# Patient Record
Sex: Male | Born: 1965 | Race: Black or African American | Hispanic: No | Marital: Married | Smoking: Former smoker
Health system: Southern US, Community
[De-identification: ages and names within clinical notes are randomized; demographics above are authoritative.]

## PROBLEM LIST (undated history)

## (undated) DIAGNOSIS — C801 Malignant (primary) neoplasm, unspecified: Secondary | ICD-10-CM

## (undated) DIAGNOSIS — K0889 Other specified disorders of teeth and supporting structures: Secondary | ICD-10-CM

## (undated) DIAGNOSIS — G8929 Other chronic pain: Secondary | ICD-10-CM

## (undated) DIAGNOSIS — M545 Low back pain, unspecified: Secondary | ICD-10-CM

## (undated) DIAGNOSIS — Z789 Other specified health status: Secondary | ICD-10-CM

## (undated) HISTORY — PX: INGUINAL HERNIA REPAIR: SUR1180

---

## 1998-07-14 ENCOUNTER — Emergency Department (HOSPITAL_COMMUNITY): Admission: EM | Admit: 1998-07-14 | Discharge: 1998-07-14 | Payer: Self-pay | Admitting: Emergency Medicine

## 1999-05-09 ENCOUNTER — Emergency Department (HOSPITAL_COMMUNITY): Admission: EM | Admit: 1999-05-09 | Discharge: 1999-05-09 | Payer: Self-pay | Admitting: Emergency Medicine

## 1999-05-09 ENCOUNTER — Encounter: Payer: Self-pay | Admitting: Emergency Medicine

## 2001-06-03 ENCOUNTER — Emergency Department (HOSPITAL_COMMUNITY): Admission: EM | Admit: 2001-06-03 | Discharge: 2001-06-03 | Payer: Self-pay

## 2002-05-02 ENCOUNTER — Emergency Department (HOSPITAL_COMMUNITY): Admission: EM | Admit: 2002-05-02 | Discharge: 2002-05-02 | Payer: Self-pay | Admitting: Emergency Medicine

## 2004-01-08 ENCOUNTER — Ambulatory Visit (HOSPITAL_COMMUNITY): Admission: RE | Admit: 2004-01-08 | Discharge: 2004-01-08 | Payer: Self-pay

## 2004-08-22 ENCOUNTER — Emergency Department (HOSPITAL_COMMUNITY): Admission: EM | Admit: 2004-08-22 | Discharge: 2004-08-22 | Payer: Self-pay | Admitting: Emergency Medicine

## 2005-11-11 ENCOUNTER — Emergency Department (HOSPITAL_COMMUNITY): Admission: EM | Admit: 2005-11-11 | Discharge: 2005-11-11 | Payer: Self-pay | Admitting: Emergency Medicine

## 2006-03-20 ENCOUNTER — Emergency Department (HOSPITAL_COMMUNITY): Admission: EM | Admit: 2006-03-20 | Discharge: 2006-03-20 | Payer: Self-pay | Admitting: Emergency Medicine

## 2012-02-11 ENCOUNTER — Encounter (HOSPITAL_COMMUNITY): Payer: Self-pay | Admitting: *Deleted

## 2012-02-11 ENCOUNTER — Emergency Department (HOSPITAL_COMMUNITY): Payer: Self-pay

## 2012-02-11 ENCOUNTER — Emergency Department (HOSPITAL_COMMUNITY)
Admission: EM | Admit: 2012-02-11 | Discharge: 2012-02-11 | Disposition: A | Discharge status: Home / Self Care | Payer: Self-pay | Attending: Emergency Medicine | Admitting: Emergency Medicine

## 2012-02-11 DIAGNOSIS — F17200 Nicotine dependence, unspecified, uncomplicated: Secondary | ICD-10-CM | POA: Insufficient documentation

## 2012-02-11 DIAGNOSIS — Y9389 Activity, other specified: Secondary | ICD-10-CM | POA: Insufficient documentation

## 2012-02-11 DIAGNOSIS — S91309A Unspecified open wound, unspecified foot, initial encounter: Secondary | ICD-10-CM | POA: Insufficient documentation

## 2012-02-11 DIAGNOSIS — Y929 Unspecified place or not applicable: Secondary | ICD-10-CM | POA: Insufficient documentation

## 2012-02-11 DIAGNOSIS — W230XXA Caught, crushed, jammed, or pinched between moving objects, initial encounter: Secondary | ICD-10-CM | POA: Insufficient documentation

## 2012-02-11 DIAGNOSIS — Z23 Encounter for immunization: Secondary | ICD-10-CM | POA: Insufficient documentation

## 2012-02-11 DIAGNOSIS — S91319A Laceration without foreign body, unspecified foot, initial encounter: Secondary | ICD-10-CM

## 2012-02-11 MED ORDER — TETANUS-DIPHTH-ACELL PERTUSSIS 5-2.5-18.5 LF-MCG/0.5 IM SUSP
0.5000 mL | Freq: Once | INTRAMUSCULAR | Status: AC
  Administered 2012-02-11: 0.5 mL via INTRAMUSCULAR
  Filled 2012-02-11: qty 0.5

## 2012-02-11 MED ORDER — HYDROCODONE-ACETAMINOPHEN 5-325 MG PO TABS: 1.0000 | ORAL_TABLET | Freq: Four times a day (QID) | ORAL | Status: DC | PRN

## 2012-02-11 MED ORDER — BACITRACIN ZINC 500 UNIT/GM EX OINT
TOPICAL_OINTMENT | CUTANEOUS | Status: AC
  Filled 2012-02-11: qty 0.9

## 2012-02-11 MED ORDER — IBUPROFEN 800 MG PO TABS: 800.0000 mg | ORAL_TABLET | Freq: Three times a day (TID) | ORAL | Status: DC | PRN

## 2012-02-11 NOTE — ED Notes (Signed)
ZOX:[id number]<AV> Expected date:<BR> Expected time:<BR> Means of arrival:Ambulance<BR> Comments:<BR> Foot lac

## 2012-02-11 NOTE — ED Provider Notes (Signed)
Medical screening examination/treatment/procedure(s) were performed by non-physician practitioner and as supervising physician I was immediately available for consultation/collaboration.   Celene Kras, MD 02/11/12 [phone]

## 2012-02-11 NOTE — ED Notes (Signed)
PA at bedside.

## 2012-02-11 NOTE — ED Provider Notes (Signed)
History     CSN: [id number]  Arrival date & time 02/11/12  1931   First MD Initiated Contact with Patient 02/11/12 2012      Chief Complaint  Patient presents with  . Extremity Laceration   HPI  History provided by the patient and significant other. Patient is a 47 year old male with no significant PMH who presents with right foot injury and laceration. Injury occurred prior to arrival. Patient was working on a car and he thinks the car jack malfunctioned causing the car to fall. Patient states there was a heavy part of the axilla over his foot which required other people to help lift and removed. Following injury patient had a small circular puncture wound to the top of the foot with small amounts of bleeding. He denied having any weakness or numbness in the foot. He was ambulatory after the injury. Denies any other injury or complaint. Patient did not use any treatment for symptoms and came straight to the ER. Patient is unsure of his last tetanus shot.    History reviewed. No pertinent past medical history.  History reviewed. No pertinent past surgical history.  History reviewed. No pertinent family history.  History  Substance Use Topics  . Smoking status: Light Tobacco Smoker    Types: Cigarettes  . Smokeless tobacco: Never Used  . Alcohol Use: 8.4 oz/week    14 Cans of beer per week      Review of Systems  Neurological: Negative for weakness and numbness.  All other systems reviewed and are negative.    Allergies  Review of patient's allergies indicates no known allergies.  Home Medications  No current outpatient prescriptions on file.  BP 144/99  Pulse 69  Temp 98.6 F (37 C) (Oral)  Resp 20  SpO2 98%  Physical Exam  Nursing note and vitals reviewed. Constitutional: He is oriented to person, place, and time. He appears well-developed and well-nourished. No distress.  HENT:  Head: Normocephalic.  Cardiovascular: Normal rate and regular rhythm.     Pulmonary/Chest: Effort normal and breath sounds normal.  Musculoskeletal:       There is a 1 cm circular puncture type laceration to the proximal medial right dorsal foot. No active bleeding. There is some generalized swelling of the dorsal foot. No circumferential swelling. Normal movement in toes with normal sensation and cap refill. Normal dorsal pedal pulse. No gross deformities.  Neurological: He is alert and oriented to person, place, and time.  Skin: Skin is warm.  Psychiatric: He has a normal mood and affect. His behavior is normal.    ED Course  Procedures   LACERATION REPAIR Performed by: Angus Seller Authorized by: Angus Seller Consent: Verbal consent obtained. Risks and benefits: risks, benefits and alternatives were discussed Consent given by: patient Patient identity confirmed: provided demographic data Prepped and Draped in normal sterile fashion Wound explored  Laceration Location: Dorsal right foot  Laceration Length: 1 cm  No Foreign Bodies seen or palpated  Anesthesia: local infiltration  Local anesthetic: lidocaine 2% with epinephrine  Anesthetic total: 8 ml  Irrigation method: syringe Amount of cleaning: standard  Skin closure: Skin with 4-0 Prolene   Number of sutures: 5  Technique: Simple ruptured   Patient tolerance: Patient tolerated the procedure well with no immediate complications.     Dg Foot Complete Right  02/11/2012  *RADIOLOGY REPORT*  Clinical Data: Injury to the right foot. History of laceration.  RIGHT FOOT COMPLETE - 3+ VIEW  Comparison: None  Findings:  Three views of the right foot were obtained.  There is soft tissue edema along the medial aspect of the foot and ankle. Findings are compatible with history of laceration.  Degenerative changes along the dorsal aspect of the talonavicular joint.  There is no evidence for acute fracture or dislocation.  Mild degenerative changes involving the first MTP joint.  IMPRESSION: No  acute bony abnormality in the right foot.  Soft tissue swelling consistent with history of laceration.   Original Report Authenticated By: Richarda Overlie, M.D.      1. Laceration of foot       MDM  8:30 PM patient seen and evaluated. Patient currently resting comfortably denies any significant pains and uses pain medications at this time. Patient unsure of last tetanus and we will update today.        Angus Seller, [place] 02/11/12 2211

## 2012-02-11 NOTE — ED Notes (Signed)
Writer cleaned wound with normal saline, and gauze.
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
## 2012-02-23 ENCOUNTER — Encounter (HOSPITAL_COMMUNITY): Payer: Self-pay

## 2012-02-23 ENCOUNTER — Emergency Department (INDEPENDENT_AMBULATORY_CARE_PROVIDER_SITE_OTHER)
Admission: EM | Admit: 2012-02-23 | Discharge: 2012-02-23 | Disposition: A | Discharge status: Home / Self Care | Payer: Self-pay | Source: Home / Self Care | Attending: Family Medicine | Admitting: Family Medicine

## 2012-02-23 DIAGNOSIS — Z4802 Encounter for removal of sutures: Secondary | ICD-10-CM

## 2012-02-23 NOTE — ED Notes (Signed)
Removed four (4) additional sutures that pt found in/under scabbing.

## 2012-02-23 NOTE — ED Provider Notes (Signed)
History     CSN: [id number]  Arrival date & time 02/23/12  1733   First MD Initiated Contact with Patient 02/23/12 1745      Chief Complaint  Patient presents with  . Suture / Staple Removal    (Consider location/radiation/quality/duration/timing/severity/associated sxs/prior treatment) Patient is a 47 y.o. male presenting with suture removal. The history is provided by the patient.  Suture / Staple Removal  The sutures were placed 11 to 14 days ago. There has been no treatment since the wound repair. His temperature was unmeasured prior to arrival. There has been no drainage from the wound. There is no redness present. The pain has not changed. There is difficulty moving the extremity or digit due to pain.    History reviewed. No pertinent past medical history.  History reviewed. No pertinent past surgical history.  No family history on file.  History  Substance Use Topics  . Smoking status: Light Tobacco Smoker    Types: Cigarettes  . Smokeless tobacco: Never Used  . Alcohol Use: 8.4 oz/week    14 Cans of beer per week      Review of Systems  Constitutional: Negative.   Skin: Positive for wound.    Allergies  Review of patient's allergies indicates no known allergies.  Home Medications   Current Outpatient Rx  Name  Route  Sig  Dispense  Refill  . HYDROCODONE-ACETAMINOPHEN 5-325 MG PO TABS   Oral   Take 1 tablet by mouth every 6 (six) hours as needed for pain.   10 tablet   0   . IBUPROFEN 800 MG PO TABS   Oral   Take 1 tablet (800 mg total) by mouth every 8 (eight) hours as needed for pain.   30 tablet   0     There were no vitals taken for this visit.  Physical Exam  Nursing note and vitals reviewed. Constitutional: He appears well-developed and well-nourished.  Musculoskeletal: He exhibits tenderness.  Skin: Skin is warm and dry.       3 stitches removed, dsd    ED Course  Procedures (including critical care time)  Labs Reviewed - No  data to display No results found.   1. Visit for suture removal       MDM          Linna Hoff, MD 02/24/12 [phone]

## 2012-02-23 NOTE — ED Notes (Signed)
Suture removal right foot

## 2012-02-25 ENCOUNTER — Telehealth (HOSPITAL_COMMUNITY): Payer: Self-pay | Admitting: *Deleted
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
## 2012-07-01 ENCOUNTER — Emergency Department (HOSPITAL_BASED_OUTPATIENT_CLINIC_OR_DEPARTMENT_OTHER): Payer: No Typology Code available for payment source

## 2012-07-01 ENCOUNTER — Emergency Department (HOSPITAL_BASED_OUTPATIENT_CLINIC_OR_DEPARTMENT_OTHER)
Admission: EM | Admit: 2012-07-01 | Discharge: 2012-07-01 | Disposition: A | Discharge status: Home / Self Care | Payer: No Typology Code available for payment source | Attending: Emergency Medicine | Admitting: Emergency Medicine

## 2012-07-01 ENCOUNTER — Encounter (HOSPITAL_BASED_OUTPATIENT_CLINIC_OR_DEPARTMENT_OTHER): Payer: Self-pay | Admitting: *Deleted

## 2012-07-01 DIAGNOSIS — Y92410 Unspecified street and highway as the place of occurrence of the external cause: Secondary | ICD-10-CM | POA: Insufficient documentation

## 2012-07-01 DIAGNOSIS — M25561 Pain in right knee: Secondary | ICD-10-CM

## 2012-07-01 DIAGNOSIS — IMO0002 Reserved for concepts with insufficient information to code with codable children: Secondary | ICD-10-CM | POA: Insufficient documentation

## 2012-07-01 DIAGNOSIS — S8990XA Unspecified injury of unspecified lower leg, initial encounter: Secondary | ICD-10-CM | POA: Insufficient documentation

## 2012-07-01 DIAGNOSIS — Y9389 Activity, other specified: Secondary | ICD-10-CM | POA: Insufficient documentation

## 2012-07-01 DIAGNOSIS — F17200 Nicotine dependence, unspecified, uncomplicated: Secondary | ICD-10-CM | POA: Insufficient documentation

## 2012-07-01 DIAGNOSIS — M549 Dorsalgia, unspecified: Secondary | ICD-10-CM

## 2012-07-01 MED ORDER — CYCLOBENZAPRINE HCL 10 MG PO TABS: 10.0000 mg | ORAL_TABLET | Freq: Two times a day (BID) | ORAL | Status: DC | PRN

## 2012-07-01 NOTE — ED Notes (Signed)
Patient transported to X-ray 
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
## 2012-07-02 NOTE — ED Provider Notes (Signed)
History/physical exam/procedure(s) were performed by non-physician practitioner and as supervising physician I was immediately available for consultation/collaboration. I have reviewed all notes and am in agreement with care and plan.   Hilario Quarry, MD 07/02/12 [phone]

## 2015-09-30 ENCOUNTER — Emergency Department (HOSPITAL_COMMUNITY)
Admission: EM | Admit: 2015-09-30 | Discharge: 2015-09-30 | Disposition: A | Discharge status: Home / Self Care | Payer: Self-pay | Attending: Emergency Medicine | Admitting: Emergency Medicine

## 2015-09-30 ENCOUNTER — Encounter (HOSPITAL_COMMUNITY): Payer: Self-pay | Admitting: Emergency Medicine

## 2015-09-30 DIAGNOSIS — I10 Essential (primary) hypertension: Secondary | ICD-10-CM

## 2015-09-30 DIAGNOSIS — K047 Periapical abscess without sinus: Secondary | ICD-10-CM

## 2015-09-30 DIAGNOSIS — F17210 Nicotine dependence, cigarettes, uncomplicated: Secondary | ICD-10-CM | POA: Insufficient documentation

## 2015-09-30 MED ORDER — HYDROCODONE-ACETAMINOPHEN 5-325 MG PO TABS: 1.0000 | ORAL_TABLET | ORAL | 0 refills | Status: DC | PRN

## 2015-09-30 MED ORDER — HYDROCODONE-ACETAMINOPHEN 5-325 MG PO TABS
2.0000 | ORAL_TABLET | Freq: Once | ORAL | Status: AC
Start: 2015-09-30 — End: 2015-09-30
  Administered 2015-09-30: 2 via ORAL
  Filled 2015-09-30: qty 2

## 2015-09-30 MED ORDER — AMOXICILLIN 500 MG PO CAPS: 500.0000 mg | ORAL_CAPSULE | Freq: Three times a day (TID) | ORAL | 0 refills | Status: DC

## 2015-09-30 NOTE — ED Triage Notes (Signed)
Patient c/o right lower posterior tooth cracked and c/o pain since yesterday that isnt relieved by ibuprofen.

## 2015-09-30 NOTE — ED Provider Notes (Signed)
[facility] DEPT Provider Note   CSN: [id number] Arrival date & time: 09/30/15  1016     History   Chief Complaint Chief Complaint  Patient presents with  . Dental Pain    HPI Tanner Gallagher is a 50 y.o. male 2 days dental pain. Dental decay from fractured tooth  (lower R 1st premolar).  He has had intermittent pain in her tooth over the past 2 days has become severe. He states that he has radiating pain from the tooth into his jaw and face. He denies any difficulty swallowing. He has been taking Motrin without relief. He denies fevers or chills.   HPI  History reviewed. No pertinent past medical history.  There are no active problems to display for this patient.   Past Surgical History:  Procedure Laterality Date  . HERNIA REPAIR         Home Medications    Prior to Admission medications   Medication Sig Start Date End Date Taking? Authorizing Provider  amoxicillin (AMOXIL) 500 MG capsule Take 1 capsule (500 mg total) by mouth 3 (three) times daily. 09/30/15   Margarita Mail, PA-C  cyclobenzaprine (FLEXERIL) 10 MG tablet Take 1 tablet (10 mg total) by mouth 2 (two) times daily as needed for muscle spasms. 07/01/12   Jennifer Piepenbrink, PA-C  HYDROcodone-acetaminophen (NORCO) 5-325 MG tablet Take 1-2 tablets by mouth every 4 (four) hours as needed. 09/30/15   Margarita Mail, PA-C  ibuprofen (ADVIL,MOTRIN) 800 MG tablet Take 1 tablet (800 mg total) by mouth every 8 (eight) hours as needed for pain. 02/11/12   Hazel Sams, PA-C    Family History No family history on file.  Social History Social History  Substance Use Topics  . Smoking status: Light Tobacco Smoker    Types: Cigarettes  . Smokeless tobacco: Never Used  . Alcohol use 8.4 oz/week    14 Cans of beer per week     Allergies   Review of patient's allergies indicates no known allergies.   Review of Systems Review of Systems  Ten systems reviewed and are negative for acute change, except as noted  in the HPI.   Physical Exam Updated Vital Signs BP 153/100 (BP Location: Right Arm)   Pulse 63   Temp 98 F (36.7 C) (Oral)   Resp 17   SpO2 99%   Physical Exam  Constitutional: He appears well-developed and well-nourished. No distress.  HENT:  Head: Normocephalic and atraumatic.  Mouth/Throat: Oropharynx is clear and moist.  First premolar is decayed below the, lying, there is gingival erythema and swelling, exquisitely tender to palpation without fluctuance. Oropharynx is clear and moist  Eyes: Conjunctivae and EOM are normal. Pupils are equal, round, and reactive to light. No scleral icterus.  Neck: Normal range of motion. Neck supple.  Cardiovascular: Normal rate, regular rhythm and normal heart sounds.   Pulmonary/Chest: Effort normal and breath sounds normal. No respiratory distress.  Abdominal: Soft. There is no tenderness.  Musculoskeletal: He exhibits no edema.  Neurological: He is alert.  Skin: Skin is warm and dry. He is not diaphoretic.  Psychiatric: His behavior is normal.  Nursing note and vitals reviewed.    ED Treatments / Results  Labs (all labs ordered are listed, but only abnormal results are displayed) Labs Reviewed - No data to display  EKG  EKG Interpretation None       Radiology No results found.  Procedures Procedures (including critical care time)  Medications Ordered in ED Medications  HYDROcodone-acetaminophen (NORCO/VICODIN)  5-325 MG per tablet 2 tablet (not administered)     Initial Impression / Assessment and Plan / ED Course  I have reviewed the triage vital signs and the nursing notes.  Pertinent labs & imaging results that were available during my care of the patient were reviewed by me and considered in my medical decision making (see chart for details).  Clinical Course    Patient with toothache.  No gross abscess.  Exam unconcerning for Ludwig's angina or spread of infection.  Will treat with penicillin and pain  medicine.  Urged patient to follow-up with dentist.     Final Clinical Impressions(s) / ED Diagnoses   Final diagnoses:  Dental infection    New Prescriptions New Prescriptions   AMOXICILLIN (AMOXIL) 500 MG CAPSULE    Take 1 capsule (500 mg total) by mouth 3 (three) times daily.   HYDROCODONE-ACETAMINOPHEN (NORCO) 5-325 MG TABLET    Take 1-2 tablets by mouth every 4 (four) hours as needed.     Margarita Mail, PA-C 09/30/15 1554    Isla Pence, MD 10/03/15 [phone]

## 2015-09-30 NOTE — Discharge Instructions (Signed)
You have been seen by your caregiver because of dental pain.  °SEEK MEDICAL ATTENTION IF: °The exam and treatment you received today has been provided on an emergency basis only. This is not a substitute for complete medical or dental care. If your problem worsens or new symptoms (problems) appear, and you are unable to arrange prompt follow-up care with your dentist, call or return to this location. °CALL YOUR DENTIST OR RETURN IMMEDIATELY IF you develop a fever, rash, difficulty breathing or swallowing, neck or facial swelling, or other potentially serious concerns. ° ° °[employer]  °[employer]  °[facility]  °[address]  °[place], [place] [address]  °Phone [phone]  °[facility] [employer] [facility] in [place], [place], exemplifies the Dental School?s vision to improve the health and quality of life of all [place]ians by creating leaders with a passion to care for the underserved and by leading the nation in community-based, service learning oral health education. °We are committed to offering comprehensive general dental services for adults, children and special needs patients in a safe, caring and professional setting. ° °Appointments: Our clinic is open Monday through Friday 8:00 a.m. until 5:00 p.m. The amount of time scheduled for an appointment depends on the patient?s specific needs. We ask that you keep your appointed time for care or provide 24-hour notice of all appointment changes. Parents or legal guardians must accompany minor children. ° °Payment for Services: Medicaid and other insurance plans are welcome. Payment for services is due when services are rendered and may be made by cash or credit card. If you have dental insurance, we will assist you with your claim submission.  ° °Emergencies: Emergency services will be provided Monday through Friday on a  walk-in basis. Please arrive early for emergency services. After hours emergency services will be provided for patients of record as required. ° °Services:  °Comprehensive General Dentistry  °Children?s Dentistry  °Oral Surgery - Extractions  °Root Canals  °Sealants and Tooth Colored Fillings  °Crowns and Bridges  °Dentures and Partial Dentures  °Implant Services  °Periodontal Services and Cleanings  °Cosmetic Tooth Whitening  °Digital Radiography  °3-D/[facility] Beam Imaging ° ° °

## 2015-10-02 ENCOUNTER — Telehealth (HOSPITAL_COMMUNITY): Payer: Self-pay

## 2015-10-02 NOTE — Telephone Encounter (Signed)
Pt calling lost bottle of Amoxicillin for tooth infection has taken 6 doses (2 days of 10 days).  Rx for 8 days of Amoxicillin called to [facility] [phone] and given to [facility].

## 2016-06-23 ENCOUNTER — Emergency Department (HOSPITAL_COMMUNITY)
Admission: EM | Admit: 2016-06-23 | Discharge: 2016-06-23 | Disposition: A | Discharge status: Home / Self Care | Payer: Self-pay | Attending: Emergency Medicine | Admitting: Emergency Medicine

## 2016-06-23 ENCOUNTER — Encounter (HOSPITAL_COMMUNITY): Payer: Self-pay | Admitting: Emergency Medicine

## 2016-06-23 DIAGNOSIS — K0889 Other specified disorders of teeth and supporting structures: Secondary | ICD-10-CM | POA: Insufficient documentation

## 2016-06-23 DIAGNOSIS — F17210 Nicotine dependence, cigarettes, uncomplicated: Secondary | ICD-10-CM | POA: Insufficient documentation

## 2016-06-23 MED ORDER — PENICILLIN V POTASSIUM 500 MG PO TABS: 500.0000 mg | ORAL_TABLET | Freq: Four times a day (QID) | ORAL | 0 refills | Status: DC
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
## 2016-06-23 NOTE — ED Provider Notes (Signed)
[facility] DEPT Provider Note   CSN: [id number] Arrival date & time: 06/23/16  0145     History   Chief Complaint Chief Complaint  Patient presents with  . Dental Pain    HPI Tanner Gallagher is a 51 y.o. male.  Patient presents to the emergency department with a dental complaint. Symptoms began worsening 2-3 days ago, but he has had the broken teeth and pain for "a while." The patient has tried to alleviate pain with OTC meds.  Pain rated at a 10/10, characterized as throbbing in nature and located right upper rear molar, which is cracked and broken. Patient denies fever, night sweats, chills, difficulty swallowing or opening mouth, SOB, nuchal rigidity or decreased ROM of neck.  Patient does not have a dentist and requests a resource guide at discharge.    The history is provided by the patient. No language interpreter was used.    History reviewed. No pertinent past medical history.  There are no active problems to display for this patient.   Past Surgical History:  Procedure Laterality Date  . HERNIA REPAIR         Home Medications    Prior to Admission medications   Medication Sig Start Date End Date Taking? Authorizing Provider  amoxicillin (AMOXIL) 500 MG capsule Take 1 capsule (500 mg total) by mouth 3 (three) times daily. 09/30/15   Margarita Mail, PA-C  cyclobenzaprine (FLEXERIL) 10 MG tablet Take 1 tablet (10 mg total) by mouth 2 (two) times daily as needed for muscle spasms. 07/01/12   Piepenbrink, Anderson Malta, PA-C  HYDROcodone-acetaminophen (NORCO) 5-325 MG tablet Take 1-2 tablets by mouth every 4 (four) hours as needed. 09/30/15   Harris, Vernie Shanks, PA-C  ibuprofen (ADVIL,MOTRIN) 800 MG tablet Take 1 tablet (800 mg total) by mouth every 8 (eight) hours as needed for pain. 02/11/12   Hazel Sams, PA-C    Family History History reviewed. No pertinent family history.  Social History Social History  Substance Use Topics  . Smoking status: Light Tobacco Smoker      Types: Cigarettes  . Smokeless tobacco: Never Used  . Alcohol use 8.4 oz/week    14 Cans of beer per week     Allergies   Patient has no known allergies.   Review of Systems Review of Systems  Constitutional: Negative for chills and fever.  HENT: Positive for dental problem. Negative for drooling.   Neurological: Negative for speech difficulty.  Psychiatric/Behavioral: Positive for sleep disturbance.     Physical Exam Updated Vital Signs BP (!) 138/91 (BP Location: Left Arm)   Pulse 84   Temp 98 F (36.7 C) (Oral)   Resp 17   Ht 5\' 9"  (1.753 m)   Wt 70.3 kg (155 lb)   SpO2 100%   BMI 22.89 kg/m   Physical Exam Physical Exam  Constitutional: Pt appears well-developed and well-nourished.  HENT:  Head: Normocephalic.  Right Ear: Tympanic membrane, external ear and ear canal normal.  Left Ear: Tympanic membrane, external ear and ear canal normal.  Nose: Nose normal. Right sinus exhibits no maxillary sinus tenderness and no frontal sinus tenderness. Left sinus exhibits no maxillary sinus tenderness and no frontal sinus tenderness.  Mouth/Throat: Uvula is midline, oropharynx is clear and moist and mucous membranes are normal. No oral lesions. No uvula swelling or lacerations. No oropharyngeal exudate, posterior oropharyngeal edema, posterior oropharyngeal erythema or tonsillar abscesses.  Poor dentition No gingival swelling, fluctuance or induration No gross abscess  No sublingual edema, tenderness to  palpation, or sign of Ludwig's angina, or deep space infection Pain at right upper rear molars Eyes: Conjunctivae are normal. Pupils are equal, round, and reactive to light. Right eye exhibits no discharge. Left eye exhibits no discharge.  Neck: Normal range of motion. Neck supple.  No stridor Handling secretions without difficulty No nuchal rigidity No cervical lymphadenopathy Cardiovascular: Normal rate, regular rhythm and normal heart sounds.   Pulmonary/Chest:  Effort normal. No respiratory distress.  Equal chest rise  Abdominal: Soft. Bowel sounds are normal. Pt exhibits no distension. There is no tenderness.  Lymphadenopathy: Pt has no cervical adenopathy.  Neurological: Pt is alert and oriented x 4  Skin: Skin is warm and dry.  Psychiatric: Pt has a normal mood and affect.  Nursing note and vitals reviewed.    ED Treatments / Results  Labs (all labs ordered are listed, but only abnormal results are displayed) Labs Reviewed - No data to display  EKG  EKG Interpretation None       Radiology No results found.  Procedures Procedures (including critical care time)  Medications Ordered in ED Medications - No data to display   Initial Impression / Assessment and Plan / ED Course  I have reviewed the triage vital signs and the nursing notes.  Pertinent labs & imaging results that were available during my care of the patient were reviewed by me and considered in my medical decision making (see chart for details).     Patient with dentalgia.  No abscess requiring immediate incision and drainage.  Exam not concerning for Ludwig's angina or pharyngeal abscess.  Will treat with penicillin. Pt instructed to follow-up with dentist.  Discussed return precautions. Pt safe for discharge.   Final Clinical Impressions(s) / ED Diagnoses   Final diagnoses:  Pain, dental    New Prescriptions New Prescriptions   No medications on file     Montine Circle, Hershal Coria 06/23/16 Elisha Headland, MD 06/23/16 [phone]

## 2017-05-24 ENCOUNTER — Other Ambulatory Visit: Payer: Self-pay

## 2017-05-24 ENCOUNTER — Encounter (HOSPITAL_BASED_OUTPATIENT_CLINIC_OR_DEPARTMENT_OTHER): Payer: Self-pay | Admitting: Emergency Medicine

## 2017-05-24 ENCOUNTER — Emergency Department (HOSPITAL_BASED_OUTPATIENT_CLINIC_OR_DEPARTMENT_OTHER)
Admission: EM | Admit: 2017-05-24 | Discharge: 2017-05-24 | Disposition: A | Discharge status: Home / Self Care | Payer: Self-pay | Attending: Emergency Medicine | Admitting: Emergency Medicine

## 2017-05-24 DIAGNOSIS — K047 Periapical abscess without sinus: Secondary | ICD-10-CM | POA: Insufficient documentation

## 2017-05-24 DIAGNOSIS — F17210 Nicotine dependence, cigarettes, uncomplicated: Secondary | ICD-10-CM | POA: Insufficient documentation

## 2017-05-24 DIAGNOSIS — Z79899 Other long term (current) drug therapy: Secondary | ICD-10-CM | POA: Insufficient documentation

## 2017-05-24 MED ORDER — NAPROXEN 500 MG PO TABS: 500.0000 mg | ORAL_TABLET | Freq: Two times a day (BID) | ORAL | 0 refills | Status: DC

## 2017-05-24 MED ORDER — CLINDAMYCIN HCL 300 MG PO CAPS: 300.0000 mg | ORAL_CAPSULE | Freq: Four times a day (QID) | ORAL | 0 refills | Status: DC

## 2017-05-24 NOTE — ED Provider Notes (Signed)
[facility] EMERGENCY DEPARTMENT Provider Note   CSN: [id number] Arrival date & time: 05/24/17  1837     History   Chief Complaint Chief Complaint  Patient presents with  . Dental Pain    HPI Tanner Gallagher is a 52 y.o. male.   Dental Pain   This is a recurrent problem. The current episode started 2 days ago. The problem occurs constantly. The problem has been gradually worsening. The pain is moderate. He has tried acetaminophen for the symptoms. The treatment provided no relief.    History reviewed. No pertinent past medical history.  There are no active problems to display for this patient.   Past Surgical History:  Procedure Laterality Date  . HERNIA REPAIR          Home Medications    Prior to Admission medications   Medication Sig Start Date End Date Taking? Authorizing Provider  amoxicillin (AMOXIL) 500 MG capsule Take 1 capsule (500 mg total) by mouth 3 (three) times daily. 09/30/15   Harris, Vernie Shanks, PA-C  clindamycin (CLEOCIN) 300 MG capsule Take 1 capsule (300 mg total) by mouth 4 (four) times daily. X 7 days 05/24/17   Filip Luten, Corene Cornea, MD  cyclobenzaprine (FLEXERIL) 10 MG tablet Take 1 tablet (10 mg total) by mouth 2 (two) times daily as needed for muscle spasms. 07/01/12   Piepenbrink, Anderson Malta, PA-C  HYDROcodone-acetaminophen (NORCO) 5-325 MG tablet Take 1-2 tablets by mouth every 4 (four) hours as needed. 09/30/15   Harris, Vernie Shanks, PA-C  ibuprofen (ADVIL,MOTRIN) 800 MG tablet Take 1 tablet (800 mg total) by mouth every 8 (eight) hours as needed for pain. 02/11/12   Hazel Sams, PA-C  naproxen (NAPROSYN) 500 MG tablet Take 1 tablet (500 mg total) by mouth 2 (two) times daily. 05/24/17   Jakell Trusty, Corene Cornea, MD  penicillin v potassium (VEETID) 500 MG tablet Take 1 tablet (500 mg total) by mouth 4 (four) times daily. 06/23/16   Montine Circle, PA-C    Family History History reviewed. No pertinent family history.  Social History Social History   Tobacco Use    . Smoking status: Light Tobacco Smoker    Types: Cigarettes  . Smokeless tobacco: Never Used  Substance Use Topics  . Alcohol use: Yes    Alcohol/week: 8.4 oz    Types: 14 Cans of beer per week  . Drug use: Yes    Types: Marijuana     Allergies   Patient has no known allergies.   Review of Systems Review of Systems  All other systems reviewed and are negative.    Physical Exam Updated Vital Signs BP (!) 132/95 (BP Location: Left Arm)   Pulse 60   Temp 98.2 F (36.8 C) (Oral)   Resp 20   Ht 5\' 9"  (1.753 m)   Wt 70.3 kg (155 lb)   SpO2 100%   BMI 22.89 kg/m   Physical Exam  Constitutional: He is oriented to person, place, and time. He appears well-developed and well-nourished.  HENT:  Head: Normocephalic and atraumatic.  Left lower jaw swelling.  Normal salivation bilaterally No obvious airway involvement.  No sublingual firmness.  Eyes: Conjunctivae and EOM are normal.  Neck: Normal range of motion.  Cardiovascular: Normal rate.  Pulmonary/Chest: Effort normal. No respiratory distress.  Abdominal: Soft. Bowel sounds are normal. He exhibits no distension.  Musculoskeletal: Normal range of motion.  Neurological: He is alert and oriented to person, place, and time. No cranial nerve deficit. Coordination normal.  Skin: Skin is warm  and dry.  Nursing note and vitals reviewed.    ED Treatments / Results  Labs (all labs ordered are listed, but only abnormal results are displayed) Labs Reviewed - No data to display  EKG None  Radiology No results found.  Procedures Procedures (including critical care time)  Medications Ordered in ED Medications - No data to display   Initial Impression / Assessment and Plan / ED Course  I have reviewed the triage vital signs and the nursing notes.  Pertinent labs & imaging results that were available during my care of the patient were reviewed by me and considered in my medical decision making (see chart for  details).     Likely dental infection without e/o complication. Airway intact. Doubt ludwigs. No dysphagia. Will treat with abx/dental follow up.  Final Clinical Impressions(s) / ED Diagnoses   Final diagnoses:  Dental infection    ED Discharge Orders        Ordered    clindamycin (CLEOCIN) 300 MG capsule  4 times daily     05/24/17 1931    naproxen (NAPROSYN) 500 MG tablet  2 times daily     05/24/17 1931       Lanie Schelling, Corene Cornea, MD 05/24/17 2003

## 2017-05-24 NOTE — ED Triage Notes (Signed)
Patient states that he has had pain to his left lower jaw and states the he noted the swelling today

## 2017-06-12 ENCOUNTER — Other Ambulatory Visit: Payer: Self-pay

## 2017-06-12 ENCOUNTER — Emergency Department (HOSPITAL_BASED_OUTPATIENT_CLINIC_OR_DEPARTMENT_OTHER)
Admission: EM | Admit: 2017-06-12 | Discharge: 2017-06-12 | Disposition: A | Discharge status: Home / Self Care | Payer: Self-pay | Attending: Emergency Medicine | Admitting: Emergency Medicine

## 2017-06-12 ENCOUNTER — Encounter (HOSPITAL_BASED_OUTPATIENT_CLINIC_OR_DEPARTMENT_OTHER): Payer: Self-pay

## 2017-06-12 DIAGNOSIS — F17210 Nicotine dependence, cigarettes, uncomplicated: Secondary | ICD-10-CM | POA: Insufficient documentation

## 2017-06-12 DIAGNOSIS — Z79899 Other long term (current) drug therapy: Secondary | ICD-10-CM | POA: Insufficient documentation

## 2017-06-12 DIAGNOSIS — K6289 Other specified diseases of anus and rectum: Secondary | ICD-10-CM | POA: Insufficient documentation

## 2017-06-12 MED ORDER — BELLADONNA ALKALOIDS-OPIUM 16.2-60 MG RE SUPP: 1.0000 | Freq: Three times a day (TID) | RECTAL | 0 refills | Status: DC | PRN

## 2017-06-12 NOTE — ED Triage Notes (Signed)
Pt c/o rectal pain and increased pain with bowel movement, denies hemorrhoids, denies bleeding, denies abscess, states he thinks he pulled something

## 2017-06-12 NOTE — ED Provider Notes (Signed)
[facility] EMERGENCY DEPARTMENT Provider Note   CSN: [id number] Arrival date & time: 06/12/17  1748     History   Chief Complaint Chief Complaint  Patient presents with  . Rectal Pain    HPI Tanner Gallagher is a 52 y.o. male.  HPI Patient presents with rectal pain. Onset was a few days ago, and since onset the pain has been increasing, is severe, sharp, burning, worse with defecation, the present at all times. No abdominal pain, no nausea, vomiting, diarrhea, urinary changes, scrotal swelling. Patient had one prior episode in the distant past, which was noted to be a fissure, according to him. During this illness he has recently obtained topical lidocaine and has been using Epson salt baths since yesterday, without appreciable change in his condition. Patient denies rectal penetration with anything. History reviewed. No pertinent past medical history.  There are no active problems to display for this patient.   Past Surgical History:  Procedure Laterality Date  . HERNIA REPAIR          Home Medications    Prior to Admission medications   Medication Sig Start Date End Date Taking? Authorizing Provider  amoxicillin (AMOXIL) 500 MG capsule Take 1 capsule (500 mg total) by mouth 3 (three) times daily. 09/30/15   Harris, Vernie Shanks, PA-C  clindamycin (CLEOCIN) 300 MG capsule Take 1 capsule (300 mg total) by mouth 4 (four) times daily. X 7 days 05/24/17   Mesner, Corene Cornea, MD  cyclobenzaprine (FLEXERIL) 10 MG tablet Take 1 tablet (10 mg total) by mouth 2 (two) times daily as needed for muscle spasms. 07/01/12   Piepenbrink, Anderson Malta, PA-C  HYDROcodone-acetaminophen (NORCO) 5-325 MG tablet Take 1-2 tablets by mouth every 4 (four) hours as needed. 09/30/15   Harris, Vernie Shanks, PA-C  ibuprofen (ADVIL,MOTRIN) 800 MG tablet Take 1 tablet (800 mg total) by mouth every 8 (eight) hours as needed for pain. 02/11/12   Hazel Sams, PA-C  naproxen (NAPROSYN) 500 MG tablet Take 1 tablet (500  mg total) by mouth 2 (two) times daily. 05/24/17   Mesner, Corene Cornea, MD  opium-belladonna (B&O SUPPRETTES) 16.2-60 MG suppository Place 1 suppository rectally every 8 (eight) hours as needed (rectal pain). 06/12/17   Carmin Muskrat, MD  penicillin v potassium (VEETID) 500 MG tablet Take 1 tablet (500 mg total) by mouth 4 (four) times daily. 06/23/16   Montine Circle, PA-C    Family History No family history on file.  Social History Social History   Tobacco Use  . Smoking status: Light Tobacco Smoker    Types: Cigarettes  . Smokeless tobacco: Never Used  Substance Use Topics  . Alcohol use: Yes    Alcohol/week: 8.4 oz    Types: 14 Cans of beer per week  . Drug use: Yes    Types: Marijuana     Allergies   Patient has no known allergies.   Review of Systems Review of Systems  Constitutional:       Per HPI, otherwise negative  HENT:       Per HPI, otherwise negative  Respiratory:       Per HPI, otherwise negative  Cardiovascular:       Per HPI, otherwise negative  Gastrointestinal: Negative for vomiting.  Endocrine:       Negative aside from HPI  Genitourinary:       Neg aside from HPI   Musculoskeletal:       Per HPI, otherwise negative  Skin: Negative.   Neurological: Negative for syncope.  Physical Exam Updated Vital Signs BP 125/76 (BP Location: Left Arm)   Pulse 92   Temp 99.6 F (37.6 C) (Oral)   Resp 18   Ht 5\' 9"  (1.753 m)   Wt 70.3 kg (155 lb)   SpO2 100%   BMI 22.89 kg/m   Physical Exam  Constitutional: He is oriented to person, place, and time. He appears well-developed. No distress.  HENT:  Head: Normocephalic and atraumatic.  Eyes: Conjunctivae and EOM are normal.  Cardiovascular: Normal rate and regular rhythm.  Pulmonary/Chest: Effort normal. No stridor. No respiratory distress.  Abdominal: He exhibits no distension.  Genitourinary:     Musculoskeletal: He exhibits no edema.  Neurological: He is alert and oriented to person, place,  and time.  Skin: Skin is warm and dry.  Psychiatric: He has a normal mood and affect.  Nursing note and vitals reviewed.    ED Treatments / Results   Procedures Procedures (including critical care time)  Medications Ordered in ED Medications - No data to display   Initial Impression / Assessment and Plan / ED Course  I have reviewed the triage vital signs and the nursing notes.  Pertinent labs & imaging results that were available during my care of the patient were reviewed by me and considered in my medical decision making (see chart for details).    This generally well-appearing male with no history of rectal penetration presents with rectal pain for several days per Patient acknowledges recent heavy exertion, lifting objects, but has no obvious hemorrhoid. Patient has no obvious abscess, no obvious bleeding, discharge, drainage from his rectum. There is some suspicion for seizure, versus early infection, but no drainable lesions. Patient was encouraged to continue using Epson salt baths, lidocaine topical medication and BO suppository and follow-up with gastroenterology as needed.  Final Clinical Impressions(s) / ED Diagnoses   Final diagnoses:  Rectal pain    ED Discharge Orders        Ordered    opium-belladonna (B&O SUPPRETTES) 16.2-60 MG suppository  Every 8 hours PRN     06/12/17 1949       Carmin Muskrat, MD 06/12/17 1953

## 2017-06-12 NOTE — Discharge Instructions (Addendum)
As discussed, please continue using the lidocaine, as well as Epson salt baths for relief. In addition, please obtain the prescription for suppositories.  If your pain does not improve, please be sure to follow-up with our colleagues. Otherwise return here for concerning changes in your condition.

## 2017-06-13 ENCOUNTER — Telehealth (HOSPITAL_BASED_OUTPATIENT_CLINIC_OR_DEPARTMENT_OTHER): Payer: Self-pay | Admitting: *Deleted

## 2017-06-13 NOTE — Telephone Encounter (Signed)
Call received from [facility] out-patient pharmacy requesting notation be made on pt's chart. He received Rx for B&O suppositories #12 from Dr Vanita Panda. However he was only able to afford #6 and he forfeited the remaining number prescribed.

## 2017-06-14 ENCOUNTER — Telehealth (HOSPITAL_BASED_OUTPATIENT_CLINIC_OR_DEPARTMENT_OTHER): Payer: Self-pay | Admitting: Emergency Medicine

## 2017-06-16 ENCOUNTER — Observation Stay (HOSPITAL_COMMUNITY): Payer: Self-pay | Admitting: Certified Registered"

## 2017-06-16 ENCOUNTER — Emergency Department (HOSPITAL_COMMUNITY): Payer: Self-pay

## 2017-06-16 ENCOUNTER — Other Ambulatory Visit: Payer: Self-pay

## 2017-06-16 ENCOUNTER — Inpatient Hospital Stay (HOSPITAL_COMMUNITY)
Admission: EM | Admit: 2017-06-16 | Discharge: 2017-06-19 | DRG: 395 | Disposition: A | Discharge status: Home / Self Care | Payer: Self-pay | Attending: Surgery | Admitting: Surgery

## 2017-06-16 ENCOUNTER — Encounter (HOSPITAL_COMMUNITY): Payer: Self-pay | Admitting: Emergency Medicine

## 2017-06-16 ENCOUNTER — Encounter (HOSPITAL_COMMUNITY): Admission: EM | Disposition: A | Discharge status: Home / Self Care | Payer: Self-pay | Source: Home / Self Care

## 2017-06-16 DIAGNOSIS — F17210 Nicotine dependence, cigarettes, uncomplicated: Secondary | ICD-10-CM | POA: Diagnosis present

## 2017-06-16 DIAGNOSIS — Z791 Long term (current) use of non-steroidal anti-inflammatories (NSAID): Secondary | ICD-10-CM

## 2017-06-16 DIAGNOSIS — K6289 Other specified diseases of anus and rectum: Secondary | ICD-10-CM | POA: Diagnosis present

## 2017-06-16 DIAGNOSIS — K611 Rectal abscess: Secondary | ICD-10-CM | POA: Diagnosis present

## 2017-06-16 DIAGNOSIS — K612 Anorectal abscess: Principal | ICD-10-CM | POA: Diagnosis present

## 2017-06-16 DIAGNOSIS — K769 Liver disease, unspecified: Secondary | ICD-10-CM | POA: Diagnosis present

## 2017-06-16 HISTORY — DX: Other specified health status: Z78.9

## 2017-06-16 HISTORY — PX: INCISION AND DRAINAGE PERIRECTAL ABSCESS: SHX1804

## 2017-06-16 HISTORY — PX: PROCTOSCOPY: SHX2266

## 2017-06-16 LAB — URINALYSIS, ROUTINE W REFLEX MICROSCOPIC
Bilirubin Urine: NEGATIVE
Glucose, UA: NEGATIVE mg/dL
Ketones, ur: 20 mg/dL — AB
NITRITE: POSITIVE — AB
PROTEIN: NEGATIVE mg/dL
Specific Gravity, Urine: 1.025 (ref 1.005–1.030)
WBC, UA: >50 WBC/hpf — ABNORMAL HIGH (ref 0–5)
pH: 6 (ref 5.0–8.0)

## 2017-06-16 LAB — CBC WITH DIFFERENTIAL/PLATELET
Abs Immature Granulocytes: 0.1 10*3/uL (ref 0.0–0.1)
Basophils Absolute: 0 10*3/uL (ref 0.0–0.1)
Basophils Relative: 0 %
EOS ABS: 0 10*3/uL (ref 0.0–0.7)
EOS PCT: 0 %
HEMATOCRIT: 38.2 % — AB (ref 39.0–52.0)
Hemoglobin: 13.2 g/dL (ref 13.0–17.0)
Immature Granulocytes: 1 %
LYMPHS ABS: 1.8 10*3/uL (ref 0.7–4.0)
LYMPHS PCT: 9 %
MCH: 31.1 pg (ref 26.0–34.0)
MCHC: 34.6 g/dL (ref 30.0–36.0)
MCV: 90.1 fL (ref 78.0–100.0)
MONO ABS: 2.2 10*3/uL — AB (ref 0.1–1.0)
MONOS PCT: 11 %
Neutro Abs: 16.8 10*3/uL — ABNORMAL HIGH (ref 1.7–7.7)
Neutrophils Relative %: 79 %
Platelets: 360 10*3/uL (ref 150–400)
RBC: 4.24 MIL/uL (ref 4.22–5.81)
RDW: 13.4 % (ref 11.5–15.5)
WBC: 21 10*3/uL — ABNORMAL HIGH (ref 4.0–10.5)

## 2017-06-16 LAB — I-STAT CG4 LACTIC ACID, ED: LACTIC ACID, VENOUS: 1.53 mmol/L (ref 0.5–1.9)

## 2017-06-16 LAB — COMPREHENSIVE METABOLIC PANEL
ALBUMIN: 2.6 g/dL — AB (ref 3.5–5.0)
ALT: 14 U/L — AB (ref 17–63)
AST: 23 U/L (ref 15–41)
Alkaline Phosphatase: 78 U/L (ref 38–126)
Anion gap: 10 (ref 5–15)
BUN: 10 mg/dL (ref 6–20)
CHLORIDE: 96 mmol/L — AB (ref 101–111)
CO2: 26 mmol/L (ref 22–32)
CREATININE: 0.97 mg/dL (ref 0.61–1.24)
Calcium: 8.7 mg/dL — ABNORMAL LOW (ref 8.9–10.3)
GFR calc Af Amer: >60 mL/min (ref >60)
GLUCOSE: 102 mg/dL — AB (ref 65–99)
POTASSIUM: 4.2 mmol/L (ref 3.5–5.1)
Sodium: 132 mmol/L — ABNORMAL LOW (ref 135–145)
Total Bilirubin: 0.7 mg/dL (ref 0.3–1.2)
Total Protein: 7.2 g/dL (ref 6.5–8.1)

## 2017-06-16 SURGERY — INCISION AND DRAINAGE, ABSCESS, PERIRECTAL
Anesthesia: General

## 2017-06-16 MED ORDER — DIPHENHYDRAMINE HCL 50 MG/ML IJ SOLN: 25.0000 mg | Freq: Four times a day (QID) | INTRAMUSCULAR | Status: DC | PRN

## 2017-06-16 MED ORDER — PIPERACILLIN-TAZOBACTAM 3.375 G IVPB
3.3750 g | Freq: Three times a day (TID) | INTRAVENOUS | Status: DC
  Administered 2017-06-16 – 2017-06-18 (×5): 3.375 g via INTRAVENOUS
  Filled 2017-06-16 (×7): qty 50

## 2017-06-16 MED ORDER — ONDANSETRON 4 MG PO TBDP: 4.0000 mg | ORAL_TABLET | Freq: Four times a day (QID) | ORAL | Status: DC | PRN

## 2017-06-16 MED ORDER — POTASSIUM CHLORIDE IN NACL 20-0.9 MEQ/L-% IV SOLN
INTRAVENOUS | Status: DC
  Administered 2017-06-16 – 2017-06-17 (×2): via INTRAVENOUS
  Filled 2017-06-16 (×3): qty 1000

## 2017-06-16 MED ORDER — DIPHENHYDRAMINE HCL 25 MG PO CAPS: 25.0000 mg | ORAL_CAPSULE | Freq: Four times a day (QID) | ORAL | Status: DC | PRN

## 2017-06-16 MED ORDER — MIDAZOLAM HCL 2 MG/2ML IJ SOLN
INTRAMUSCULAR | Status: DC | PRN
  Administered 2017-06-16: 2 mg via INTRAVENOUS

## 2017-06-16 MED ORDER — ONDANSETRON HCL 4 MG/2ML IJ SOLN
INTRAMUSCULAR | Status: AC
  Filled 2017-06-16: qty 2

## 2017-06-16 MED ORDER — KETOROLAC TROMETHAMINE 30 MG/ML IJ SOLN
INTRAMUSCULAR | Status: AC
  Filled 2017-06-16: qty 1

## 2017-06-16 MED ORDER — VANCOMYCIN HCL IN DEXTROSE 1-5 GM/200ML-% IV SOLN: 1000.0000 mg | Freq: Once | INTRAVENOUS | Status: DC

## 2017-06-16 MED ORDER — BUPIVACAINE-EPINEPHRINE (PF) 0.5% -1:200000 IJ SOLN
INTRAMUSCULAR | Status: AC
  Filled 2017-06-16: qty 30

## 2017-06-16 MED ORDER — VANCOMYCIN HCL 10 G IV SOLR
1500.0000 mg | Freq: Once | INTRAVENOUS | Status: AC
  Administered 2017-06-16: 1500 mg via INTRAVENOUS
  Filled 2017-06-16: qty 1500

## 2017-06-16 MED ORDER — OXYCODONE HCL 5 MG PO TABS
5.0000 mg | ORAL_TABLET | ORAL | Status: DC | PRN
  Administered 2017-06-17: 10 mg via ORAL
  Administered 2017-06-17: 5 mg via ORAL
  Administered 2017-06-17: 10 mg via ORAL
  Administered 2017-06-17 (×2): 5 mg via ORAL
  Administered 2017-06-18 – 2017-06-19 (×5): 10 mg via ORAL
  Filled 2017-06-16 (×2): qty 2
  Filled 2017-06-16 (×4): qty 1
  Filled 2017-06-16 (×5): qty 2
  Filled 2017-06-16: qty 1

## 2017-06-16 MED ORDER — IOHEXOL 300 MG/ML  SOLN
100.0000 mL | Freq: Once | INTRAMUSCULAR | Status: AC | PRN
  Administered 2017-06-16: 100 mL via INTRAVENOUS

## 2017-06-16 MED ORDER — PIPERACILLIN-TAZOBACTAM 3.375 G IVPB 30 MIN
3.3750 g | Freq: Once | INTRAVENOUS | Status: AC
  Administered 2017-06-16: 3.375 g via INTRAVENOUS
  Filled 2017-06-16: qty 50

## 2017-06-16 MED ORDER — ACETAMINOPHEN 325 MG PO TABS: 650.0000 mg | ORAL_TABLET | Freq: Four times a day (QID) | ORAL | Status: DC | PRN

## 2017-06-16 MED ORDER — ONDANSETRON HCL 4 MG/2ML IJ SOLN
4.0000 mg | INTRAMUSCULAR | Status: AC
  Administered 2017-06-16: 4 mg via INTRAVENOUS
  Filled 2017-06-16: qty 2

## 2017-06-16 MED ORDER — ONDANSETRON HCL 4 MG/2ML IJ SOLN: 4.0000 mg | Freq: Four times a day (QID) | INTRAMUSCULAR | Status: DC | PRN

## 2017-06-16 MED ORDER — ACETAMINOPHEN 650 MG RE SUPP: 650.0000 mg | Freq: Four times a day (QID) | RECTAL | Status: DC | PRN

## 2017-06-16 MED ORDER — ONDANSETRON HCL 4 MG/2ML IJ SOLN
INTRAMUSCULAR | Status: DC | PRN
  Administered 2017-06-16: 4 mg via INTRAVENOUS

## 2017-06-16 MED ORDER — 0.9 % SODIUM CHLORIDE (POUR BTL) OPTIME
TOPICAL | Status: DC | PRN
  Administered 2017-06-16: 1000 mL

## 2017-06-16 MED ORDER — FENTANYL CITRATE (PF) 100 MCG/2ML IJ SOLN
100.0000 ug | Freq: Once | INTRAMUSCULAR | Status: AC
  Administered 2017-06-16: 100 ug via INTRAVENOUS
  Filled 2017-06-16: qty 2

## 2017-06-16 MED ORDER — VANCOMYCIN HCL IN DEXTROSE 1-5 GM/200ML-% IV SOLN
1000.0000 mg | Freq: Two times a day (BID) | INTRAVENOUS | Status: DC
  Administered 2017-06-17 – 2017-06-18 (×3): 1000 mg via INTRAVENOUS
  Filled 2017-06-16 (×3): qty 200

## 2017-06-16 MED ORDER — FENTANYL CITRATE (PF) 250 MCG/5ML IJ SOLN
INTRAMUSCULAR | Status: AC
  Filled 2017-06-16: qty 5

## 2017-06-16 MED ORDER — DEXAMETHASONE SODIUM PHOSPHATE 10 MG/ML IJ SOLN
INTRAMUSCULAR | Status: DC | PRN
  Administered 2017-06-16: 10 mg via INTRAVENOUS

## 2017-06-16 MED ORDER — PROPOFOL 10 MG/ML IV BOLUS
INTRAVENOUS | Status: DC | PRN
  Administered 2017-06-16: 150 mg via INTRAVENOUS
  Administered 2017-06-16: 50 mg via INTRAVENOUS
  Administered 2017-06-16: 100 mg via INTRAVENOUS

## 2017-06-16 MED ORDER — BUPIVACAINE-EPINEPHRINE 0.5% -1:200000 IJ SOLN
INTRAMUSCULAR | Status: DC | PRN
  Administered 2017-06-16: 30 mL

## 2017-06-16 MED ORDER — PROPOFOL 10 MG/ML IV BOLUS
INTRAVENOUS | Status: AC
  Filled 2017-06-16: qty 20

## 2017-06-16 MED ORDER — MIDAZOLAM HCL 2 MG/2ML IJ SOLN
INTRAMUSCULAR | Status: AC
  Filled 2017-06-16: qty 2

## 2017-06-16 MED ORDER — DEXAMETHASONE SODIUM PHOSPHATE 10 MG/ML IJ SOLN
INTRAMUSCULAR | Status: AC
  Filled 2017-06-16: qty 1

## 2017-06-16 MED ORDER — LACTATED RINGERS IV SOLN
INTRAVENOUS | Status: DC
Start: 2017-06-16 — End: 2017-06-19
  Administered 2017-06-16: 17:00:00 via INTRAVENOUS

## 2017-06-16 MED ORDER — FENTANYL CITRATE (PF) 250 MCG/5ML IJ SOLN
INTRAMUSCULAR | Status: DC | PRN
  Administered 2017-06-16: 100 ug via INTRAVENOUS
  Administered 2017-06-16: 150 ug via INTRAVENOUS

## 2017-06-16 MED ORDER — MORPHINE SULFATE (PF) 4 MG/ML IV SOLN
2.0000 mg | INTRAVENOUS | Status: DC | PRN
  Administered 2017-06-17: 4 mg via INTRAVENOUS
  Filled 2017-06-16 (×2): qty 1

## 2017-06-16 MED ORDER — KETOROLAC TROMETHAMINE 30 MG/ML IJ SOLN
INTRAMUSCULAR | Status: DC | PRN
  Administered 2017-06-16: 30 mg via INTRAVENOUS

## 2017-06-16 MED ORDER — SODIUM CHLORIDE 0.9 % IV BOLUS
2000.0000 mL | Freq: Once | INTRAVENOUS | Status: AC
  Administered 2017-06-16: 2000 mL via INTRAVENOUS

## 2017-06-16 MED ORDER — LIDOCAINE 2% (20 MG/ML) 5 ML SYRINGE
INTRAMUSCULAR | Status: DC | PRN
  Administered 2017-06-16: 80 mg via INTRAVENOUS

## 2017-06-16 SURGICAL SUPPLY — 37 items
BLADE SURG 15 STRL LF DISP TIS (BLADE) IMPLANT
BLADE SURG 15 STRL SS (BLADE) ×4
CANISTER SUCT 3000ML PPV (MISCELLANEOUS) ×4 IMPLANT
COVER SURGICAL LIGHT HANDLE (MISCELLANEOUS) ×4 IMPLANT
DRAIN PENROSE 1/2X12 LTX STRL (WOUND CARE) ×2 IMPLANT
DRAPE UTILITY XL STRL (DRAPES) ×6 IMPLANT
DRSG PAD ABDOMINAL 8X10 ST (GAUZE/BANDAGES/DRESSINGS) ×4 IMPLANT
ELECT CAUTERY BLADE 6.4 (BLADE) ×4 IMPLANT
ELECT REM PT RETURN 9FT ADLT (ELECTROSURGICAL) ×4
ELECTRODE REM PT RTRN 9FT ADLT (ELECTROSURGICAL) ×2 IMPLANT
GAUZE PACKING IODOFORM 1 (PACKING) IMPLANT
GAUZE SPONGE 4X4 12PLY STRL (GAUZE/BANDAGES/DRESSINGS) ×4 IMPLANT
GEL ULTRASOUND 20GR AQUASONIC (MISCELLANEOUS) ×2 IMPLANT
GLOVE SURG SIGNA 7.5 PF LTX (GLOVE) ×4 IMPLANT
GOWN STRL REUS W/ TWL LRG LVL3 (GOWN DISPOSABLE) ×2 IMPLANT
GOWN STRL REUS W/ TWL XL LVL3 (GOWN DISPOSABLE) ×2 IMPLANT
GOWN STRL REUS W/TWL LRG LVL3 (GOWN DISPOSABLE) ×4
GOWN STRL REUS W/TWL XL LVL3 (GOWN DISPOSABLE) ×4
KIT BASIN OR (CUSTOM PROCEDURE TRAY) ×4 IMPLANT
KIT TURNOVER KIT B (KITS) ×4 IMPLANT
NS IRRIG 1000ML POUR BTL (IV SOLUTION) ×4 IMPLANT
PACK LITHOTOMY IV (CUSTOM PROCEDURE TRAY) ×4 IMPLANT
PAD ARMBOARD 7.5X6 YLW CONV (MISCELLANEOUS) ×4 IMPLANT
PENCIL BUTTON HOLSTER BLD 10FT (ELECTRODE) ×4 IMPLANT
SPONGE LAP 18X18 X RAY DECT (DISPOSABLE) ×4 IMPLANT
SUT SILK 2 0 SH (SUTURE) ×2 IMPLANT
SWAB COLLECTION DEVICE MRSA (MISCELLANEOUS) ×2 IMPLANT
SWAB CULTURE ESWAB REG 1ML (MISCELLANEOUS) ×2 IMPLANT
SYR BULB 3OZ (MISCELLANEOUS) ×4 IMPLANT
TAPE CLOTH SURG 4X10 WHT LF (GAUZE/BANDAGES/DRESSINGS) ×2 IMPLANT
TOWEL OR 17X24 6PK STRL BLUE (TOWEL DISPOSABLE) ×2 IMPLANT
TOWEL OR 17X26 10 PK STRL BLUE (TOWEL DISPOSABLE) ×4 IMPLANT
TRAY PROCTOSCOPIC FIBER OPTIC (SET/KITS/TRAYS/PACK) ×2 IMPLANT
TUBE CONNECTING 12'X1/4 (SUCTIONS) ×1
TUBE CONNECTING 12X1/4 (SUCTIONS) ×3 IMPLANT
UNDERPAD 30X30 (UNDERPADS AND DIAPERS) ×4 IMPLANT
YANKAUER SUCT BULB TIP NO VENT (SUCTIONS) ×4 IMPLANT

## 2017-06-16 NOTE — Anesthesia Preprocedure Evaluation (Addendum)
Anesthesia Evaluation  Patient identified by MRN, date of birth, ID band Patient awake    Reviewed: Allergy & Precautions, H&P , NPO status , Patient's Chart, lab work & pertinent test results  Airway Mallampati: II  TM Distance: >3 FB Neck ROM: Full    Dental no notable dental hx. (+) Teeth Intact, Dental Advisory Given, Chipped, Missing,    Pulmonary Current Smoker,    Pulmonary exam normal breath sounds clear to auscultation       Cardiovascular negative cardio ROS   Rhythm:Regular Rate:Normal     Neuro/Psych negative neurological ROS  negative psych ROS   GI/Hepatic negative GI ROS, Neg liver ROS,   Endo/Other  negative endocrine ROS  Renal/GU negative Renal ROS  negative genitourinary   Musculoskeletal   Abdominal   Peds  Hematology negative hematology ROS (+)   Anesthesia Other Findings   Reproductive/Obstetrics negative OB ROS                           Anesthesia Physical Anesthesia Plan  ASA: II  Anesthesia Plan: General   Post-op Pain Management:    Induction: Intravenous  PONV Risk Score and Plan: 2 and Ondansetron, Dexamethasone and Midazolam  Airway Management Planned: LMA  Additional Equipment:   Intra-op Plan:   Post-operative Plan: Extubation in OR  Informed Consent: I have reviewed the patients History and Physical, chart, labs and discussed the procedure including the risks, benefits and alternatives for the proposed anesthesia with the patient or authorized representative who has indicated his/her understanding and acceptance.   Dental advisory given  Plan Discussed with: CRNA  Anesthesia Plan Comments:        Anesthesia Quick Evaluation

## 2017-06-16 NOTE — Consult Note (Deleted)
Reason for Consult:Rectal pain CC:  Severe sharpe burning pain with defication Referring Physician:   [name] is an 52 y.o. male.  HPI: Pt with a history of rectal fissure. He started having pain about 5 days ago. He was seen in the ED at [facility] 06/13/17.  "No gross hemorrhoid, tender to palpation posterior anus, without appreciable mass, lesion, abscess.   There is a small tear in the tissue, less than 1 cm."  He was treated with B&O suppositories and discharged.  He could not get the suppositories till the next day, and he stayed in bed after starting them.  By Sunday 5/27 he had more swelling.  Pt returns to ED here at [facility] with rectal pain and swelling.  He notes suppositories made it worse, he has used 3 of them.  He is unable to sit down, no bleeding. He cannot sit down now and there is now drainage from his rectum that he describes as purulent.  He cannot eat secondary to the pain, urine output is reported to be down and he has never had anything like this before.  Last meal was yesterday.  He had a rectal fissure diagnosis by someone here at [facility] 10-12 years ago.   Work up in the ED shows he a afebrile, BP is in the low normal range.  WBC is 21K, H/H OK.  Na 132, creatinine is normal. Lactate is normal.  CT scan shows what looks like a 6 x 2 cm abscess on the right with air in the fluid collection.  We are ask to see.  CT results:  Large circumferential perianal abscess with gas extending superiorly on the right more than left to the level of the pelvic floor. The abscess measures up to 8 cm. The underlying anus is distorted with mass not excluded. 2. 13 mm nonspecific lesion in the right liver. Abscess is a consideration given #1. If a mass is discovered at exam metastasis would be the primary concern.   History reviewed. No pertinent past medical history.  Past Surgical History:  Procedure Laterality Date  . HERNIA REPAIR      No family history on file.  Social History:  reports  that he has been smoking cigarettes.  He has never used smokeless tobacco. He reports that he drinks about 8.4 oz of alcohol per week. He reports that he has current or past drug history.  Drugs: Marijuana almost daily ETOH:Social Cigarettes/Tobaccco:  <1PPD since age 33 Married, wife is with him, He is a Curator.    Allergies: No Known Allergies  Medications:  Prior to Admission:  Prior to Admission medications   Medication Sig Start Date End Date Taking? Authorizing Provider  acetaminophen (TYLENOL) 500 MG tablet Take 1,000 mg by mouth every 6 (six) hours as needed.   Yes [provider]  naproxen (NAPROSYN) 500 MG tablet Take 1 tablet (500 mg total) by mouth 2 (two) times daily. 05/24/17  Yes Mesner, Corene Cornea, MD  opium-belladonna (B&O SUPPRETTES) 16.2-60 MG suppository Place 1 suppository rectally every 8 (eight) hours as needed (rectal pain). 06/12/17  Yes Carmin Muskrat, MD  amoxicillin (AMOXIL) 500 MG capsule Take 1 capsule (500 mg total) by mouth 3 (three) times daily. Patient not taking: Reported on 06/16/2017 09/30/15   Margarita Mail, PA-C  clindamycin (CLEOCIN) 300 MG capsule Take 1 capsule (300 mg total) by mouth 4 (four) times daily. X 7 days Patient not taking: Reported on 06/16/2017 05/24/17   Mesner, Corene Cornea, MD  cyclobenzaprine [facility])  10 MG tablet Take 1 tablet (10 mg total) by mouth 2 (two) times daily as needed for muscle spasms. Patient not taking: Reported on 06/16/2017 07/01/12   Piepenbrink, Anderson Malta, PA-C  HYDROcodone-acetaminophen (NORCO) 5-325 MG tablet Take 1-2 tablets by mouth every 4 (four) hours as needed. Patient not taking: Reported on 06/16/2017 09/30/15   Margarita Mail, PA-C  ibuprofen (ADVIL,MOTRIN) 800 MG tablet Take 1 tablet (800 mg total) by mouth every 8 (eight) hours as needed for pain. Patient not taking: Reported on 06/16/2017 02/11/12   Hazel Sams, PA-C  penicillin v potassium (VEETID) 500 MG tablet Take 1 tablet (500 mg total) by mouth 4 (four)  times daily. Patient not taking: Reported on 06/16/2017 06/23/16   Montine Circle, PA-C    Scheduled: . fentaNYL (SUBLIMAZE) injection  100 mcg Intravenous Once   Continuous: . piperacillin-tazobactam (ZOSYN)  IV    . vancomycin 1,500 mg (06/16/17 1345)  . [START ON 06/17/2017] vancomycin     Anti-infectives (From admission, onward)   Start     Dose/Rate Route Frequency Ordered Stop   06/17/17 0300  vancomycin (VANCOCIN) IVPB 1000 mg/200 mL premix     1,000 mg 200 mL/hr over 60 Minutes Intravenous Every 12 hours 06/16/17 1504     06/16/17 2000  piperacillin-tazobactam (ZOSYN) IVPB 3.375 g     3.375 g 12.5 mL/hr over 240 Minutes Intravenous Every 8 hours 06/16/17 1504     06/16/17 1530  vancomycin (VANCOCIN) 1,500 mg in sodium chloride 0.9 % 500 mL IVPB     1,500 mg 250 mL/hr over 120 Minutes Intravenous  Once 06/16/17 1300     06/16/17 1300  piperacillin-tazobactam (ZOSYN) IVPB 3.375 g     3.375 g 100 mL/hr over 30 Minutes Intravenous  Once 06/16/17 1255 06/16/17 1416   06/16/17 1300  vancomycin (VANCOCIN) IVPB 1000 mg/200 mL premix  Status:  Discontinued     1,000 mg 200 mL/hr over 60 Minutes Intravenous  Once 06/16/17 1255 06/16/17 1300      Results for orders placed or performed during the hospital encounter of 06/16/17 (from the past 48 hour(s))  Comprehensive metabolic panel     Status: Abnormal   Collection Time: 06/16/17 12:55 PM  Result Value Ref Range   Sodium 132 (L) 135 - 145 mmol/L   Potassium 4.2 3.5 - 5.1 mmol/L   Chloride 96 (L) 101 - 111 mmol/L   CO2 26 22 - 32 mmol/L   Glucose, Bld 102 (H) 65 - 99 mg/dL   BUN 10 6 - 20 mg/dL   Creatinine, Ser 0.97 0.61 - 1.24 mg/dL   Calcium 8.7 (L) 8.9 - 10.3 mg/dL   Total Protein 7.2 6.5 - 8.1 g/dL   Albumin 2.6 (L) 3.5 - 5.0 g/dL   AST 23 15 - 41 U/L   ALT 14 (L) 17 - 63 U/L   Alkaline Phosphatase 78 38 - 126 U/L   Total Bilirubin 0.7 0.3 - 1.2 mg/dL   GFR calc non Af Amer >60 >60 mL/min   GFR calc Af Amer >60 >60  mL/min    Comment: (NOTE) The eGFR has been calculated using the CKD EPI equation. This calculation has not been validated in all clinical situations. eGFR's persistently <60 mL/min signify possible Chronic Kidney Disease.    Anion gap 10 5 - 15    Comment: Performed at [facility] [address] [place], [place] [address]  CBC WITH DIFFERENTIAL     Status: Abnormal  Collection Time: 06/16/17 12:55 PM  Result Value Ref Range   WBC 21.0 (H) 4.0 - 10.5 K/uL   RBC 4.24 4.22 - 5.81 MIL/uL   Hemoglobin 13.2 13.0 - 17.0 g/dL   HCT 38.2 (L) 39.0 - 52.0 %   MCV 90.1 78.0 - 100.0 fL   MCH 31.1 26.0 - 34.0 pg   MCHC 34.6 30.0 - 36.0 g/dL   RDW 13.4 11.5 - 15.5 %   Platelets 360 150 - 400 K/uL   Neutrophils Relative % 79 %   Neutro Abs 16.8 (H) 1.7 - 7.7 K/uL   Lymphocytes Relative 9 %   Lymphs Abs 1.8 0.7 - 4.0 K/uL   Monocytes Relative 11 %   Monocytes Absolute 2.2 (H) 0.1 - 1.0 K/uL   Eosinophils Relative 0 %   Eosinophils Absolute 0.0 0.0 - 0.7 K/uL   Basophils Relative 0 %   Basophils Absolute 0.0 0.0 - 0.1 K/uL   Immature Granulocytes 1 %   Abs Immature Granulocytes 0.1 0.0 - 0.1 K/uL    Comment: Performed at [facility], [address] [place], [place] [address]  I-Stat CG4 Lactic Acid, ED  (not at  [facility])     Status: None   Collection Time: 06/16/17  1:44 PM  Result Value Ref Range   Lactic Acid, Venous 1.53 0.5 - 1.9 mmol/L    No results found.  Review of Systems  Constitutional: Positive for fever and weight loss (not really sure). Negative for chills.  HENT: Negative.   Eyes: Negative.   Respiratory: Negative.   Cardiovascular: Negative.   Gastrointestinal: Negative for abdominal pain, blood in stool, constipation, diarrhea, heartburn, melena, nausea and vomiting.  Genitourinary: Negative.   Musculoskeletal: Negative.   Skin: Negative.   Neurological: Negative.   Endo/Heme/Allergies: Negative.   Psychiatric/Behavioral: Negative.    Blood  pressure 100/66, pulse 92, temperature 98.5 F (36.9 C), temperature source Oral, resp. rate 16, SpO2 100 %. Physical Exam  Constitutional: He is oriented to person, place, and time. He appears well-developed and well-nourished. No distress.  HENT:  Head: Normocephalic and atraumatic.  Mouth/Throat: Oropharynx is clear and moist. No oropharyngeal exudate.  Eyes: Right eye exhibits no discharge. Left eye exhibits no discharge. No scleral icterus.  Pupils are equal  Neck: Normal range of motion. Neck supple. No JVD present. No tracheal deviation present. No thyromegaly present.  Cardiovascular: Normal rate, regular rhythm, normal heart sounds and intact distal pulses.  No murmur heard. Slightly tachycardic  Respiratory: Effort normal and breath sounds normal. No respiratory distress. He has no wheezes. He has no rales. He exhibits no tenderness.  GI: Soft. Bowel sounds are normal. He exhibits no distension and no mass. There is no tenderness. There is no rebound and no guarding.  Genitourinary:  Genitourinary Comments: He has a visible perirectal swelling on the right, with purulent drainage.  He has so much pain he cannot lie flat.  Musculoskeletal: Normal range of motion. He exhibits no edema or tenderness.  Lymphadenopathy:    He has no cervical adenopathy.  Neurological: He is alert and oriented to person, place, and time. No cranial nerve deficit.  Skin: Skin is warm and dry. No rash noted. He is not diaphoretic. No erythema. No pallor.  Psychiatric: His behavior is normal. Judgment and thought content normal.    Assessment/Plan: 8 cm perirectal abscess with gas  13 mm nonspecific lesion right liver Tobacco/marijuana use   Plan:  He has been seen by Dr.  Ninfa Linden and he will take him to the OR ASAP for I&D of perirectal exam and exam under anesthesia.  Admit to observation, IV fluids and antibiotics.      Akeria Hedstrom 06/16/2017, 3:20 PM

## 2017-06-16 NOTE — ED Provider Notes (Signed)
[facility] EMERGENCY DEPARTMENT Provider Note   CSN: [id number] Arrival date & time: 06/16/17  0720     History   Chief Complaint Chief Complaint  Patient presents with  . Rectal Pain    HPI Tanner Gallagher is a 52 y.o. male who presents the emergency department with chief complaint of rectal pain.  Patient was seen on Friday, 06/12/2017.  Patient states that he was diagnosed with an anal fissure and discharged with belladonna and opium suppositories.  Patient states that they were extremely expensive and he was only able to afford half the prescription and that alone cost $200.  He states that he has used 3 of them and has 3 left.  He has also been supplementing with Tylenol and Motrin.  Sunday, 3 days ago the patient began having swelling in his rectum and anus with worsening severe pain, anorexia, chills.  He noticed drainage from his rectum which was purulent.  He has had decreased urinary output but states that he has been into much pain to move or feed himself much.  He has never had anything like this before.  HPI  History reviewed. No pertinent past medical history.  There are no active problems to display for this patient.   Past Surgical History:  Procedure Laterality Date  . HERNIA REPAIR          Home Medications    Prior to Admission medications   Medication Sig Start Date End Date Taking? Authorizing Provider  acetaminophen (TYLENOL) 500 MG tablet Take 1,000 mg by mouth every 6 (six) hours as needed.   Yes [provider]  naproxen (NAPROSYN) 500 MG tablet Take 1 tablet (500 mg total) by mouth 2 (two) times daily. 05/24/17  Yes Mesner, Corene Cornea, MD  opium-belladonna (B&O SUPPRETTES) 16.2-60 MG suppository Place 1 suppository rectally every 8 (eight) hours as needed (rectal pain). 06/12/17  Yes Carmin Muskrat, MD  amoxicillin (AMOXIL) 500 MG capsule Take 1 capsule (500 mg total) by mouth 3 (three) times daily. Patient not taking: Reported on  06/16/2017 09/30/15   Margarita Mail, PA-C  clindamycin (CLEOCIN) 300 MG capsule Take 1 capsule (300 mg total) by mouth 4 (four) times daily. X 7 days Patient not taking: Reported on 06/16/2017 05/24/17   Mesner, Corene Cornea, MD  cyclobenzaprine (FLEXERIL) 10 MG tablet Take 1 tablet (10 mg total) by mouth 2 (two) times daily as needed for muscle spasms. Patient not taking: Reported on 06/16/2017 07/01/12   Piepenbrink, Anderson Malta, PA-C  HYDROcodone-acetaminophen (NORCO) 5-325 MG tablet Take 1-2 tablets by mouth every 4 (four) hours as needed. Patient not taking: Reported on 06/16/2017 09/30/15   Margarita Mail, PA-C  ibuprofen (ADVIL,MOTRIN) 800 MG tablet Take 1 tablet (800 mg total) by mouth every 8 (eight) hours as needed for pain. Patient not taking: Reported on 06/16/2017 02/11/12   Hazel Sams, PA-C  penicillin v potassium (VEETID) 500 MG tablet Take 1 tablet (500 mg total) by mouth 4 (four) times daily. Patient not taking: Reported on 06/16/2017 06/23/16   Montine Circle, PA-C    Family History No family history on file.  Social History Social History   Tobacco Use  . Smoking status: Light Tobacco Smoker    Types: Cigarettes  . Smokeless tobacco: Never Used  Substance Use Topics  . Alcohol use: Yes    Alcohol/week: 8.4 oz    Types: 14 Cans of beer per week  . Drug use: Yes    Types: Marijuana     Allergies  Patient has no known allergies.   Review of Systems Review of Systems Ten systems reviewed and are negative for acute change, except as noted in the HPI.    Physical Exam Updated Vital Signs BP 100/63   Pulse 95   Temp 98.5 F (36.9 C) (Oral)   Resp 16   SpO2 97%   Physical Exam  Constitutional: He appears well-developed and well-nourished. No distress.  HENT:  Head: Normocephalic and atraumatic.  Eyes: Pupils are equal, round, and reactive to light. Conjunctivae and EOM are normal. No scleral icterus.  Neck: Normal range of motion. Neck supple.  Cardiovascular:  Normal rate, regular rhythm and normal heart sounds.  Pulmonary/Chest: Effort normal and breath sounds normal. No respiratory distress.  Abdominal: Soft. There is no tenderness.  Genitourinary:  Genitourinary Comments: Exam is significantly limited due to severe pain.  I am unable to do an internal examination.  There is obvious purulent discharge from the rectum.  I am unable to locate the source as to see whether it is external abscess or from the anus.  The tissue surrounding the perineum, rectum and bilateral inferior gluteal region is exquisitely tender and indurated.  Musculoskeletal: He exhibits no edema.  Neurological: He is alert.  Skin: Skin is warm and dry. He is not diaphoretic.  Psychiatric: His behavior is normal.  Nursing note and vitals reviewed.    ED Treatments / Results  Labs (all labs ordered are listed, but only abnormal results are displayed) Labs Reviewed  CULTURE, BLOOD (ROUTINE X 2)  CULTURE, BLOOD (ROUTINE X 2)  COMPREHENSIVE METABOLIC PANEL  CBC WITH DIFFERENTIAL/PLATELET  URINALYSIS, ROUTINE W REFLEX MICROSCOPIC  I-STAT CG4 LACTIC ACID, ED    EKG None  Radiology No results found.  Procedures Procedures (including critical care time)  Medications Ordered in ED Medications  piperacillin-tazobactam (ZOSYN) IVPB 3.375 g (has no administration in time range)  sodium chloride 0.9 % bolus 2,000 mL (has no administration in time range)  ondansetron (ZOFRAN) injection 4 mg (has no administration in time range)  vancomycin (VANCOCIN) 1,500 mg in sodium chloride 0.9 % 500 mL IVPB (has no administration in time range)     Initial Impression / Assessment and Plan / ED Course  I have reviewed the triage vital signs and the nursing notes.  Pertinent labs & imaging results that were available during my care of the patient were reviewed by me and considered in my medical decision making (see chart for details).  Clinical Course as of Jun 17 2122  Tue Jun 16, 2017  1358 WBC(!): 21.0 [AH]  1547 Patient with    [AH]  2122 Nitrite(!): POSITIVE [AH]  2122 Leukocytes, UA(!): MODERATE [AH]  2122 WBC, UA(!): >50 [AH]  2122 Patient with perirectal abscess.  He appears to have a significant urinary tract infection.  He was started on broad-spectrum antibiotics both gram-positive and enteric coverage.  Patient with a white blood cell count of 21,000 and a left shift, hypotension and tachycardia.  Afebrile here in the emergency department.  He has been seen by our hospitalist service and admitted.  I have ordered urine culture.  Bacteria, UA(!): MANY [AH]    Clinical Course User Index [AH] Margarita Mail, PA-C      Final Clinical Impressions(s) / ED Diagnoses   Final diagnoses:  Perirectal abscess    ED Discharge Orders    None       Margarita Mail, PA-C 06/16/17 2226    Valarie Merino, MD 06/16/17  2252  

## 2017-06-16 NOTE — Op Note (Signed)
IRRIGATION AND DEBRIDEMENT PERIRECTAL ABSCESS, RIGID PROCTOSCOPY  Procedure Note  Tanner Gallagher 06/16/2017   Pre-op Diagnosis: Peri-rectal abscess     Post-op Diagnosis: same  Procedure(s): INCISION AND DRAINAGE PERIRECTAL ABSCESS RIGID PROCTOSCOPY  Surgeon(s): Coralie Keens, MD  Anesthesia: General  Staff:  Circulator: Shawnie Dapper, RN Scrub Person: Teschner, Mindy K, CST  Estimated Blood Loss: Minimal               Findings: Large horseshoe perirectal abscess.  Cultures were obtained.  There was no palpable anal or rectal mass and nothing visible on rigid proctoscopy suggesting a mass or malignancy  Procedure: The patient was brought to the operating room and identified as correct patient.  He was placed supine on the operating table and general anesthesia was induced.  His perianal area was then prepped and draped in the usual sterile fashion.  He already had an open wound on the right lateral perianal area draining purulence.  Open this wound further and obtain cultures.  This tracked anteriorly.  I then made another incision on the right perianal area and entered the abscess cavity as well and demonstrated communicating with the right side anteriorly.  I drained all the purulence.  I did digital examination of the anus and rectum and could not palpate no masses.  I then did a rigid proctoscopy and could not identify any masses in the unprepped rectum.  I then placed a half inch Penrose drain in each wound and sutured them in place with 2-0 silk sutures.  I then packed dry gauze into the wound as well.  I anesthetized the area circumferentially with Marcaine with epinephrine.  The patient tolerated procedure well.  All the counts were correct at the end of procedure.  The patient was then extubated in the operating room and taken in a stable condition to the recovery room.          [name]   Date: 06/16/2017  Time: 6:02 PM

## 2017-06-16 NOTE — Progress Notes (Signed)
Pharmacy Antibiotic Note  Tanner Gallagher is a 52 y.o. male admitted on 06/16/2017 with sepsis.  Pharmacy has been consulted for vancomycin and zosyn dosing. WBC 21. LA 1.53. SCr wnl. CrCl ~ 70-75 mL/min  Plan: -Vancomycin 1500 mg IV once, then vancomycin 1 gm IV Q 12 hours -Zosyn 3.375 gm IV once, then Zosyn 3.375 gm IV Q 8 hours  -Monitor cultures, clinical progress and VT at steady state      Temp (24hrs), Avg:98.5 F (36.9 C), Min:98.4 F (36.9 C), Max:98.5 F (36.9 C)  No results for input(s): WBC, CREATININE, LATICACIDVEN, VANCOTROUGH, VANCOPEAK, VANCORANDOM, GENTTROUGH, GENTPEAK, GENTRANDOM, TOBRATROUGH, TOBRAPEAK, TOBRARND, AMIKACINPEAK, AMIKACINTROU, AMIKACIN in the last 168 hours.  CrCl cannot be calculated (No order found.).    No Known Allergies  Antimicrobials this admission: Vanc 5/28 >>  Zosyn 5/28 >>   Dose adjustments this admission: None   Microbiology results: 5/28 BCx:    Thank you for allowing pharmacy to be a part of this patient's care.  Albertina Parr, PharmD., BCPS Clinical Pharmacist Clinical phone for 06/16/17 until 3:30pm: [phone] If after 3:30pm, please call main pharmacy at: [phone]

## 2017-06-16 NOTE — Anesthesia Procedure Notes (Signed)
Procedure Name: LMA Insertion Date/Time: 06/16/2017 5:35 PM Performed by: Barrington Ellison, CRNA Pre-anesthesia Checklist: Patient identified, Emergency Drugs available, Suction available and Patient being monitored Patient Re-evaluated:Patient Re-evaluated prior to induction Oxygen Delivery Method: Circle System Utilized Preoxygenation: Pre-oxygenation with 100% oxygen Induction Type: IV induction Ventilation: Mask ventilation without difficulty LMA: LMA inserted LMA Size: 4.0 Number of attempts: 1 Placement Confirmation: positive ETCO2 Tube secured with: Tape Dental Injury: Teeth and Oropharynx as per pre-operative assessment

## 2017-06-16 NOTE — ED Notes (Signed)
Report called to Short stay OR RN.  Will transport patient to short stay OR 37.

## 2017-06-16 NOTE — Transfer of Care (Signed)
Immediate Anesthesia Transfer of Care Note  Patient: Tanner Gallagher  Procedure(s) Performed: IRRIGATION AND DEBRIDEMENT PERIRECTAL ABSCESS (N/A ) RIGID PROCTOSCOPY  Patient Location: PACU  Anesthesia Type:General  Level of Consciousness: awake, alert  and oriented  Airway & Oxygen Therapy: Patient Spontanous Breathing  Post-op Assessment: Report given to RN  Post vital signs: Reviewed and stable  Last Vitals:  Vitals Value Taken Time  BP    Temp    Pulse    Resp    SpO2      Last Pain:  Vitals:   06/16/17 1533  TempSrc:   PainSc: 6          Complications: No apparent anesthesia complications

## 2017-06-16 NOTE — ED Triage Notes (Signed)
Pt arrives with rectal pain and swelling since 5/24 , [facility] and given suppositories for pain that pain states has made it worse and feels like his rectum is swelling. Pt unable to sit down. Denies any bleeding.

## 2017-06-16 NOTE — H&P (Signed)
Reason for Consult:Rectal pain, drainage from the rectum CC:  Severe sharpe burning pain with defecation Referring Physician:   [name] is an 52 y.o. male.  HPI: Pt with a history of rectal fissure. He started having pain about 5 days ago. He was seen in the ED at [facility] 06/13/17.  "No gross hemorrhoid, tender to palpation posterior anus, without appreciable mass, lesion, abscess.   There is a small tear in the tissue, less than 1 cm."  He was treated with B&O suppositories and discharged.  He could not get the suppositories till the next day, and he stayed in bed after starting them.  By Sunday 5/27 he had more swelling.  Pt returns to ED here at [facility] with rectal pain and swelling.  He notes suppositories made it worse, he has used 3 of them.  He is unable to sit down, no bleeding. He cannot sit down now and there is now drainage from his rectum that he describes as purulent.  He cannot eat secondary to the pain, urine output is reported to be down and he has never had anything like this before.  Last meal was yesterday.  He had a rectal fissure diagnosis by someone here at [facility] 10-12 years ago.   Work up in the ED shows he a afebrile, BP is in the low normal range.  WBC is 21K, H/H OK.  Na 132, creatinine is normal. Lactate is normal.  CT scan shows what looks like a 8 cm abscess on the right, more than the left with air in the fluid collection.  We are ask to see.  CT results:  Large circumferential perianal abscess with gas extending superiorly on the right more than left to the level of the pelvic floor. The abscess measures up to 8 cm. The underlying anus is distorted with mass not excluded. 13 mm nonspecific lesion in the right liver. Abscess is a consideration given #1. If a mass is discovered at exam metastasis would be the primary concern.   History reviewed. No pertinent past medical history.  Past Surgical History:  Procedure Laterality Date  . HERNIA REPAIR      No  family history on file.  Social History:  reports that he has been smoking cigarettes.  He has never used smokeless tobacco. He reports that he drinks about 8.4 oz of alcohol per week. He reports that he has current or past drug history.  Drugs: Marijuana almost daily ETOH:Social Cigarettes/Tobaccco:  <1PPD since age 24 Married, wife is with him, He is a Curator.    Allergies: No Known Allergies  Medications:  Prior to Admission:         Prior to Admission medications   Medication Sig Start Date End Date Taking? Authorizing Provider  acetaminophen (TYLENOL) 500 MG tablet Take 1,000 mg by mouth every 6 (six) hours as needed.   Yes [provider]  naproxen (NAPROSYN) 500 MG tablet Take 1 tablet (500 mg total) by mouth 2 (two) times daily. 05/24/17  Yes Mesner, Corene Cornea, MD  opium-belladonna (B&O SUPPRETTES) 16.2-60 MG suppository Place 1 suppository rectally every 8 (eight) hours as needed (rectal pain). 06/12/17  Yes Carmin Muskrat, MD  amoxicillin (AMOXIL) 500 MG capsule Take 1 capsule (500 mg total) by mouth 3 (three) times daily. Patient not taking: Reported on 06/16/2017 09/30/15   Margarita Mail, PA-C  clindamycin (CLEOCIN) 300 MG capsule Take 1 capsule (300 mg total) by mouth 4 (four) times daily. X 7 days Patient not taking:  Reported on 06/16/2017 05/24/17   Mesner, Corene Cornea, MD  cyclobenzaprine (FLEXERIL) 10 MG tablet Take 1 tablet (10 mg total) by mouth 2 (two) times daily as needed for muscle spasms. Patient not taking: Reported on 06/16/2017 07/01/12   Piepenbrink, Anderson Malta, PA-C  HYDROcodone-acetaminophen (NORCO) 5-325 MG tablet Take 1-2 tablets by mouth every 4 (four) hours as needed. Patient not taking: Reported on 06/16/2017 09/30/15   Margarita Mail, PA-C  ibuprofen (ADVIL,MOTRIN) 800 MG tablet Take 1 tablet (800 mg total) by mouth every 8 (eight) hours as needed for pain. Patient not taking: Reported on 06/16/2017 02/11/12   Hazel Sams, PA-C  penicillin v  potassium (VEETID) 500 MG tablet Take 1 tablet (500 mg total) by mouth 4 (four) times daily. Patient not taking: Reported on 06/16/2017 06/23/16   Montine Circle, PA-C    Scheduled: . fentaNYL (SUBLIMAZE) injection  100 mcg Intravenous Once   Continuous: . piperacillin-tazobactam (ZOSYN)  IV    . vancomycin 1,500 mg (06/16/17 1345)  . [START ON 06/17/2017] vancomycin                Anti-infectives (From admission, onward)   Start     Dose/Rate Route Frequency Ordered Stop   06/17/17 0300  vancomycin (VANCOCIN) IVPB 1000 mg/200 mL premix     1,000 mg 200 mL/hr over 60 Minutes Intravenous Every 12 hours 06/16/17 1504     06/16/17 2000  piperacillin-tazobactam (ZOSYN) IVPB 3.375 g     3.375 g 12.5 mL/hr over 240 Minutes Intravenous Every 8 hours 06/16/17 1504     06/16/17 1530  vancomycin (VANCOCIN) 1,500 mg in sodium chloride 0.9 % 500 mL IVPB     1,500 mg 250 mL/hr over 120 Minutes Intravenous  Once 06/16/17 1300     06/16/17 1300  piperacillin-tazobactam (ZOSYN) IVPB 3.375 g     3.375 g 100 mL/hr over 30 Minutes Intravenous  Once 06/16/17 1255 06/16/17 1416   06/16/17 1300  vancomycin (VANCOCIN) IVPB 1000 mg/200 mL premix  Status:  Discontinued     1,000 mg 200 mL/hr over 60 Minutes Intravenous  Once 06/16/17 1255 06/16/17 1300      LabResultsLast48Hours        Results for orders placed or performed during the hospital encounter of 06/16/17 (from the past 48 hour(s))  Comprehensive metabolic panel     Status: Abnormal   Collection Time: 06/16/17 12:55 PM  Result Value Ref Range   Sodium 132 (L) 135 - 145 mmol/L   Potassium 4.2 3.5 - 5.1 mmol/L   Chloride 96 (L) 101 - 111 mmol/L   CO2 26 22 - 32 mmol/L   Glucose, Bld 102 (H) 65 - 99 mg/dL   BUN 10 6 - 20 mg/dL   Creatinine, Ser 0.97 0.61 - 1.24 mg/dL   Calcium 8.7 (L) 8.9 - 10.3 mg/dL   Total Protein 7.2 6.5 - 8.1 g/dL   Albumin 2.6 (L) 3.5 - 5.0 g/dL   AST 23 15 - 41 U/L   ALT 14  (L) 17 - 63 U/L   Alkaline Phosphatase 78 38 - 126 U/L   Total Bilirubin 0.7 0.3 - 1.2 mg/dL   GFR calc non Af Amer >60 >60 mL/min   GFR calc Af Amer >60 >60 mL/min    Comment: (NOTE) The eGFR has been calculated using the CKD EPI equation. This calculation has not been validated in all clinical situations. eGFR's persistently <60 mL/min signify possible Chronic Kidney Disease.    Anion gap 10 5 -  15    Comment: Performed at [facility], [place] [address], [place], [place] [address]  CBC WITH DIFFERENTIAL     Status: Abnormal   Collection Time: 06/16/17 12:55 PM  Result Value Ref Range   WBC 21.0 (H) 4.0 - 10.5 K/uL   RBC 4.24 4.22 - 5.81 MIL/uL   Hemoglobin 13.2 13.0 - 17.0 g/dL   HCT 38.2 (L) 39.0 - 52.0 %   MCV 90.1 78.0 - 100.0 fL   MCH 31.1 26.0 - 34.0 pg   MCHC 34.6 30.0 - 36.0 g/dL   RDW 13.4 11.5 - 15.5 %   Platelets 360 150 - 400 K/uL   Neutrophils Relative % 79 %   Neutro Abs 16.8 (H) 1.7 - 7.7 K/uL   Lymphocytes Relative 9 %   Lymphs Abs 1.8 0.7 - 4.0 K/uL   Monocytes Relative 11 %   Monocytes Absolute 2.2 (H) 0.1 - 1.0 K/uL   Eosinophils Relative 0 %   Eosinophils Absolute 0.0 0.0 - 0.7 K/uL   Basophils Relative 0 %   Basophils Absolute 0.0 0.0 - 0.1 K/uL   Immature Granulocytes 1 %   Abs Immature Granulocytes 0.1 0.0 - 0.1 K/uL    Comment: Performed at [facility], [address], [place], [place] [address]  I-Stat CG4 Lactic Acid, ED  (not at  [facility])     Status: None   Collection Time: 06/16/17  1:44 PM  Result Value Ref Range   Lactic Acid, Venous 1.53 0.5 - 1.9 mmol/L      ImagingResults(Last48hours)  No results found.    Review of Systems  Constitutional: Positive for fever and weight loss (not really sure). Negative for chills.  HENT: Negative.   Eyes: Negative.   Respiratory: Negative.   Cardiovascular: Negative.   Gastrointestinal: Negative for abdominal pain, blood in stool, constipation,  diarrhea, heartburn, melena, nausea and vomiting.  Genitourinary: Negative.   Musculoskeletal: Negative.   Skin: Negative.   Neurological: Negative.   Endo/Heme/Allergies: Negative.   Psychiatric/Behavioral: Negative.    Blood pressure 100/66, pulse 92, temperature 98.5 F (36.9 C), temperature source Oral, resp. rate 16, SpO2 100 %.  He feels febrile, but oral temp is 98.7. Physical Exam  Constitutional: He is oriented to person, place, and time. He appears well-developed and well-nourished. No distress.  HENT:  Head: Normocephalic and atraumatic.  Mouth/Throat: Oropharynx is clear and moist. No oropharyngeal exudate.  Eyes: Right eye exhibits no discharge. Left eye exhibits no discharge. No scleral icterus.  Pupils are equal  Neck: Normal range of motion. Neck supple. No JVD present. No tracheal deviation present. No thyromegaly present.  Cardiovascular: Normal rate, regular rhythm, normal heart sounds and intact distal pulses.  No murmur heard. Slightly tachycardic  Respiratory: Effort normal and breath sounds normal. No respiratory distress. He has no wheezes. He has no rales. He exhibits no tenderness.  GI: Soft. Bowel sounds are normal. He exhibits no distension and no mass. There is no tenderness. There is no rebound and no guarding.  Genitourinary:  Genitourinary Comments: He has a visible perirectal swelling on the right, with purulent drainage.  He has so much pain he cannot lie flat.  Musculoskeletal: Normal range of motion. He exhibits no edema or tenderness.  Lymphadenopathy:    He has no cervical adenopathy.  Neurological: He is alert and oriented to person, place, and time. No cranial nerve deficit.  Skin: Skin is warm and dry. No rash noted. He is not  diaphoretic. No erythema. No pallor.  Psychiatric: His behavior is normal. Judgment and thought content normal.    Assessment/Plan: 8 cm perirectal abscess with gas  13 mm nonspecific lesion right  liver Tobacco/marijuana use   Plan:  He has been seen by Dr. Ninfa Linden and he will take him to the OR ASAP for I&D of perirectal exam and exam under anesthesia.  Admit to observation, IV fluids and antibiotics.      Tanner Gallagher 06/16/2017, 3:20 PM

## 2017-06-16 NOTE — Progress Notes (Signed)
Patient ID: Tanner Gallagher, male   DOB: [birth year], 52 y.o.   MRN: [id number]   I have seen and examined the patient and agree with the assessment and plans. This is a deep significant perirectal abscess which appears horseshoe.  This needs urgent incision and drainage in the operating room.  I would also do a proctoscopy to rule out a rectal mass given the findings on CT scan.  I discussed this with the patient and his family.  I discussed the risk which include but is not limited to bleeding, infection, having multiple drains, incontinence, the need for further procedures, postoperative recovery, etc.  They understand and agree to proceed with surgery which has been scheduled  Arsenio Schnorr A. Ninfa Linden  MD, FACS

## 2017-06-16 NOTE — ED Notes (Signed)
Patient transported to Short Stay OR room 37.  All belongings were placed with security, yellow papers given to [place]. Security valuables number: [id number]   Patient signed for valuables and aware of staff having paperwork.   Bed Control made aware of patient being transferred to OR with bed at this time.

## 2017-06-17 ENCOUNTER — Encounter (HOSPITAL_COMMUNITY): Payer: Self-pay | Admitting: Surgery

## 2017-06-17 LAB — CBC
HCT: 34.5 % — ABNORMAL LOW (ref 39.0–52.0)
HEMATOCRIT: 34.3 % — AB (ref 39.0–52.0)
HEMOGLOBIN: 11.7 g/dL — AB (ref 13.0–17.0)
Hemoglobin: 11.8 g/dL — ABNORMAL LOW (ref 13.0–17.0)
MCH: 31 pg (ref 26.0–34.0)
MCH: 31.3 pg (ref 26.0–34.0)
MCHC: 33.9 g/dL (ref 30.0–36.0)
MCHC: 34.4 g/dL (ref 30.0–36.0)
MCV: 91.3 fL (ref 78.0–100.0)
MCV: 91 fL (ref 78.0–100.0)
PLATELETS: 365 10*3/uL (ref 150–400)
Platelets: 381 10*3/uL (ref 150–400)
RBC: 3.78 MIL/uL — ABNORMAL LOW (ref 4.22–5.81)
RBC: 3.77 MIL/uL — ABNORMAL LOW (ref 4.22–5.81)
RDW: 13.6 % (ref 11.5–15.5)
RDW: 13.6 % (ref 11.5–15.5)
WBC: 25.4 10*3/uL — ABNORMAL HIGH (ref 4.0–10.5)
WBC: 22.7 10*3/uL — ABNORMAL HIGH (ref 4.0–10.5)

## 2017-06-17 LAB — BASIC METABOLIC PANEL
ANION GAP: 10 (ref 5–15)
BUN: 10 mg/dL (ref 6–20)
CALCIUM: 7.9 mg/dL — AB (ref 8.9–10.3)
CO2: 26 mmol/L (ref 22–32)
Chloride: 99 mmol/L — ABNORMAL LOW (ref 101–111)
Creatinine, Ser: 0.97 mg/dL (ref 0.61–1.24)
GFR calc Af Amer: >60 mL/min (ref >60)
GLUCOSE: 160 mg/dL — AB (ref 65–99)
Potassium: 4.3 mmol/L (ref 3.5–5.1)
Sodium: 135 mmol/L (ref 135–145)

## 2017-06-17 LAB — HIV ANTIBODY (ROUTINE TESTING W REFLEX): HIV SCREEN 4TH GENERATION: NONREACTIVE

## 2017-06-17 MED ORDER — DOCUSATE SODIUM 100 MG PO CAPS: 200.0000 mg | ORAL_CAPSULE | Freq: Every day | ORAL | Status: DC | PRN

## 2017-06-17 MED ORDER — DOCUSATE SODIUM 100 MG PO CAPS
200.0000 mg | ORAL_CAPSULE | Freq: Every day | ORAL | Status: DC
  Administered 2017-06-17 – 2017-06-19 (×3): 200 mg via ORAL
  Filled 2017-06-17 (×3): qty 2

## 2017-06-17 NOTE — Progress Notes (Signed)
[facility] Progress Note  1 Day Post-Op  Subjective: CC: pain in perirectal area Pain mostly with movement. No BM yet. Discussed sitz baths. Packing removed.   Objective: Vital signs in last 24 hours: Temp:  [98.5 F (36.9 C)-99.5 F (37.5 C)] 98.7 F (37.1 C) (05/29 0553) Pulse Rate:  [88-102] 88 (05/29 0553) Resp:  [11-23] 16 (05/29 0553) BP: (95-131)/(55-88) 101/70 (05/29 0553) SpO2:  [90 %-100 %] 100 % (05/29 0553)    Intake/Output from previous day: 05/28 0701 - 05/29 0700 In: 5759.6 [P.O.:920; I.V.:1989.6; IV Piggyback:2850] Out: 670 [Urine:650; Blood:20] Intake/Output this shift: No intake/output data recorded.  PE: Gen:  Alert, NAD, pleasant Card:  Regular rate and rhythm Pulm:  Normal effort, clear to auscultation bilaterally Abd: Soft, non-tender, non-distended GU: packing removed from incisions, no surrounding erythema, minimal induration, copious drainage (bloody-purulent), penrose drains present Skin: warm and dry, no rashes  Psych: A&Ox3   Lab Results:  Recent Labs    06/16/17 1255 06/17/17 0537  WBC 21.0* 25.4*  HGB 13.2 11.7*  HCT 38.2* 34.5*  PLT 360 365   BMET Recent Labs    06/16/17 1255 06/17/17 0537  NA 132* 135  K 4.2 4.3  CL 96* 99*  CO2 26 26  GLUCOSE 102* 160*  BUN 10 10  CREATININE 0.97 0.97  CALCIUM 8.7* 7.9*   PT/INR No results for input(s): LABPROT, INR in the last 72 hours. CMP     Component Value Date/Time   NA 135 06/17/2017 0537   K 4.3 06/17/2017 0537   CL 99 (L) 06/17/2017 0537   CO2 26 06/17/2017 0537   GLUCOSE 160 (H) 06/17/2017 0537   BUN 10 06/17/2017 0537   CREATININE 0.97 06/17/2017 0537   CALCIUM 7.9 (L) 06/17/2017 0537   PROT 7.2 06/16/2017 1255   ALBUMIN 2.6 (L) 06/16/2017 1255   AST 23 06/16/2017 1255   ALT 14 (L) 06/16/2017 1255   ALKPHOS 78 06/16/2017 1255   BILITOT 0.7 06/16/2017 1255   GFRNONAA >60 06/17/2017 0537   GFRAA >60 06/17/2017 0537   Lipase  No results found for:  LIPASE     Studies/Results: Ct Abdomen Pelvis W Contrast  Result Date: 06/16/2017 CLINICAL DATA:  Rectal pain for several days. Negative trauma history, evaluate for abscess. EXAM: CT ABDOMEN AND PELVIS WITH CONTRAST TECHNIQUE: Multidetector CT imaging of the abdomen and pelvis was performed using the standard protocol following bolus administration of intravenous contrast. CONTRAST:  141mL OMNIPAQUE IOHEXOL 300 MG/ML  SOLN COMPARISON:  None. FINDINGS: Lower chest:  No contributory findings. Hepatobiliary: 13 mm indistinct low-density in the segment 7. No definite nodular enhancement or regional altered perfusion. This area is not covered on the delayed phase. Tiny low-density in segment 2, too small for densitometry.No evidence of biliary obstruction or stone. Pancreas: Unremarkable. Spleen: Unremarkable. Adrenals/Urinary Tract: Negative adrenals. No hydronephrosis or stone. Bladder is distended without superimposed acute inflammation. Stomach/Bowel: Perirectal fluid collection with rim enhancement and scattered gas bubbles that wraps around the anus. Gas bubbles appear to be within the collection rather than from the largest component, as seen on sagittal images measures 7 x 8 cm. No collection or inflammation seen above the levator. There is finger-like extension anteriorly towards the right base of penis without penile involvement. The anus is distorted, with underlying mass not excluded. Prominent gastric antral wall thickness, but presumably accentuated by collapse. No surrounding inflammatory changes. Vascular/Lymphatic: No acute vascular abnormality. Mild atherosclerotic calcification. Bilateral inguinal lymphadenopathy which may be reactive.  No retroperitoneal adenopathy. Reproductive:Dystrophic prostate calcifications. No acute inflammation noted. Other: Generalized intra-abdominal fat haziness without discrete edema. Mild fatty enlargement of the inguinal canals, greater on the left.  Musculoskeletal: No acute abnormalities. Prominent bilateral hip osteoarthritis. Spondylosis. IMPRESSION: 1. Large circumferential perianal abscess with gas extending superiorly on the right more than left to the level of the pelvic floor. The abscess measures up to 8 cm. The underlying anus is distorted with mass not excluded. 2. 13 mm nonspecific lesion in the right liver. Abscess is a consideration given #1. If a mass is discovered at exam metastasis would be the primary concern. After convalescence, liver MRI is recommended for definitive characterization. Distended urinary bladder, question retention in this setting. Electronically Signed   By: Monte Fantasia M.D.   On: 06/16/2017 15:36    Anti-infectives: Anti-infectives (From admission, onward)   Start     Dose/Rate Route Frequency Ordered Stop   06/17/17 0300  vancomycin (VANCOCIN) IVPB 1000 mg/200 mL premix     1,000 mg 200 mL/hr over 60 Minutes Intravenous Every 12 hours 06/16/17 1504     06/16/17 2000  piperacillin-tazobactam (ZOSYN) IVPB 3.375 g     3.375 g 12.5 mL/hr over 240 Minutes Intravenous Every 8 hours 06/16/17 1504     06/16/17 1530  vancomycin (VANCOCIN) 1,500 mg in sodium chloride 0.9 % 500 mL IVPB     1,500 mg 250 mL/hr over 120 Minutes Intravenous  Once 06/16/17 1300 06/16/17 1631   06/16/17 1300  piperacillin-tazobactam (ZOSYN) IVPB 3.375 g     3.375 g 100 mL/hr over 30 Minutes Intravenous  Once 06/16/17 1255 06/16/17 1545   06/16/17 1300  vancomycin (VANCOCIN) IVPB 1000 mg/200 mL premix  Status:  Discontinued     1,000 mg 200 mL/hr over 60 Minutes Intravenous  Once 06/16/17 1255 06/16/17 1300       Assessment/Plan 13 mm nonspecific lesion R liver Tobacco/marijuana use  Perirectal abscess - s/p I&D 06/16/17 Dr. Ninfa Linden - POD#1 - sitz baths 4x daily or more prn - WBC 25 today, afebrile - repeat cbc this afternoon - continue IV abx - culture pending  - could potentially discharge home this afternoon, will  recheck  FEN: regular diet VTE: SCDs ID: [email or web address] 5/28>>  LOS: 0 days    Brigid Re , [facility] 06/17/2017, 7:57 AM Pager: [phone] Consults: [phone] Mon-Fri 7:00 am-4:30 pm Sat-Sun 7:00 am-11:30 am

## 2017-06-17 NOTE — Anesthesia Postprocedure Evaluation (Signed)
Anesthesia Post Note  Patient: Tanner Gallagher  Procedure(s) Performed: IRRIGATION AND DEBRIDEMENT PERIRECTAL ABSCESS (N/A ) RIGID PROCTOSCOPY     Patient location during evaluation: PACU Anesthesia Type: General Level of consciousness: awake and alert Pain management: pain level controlled Vital Signs Assessment: post-procedure vital signs reviewed and stable Respiratory status: spontaneous breathing, nonlabored ventilation and respiratory function stable Cardiovascular status: blood pressure returned to baseline and stable Postop Assessment: no apparent nausea or vomiting Anesthetic complications: no    Last Vitals:  Vitals:   06/17/17 0237 06/17/17 0553  BP: 102/64 101/70  Pulse: 89 88  Resp: 18 16  Temp: 37.5 C 37.1 C  SpO2: 100% 100%    Last Pain:  Vitals:   06/17/17 0553  TempSrc: Oral  PainSc:                  Wilfred Siverson,W. EDMOND

## 2017-06-18 LAB — CBC
HEMATOCRIT: 35 % — AB (ref 39.0–52.0)
HEMOGLOBIN: 11.7 g/dL — AB (ref 13.0–17.0)
MCH: 31.1 pg (ref 26.0–34.0)
MCHC: 33.4 g/dL (ref 30.0–36.0)
MCV: 93.1 fL (ref 78.0–100.0)
Platelets: 397 10*3/uL (ref 150–400)
RBC: 3.76 MIL/uL — AB (ref 4.22–5.81)
RDW: 14.1 % (ref 11.5–15.5)
WBC: 12.2 10*3/uL — ABNORMAL HIGH (ref 4.0–10.5)

## 2017-06-18 MED ORDER — ACETAMINOPHEN 500 MG PO TABS
1000.0000 mg | ORAL_TABLET | Freq: Three times a day (TID) | ORAL | Status: DC
  Administered 2017-06-18 – 2017-06-19 (×4): 1000 mg via ORAL
  Filled 2017-06-18 (×4): qty 2

## 2017-06-18 MED ORDER — WHITE PETROLATUM EX OINT
TOPICAL_OINTMENT | CUTANEOUS | Status: AC
  Administered 2017-06-18: 08:00:00
  Filled 2017-06-18: qty 28.35

## 2017-06-18 MED ORDER — POLYETHYLENE GLYCOL 3350 17 G PO PACK: 17.0000 g | PACK | Freq: Every day | ORAL | Status: DC | PRN

## 2017-06-18 MED ORDER — DOXYCYCLINE HYCLATE 100 MG PO TABS
100.0000 mg | ORAL_TABLET | Freq: Two times a day (BID) | ORAL | Status: DC
  Administered 2017-06-18 (×2): 100 mg via ORAL
  Filled 2017-06-18 (×2): qty 1

## 2017-06-18 MED ORDER — POLYETHYLENE GLYCOL 3350 17 G PO PACK: 17.0000 g | PACK | Freq: Every day | ORAL | Status: DC

## 2017-06-18 NOTE — Progress Notes (Signed)
[facility] Progress Note  2 Days Post-Op  Subjective: CC: pain Patient with continued pain, only took one dose IV pain medication overnight but is taking PO pain meds q4h. Tolerating diet, passing flatus but no BM. Has been doing sitz baths q4h but feel like he has some increased pain afterward.   Objective: Vital signs in last 24 hours: Temp:  [98.1 F (36.7 C)-98.6 F (37 C)] 98.3 F (36.8 C) (05/30 0527) Pulse Rate:  [80-94] 83 (05/30 0527) Resp:  [15-18] 18 (05/30 0527) BP: (90-110)/(62-71) 110/71 (05/30 0527) SpO2:  [98 %-100 %] 98 % (05/30 0527) Weight:  [70.3 kg (155 lb)] 70.3 kg (155 lb) (05/29 0845) Last BM Date: 06/14/17  Intake/Output from previous day: 05/29 0701 - 05/30 0700 In: 1752 [P.O.:702; I.V.:750; IV Piggyback:300] Out: 300 [Urine:300] Intake/Output this shift: No intake/output data recorded.  PE: Gen:  Alert, NAD, pleasant Card:  Regular rate and rhythm Pulm:  Normal effort, clear to auscultation bilaterally Abd: Soft, non-tender, non-distended GU:  no surrounding erythema, induration present, moderate purulent drainage , penrose drains present Skin: warm and dry, no rashes  Psych: A&Ox3     Lab Results:  Recent Labs    06/17/17 1338 06/18/17 0437  WBC 22.7* 12.2*  HGB 11.8* 11.7*  HCT 34.3* 35.0*  PLT 381 397   BMET Recent Labs    06/16/17 1255 06/17/17 0537  NA 132* 135  K 4.2 4.3  CL 96* 99*  CO2 26 26  GLUCOSE 102* 160*  BUN 10 10  CREATININE 0.97 0.97  CALCIUM 8.7* 7.9*   PT/INR No results for input(s): LABPROT, INR in the last 72 hours. CMP     Component Value Date/Time   NA 135 06/17/2017 0537   K 4.3 06/17/2017 0537   CL 99 (L) 06/17/2017 0537   CO2 26 06/17/2017 0537   GLUCOSE 160 (H) 06/17/2017 0537   BUN 10 06/17/2017 0537   CREATININE 0.97 06/17/2017 0537   CALCIUM 7.9 (L) 06/17/2017 0537   PROT 7.2 06/16/2017 1255   ALBUMIN 2.6 (L) 06/16/2017 1255   AST 23 06/16/2017 1255   ALT 14 (L)  06/16/2017 1255   ALKPHOS 78 06/16/2017 1255   BILITOT 0.7 06/16/2017 1255   GFRNONAA >60 06/17/2017 0537   GFRAA >60 06/17/2017 0537   Lipase  No results found for: LIPASE     Studies/Results: Ct Abdomen Pelvis W Contrast  Result Date: 06/16/2017 CLINICAL DATA:  Rectal pain for several days. Negative trauma history, evaluate for abscess. EXAM: CT ABDOMEN AND PELVIS WITH CONTRAST TECHNIQUE: Multidetector CT imaging of the abdomen and pelvis was performed using the standard protocol following bolus administration of intravenous contrast. CONTRAST:  172mL OMNIPAQUE IOHEXOL 300 MG/ML  SOLN COMPARISON:  None. FINDINGS: Lower chest:  No contributory findings. Hepatobiliary: 13 mm indistinct low-density in the segment 7. No definite nodular enhancement or regional altered perfusion. This area is not covered on the delayed phase. Tiny low-density in segment 2, too small for densitometry.No evidence of biliary obstruction or stone. Pancreas: Unremarkable. Spleen: Unremarkable. Adrenals/Urinary Tract: Negative adrenals. No hydronephrosis or stone. Bladder is distended without superimposed acute inflammation. Stomach/Bowel: Perirectal fluid collection with rim enhancement and scattered gas bubbles that wraps around the anus. Gas bubbles appear to be within the collection rather than from the largest component, as seen on sagittal images measures 7 x 8 cm. No collection or inflammation seen above the levator. There is finger-like extension anteriorly towards the right base of penis without penile  involvement. The anus is distorted, with underlying mass not excluded. Prominent gastric antral wall thickness, but presumably accentuated by collapse. No surrounding inflammatory changes. Vascular/Lymphatic: No acute vascular abnormality. Mild atherosclerotic calcification. Bilateral inguinal lymphadenopathy which may be reactive. No retroperitoneal adenopathy. Reproductive:Dystrophic prostate calcifications. No acute  inflammation noted. Other: Generalized intra-abdominal fat haziness without discrete edema. Mild fatty enlargement of the inguinal canals, greater on the left. Musculoskeletal: No acute abnormalities. Prominent bilateral hip osteoarthritis. Spondylosis. IMPRESSION: 1. Large circumferential perianal abscess with gas extending superiorly on the right more than left to the level of the pelvic floor. The abscess measures up to 8 cm. The underlying anus is distorted with mass not excluded. 2. 13 mm nonspecific lesion in the right liver. Abscess is a consideration given #1. If a mass is discovered at exam metastasis would be the primary concern. After convalescence, liver MRI is recommended for definitive characterization. Distended urinary bladder, question retention in this setting. Electronically Signed   By: Monte Fantasia M.D.   On: 06/16/2017 15:36    Anti-infectives: Anti-infectives (From admission, onward)   Start     Dose/Rate Route Frequency Ordered Stop   06/17/17 0300  vancomycin (VANCOCIN) IVPB 1000 mg/200 mL premix     1,000 mg 200 mL/hr over 60 Minutes Intravenous Every 12 hours 06/16/17 1504     06/16/17 2000  piperacillin-tazobactam (ZOSYN) IVPB 3.375 g     3.375 g 12.5 mL/hr over 240 Minutes Intravenous Every 8 hours 06/16/17 1504     06/16/17 1530  vancomycin (VANCOCIN) 1,500 mg in sodium chloride 0.9 % 500 mL IVPB     1,500 mg 250 mL/hr over 120 Minutes Intravenous  Once 06/16/17 1300 06/16/17 1631   06/16/17 1300  piperacillin-tazobactam (ZOSYN) IVPB 3.375 g     3.375 g 100 mL/hr over 30 Minutes Intravenous  Once 06/16/17 1255 06/16/17 1545   06/16/17 1300  vancomycin (VANCOCIN) IVPB 1000 mg/200 mL premix  Status:  Discontinued     1,000 mg 200 mL/hr over 60 Minutes Intravenous  Once 06/16/17 1255 06/16/17 1300       Assessment/Plan 13 mm nonspecific lesion R liver Tobacco/marijuana use  Perirectal abscess - s/p I&D 06/16/17 Dr. Ninfa Linden - POD#2 - sitz baths 4x daily or  more prn - WBC 12.2 from 25.4, afebrile - continue IV abx - may be able to transition to PO abx today, will discuss with MD - culture pending - showing mod G+ cocci, few G- rods, few G+ rods - added scheduled tylenol for pain control - likely home tomorrow  FEN: regular diet, saline lock IV VTE: SCDs, lovenox ID: [id number] 5/28>>    LOS: 1 day    Brigid Re , [facility] Surgery 06/18/2017, 7:44 AM Pager: [phone] Consults: [phone] Mon-Fri 7:00 am-4:30 pm Sat-Sun 7:00 am-11:30 am

## 2017-06-19 LAB — CBC
HCT: 35.6 % — ABNORMAL LOW (ref 39.0–52.0)
HEMOGLOBIN: 12 g/dL — AB (ref 13.0–17.0)
MCH: 31 pg (ref 26.0–34.0)
MCHC: 33.7 g/dL (ref 30.0–36.0)
MCV: 92 fL (ref 78.0–100.0)
Platelets: 420 10*3/uL — ABNORMAL HIGH (ref 150–400)
RBC: 3.87 MIL/uL — AB (ref 4.22–5.81)
RDW: 13.9 % (ref 11.5–15.5)
WBC: 8.2 10*3/uL (ref 4.0–10.5)

## 2017-06-19 LAB — URINE CULTURE: Culture: 50000 — AB

## 2017-06-19 MED ORDER — CEFDINIR 300 MG PO CAPS: 300.0000 mg | ORAL_CAPSULE | Freq: Two times a day (BID) | ORAL | 0 refills | Status: AC

## 2017-06-19 MED ORDER — DOCUSATE SODIUM 100 MG PO CAPS: 200.0000 mg | ORAL_CAPSULE | Freq: Every day | ORAL | 0 refills | Status: DC

## 2017-06-19 MED ORDER — OXYCODONE HCL 5 MG PO TABS: 5.0000 mg | ORAL_TABLET | ORAL | 0 refills | Status: DC | PRN

## 2017-06-19 MED ORDER — POLYETHYLENE GLYCOL 3350 17 G PO PACK: 17.0000 g | PACK | Freq: Every day | ORAL | 0 refills | Status: DC | PRN

## 2017-06-19 MED ORDER — POLYETHYLENE GLYCOL 3350 17 G PO PACK: 17.0000 g | PACK | Freq: Every day | ORAL | 0 refills | Status: DC

## 2017-06-19 MED ORDER — CEFDINIR 300 MG PO CAPS
300.0000 mg | ORAL_CAPSULE | Freq: Two times a day (BID) | ORAL | Status: DC
  Administered 2017-06-19: 300 mg via ORAL
  Filled 2017-06-19: qty 1

## 2017-06-19 MED ORDER — POLYETHYLENE GLYCOL 3350 17 G PO PACK
17.0000 g | PACK | Freq: Every day | ORAL | Status: DC
  Administered 2017-06-19: 17 g via ORAL
  Filled 2017-06-19: qty 1

## 2017-06-19 MED FILL — oxyCODONE HCL 5 MG TABS: 5 | 2 days supply | Qty: 20 | Fill #0

## 2017-06-19 MED FILL — CEFDINIR 300 MG CAPS: 300 | 10 days supply | Qty: 20 | Fill #0

## 2017-06-19 NOTE — Progress Notes (Signed)
Pt is discharged to go home. Discharge instructions and prescriptions given.

## 2017-06-19 NOTE — Care Management Note (Signed)
Case Management Note  Patient Details  Name: Tanner Gallagher MRN: [id number] Date of Birth: 1965/04/12  Subjective/Objective:                    Action/Plan:   Expected Discharge Date:  06/19/17               Expected Discharge Plan:  Home/Self Care  In-House Referral:     Discharge planning Services  CM Consult, [facility], [facility], Medication Assistance  Post Acute Care Choice:  NA Choice offered to:  Patient  DME Arranged:  N/A DME Agency:  NA  HH Arranged:  NA HH Agency:  NA  Status of Service:  Completed, signed off  If discussed at [place] of Stay Meetings, dates discussed:    Additional Comments:  Marilu Favre, RN 06/19/2017, 8:52 AM

## 2017-06-19 NOTE — Discharge Summary (Signed)
[facility] Discharge Summary   Patient ID: Tanner Gallagher MRN: [id number] DOB/AGE: [birth year] 52 y.o.  Admit date: 06/16/2017 Discharge date: 06/19/2017  Admitting Diagnosis: Perirectal abscess  Discharge Diagnosis Patient Active Problem List   Diagnosis Date Noted  . Perirectal abscess 06/16/2017    Consultants None  Imaging: No results found.  Procedures Dr. Ninfa Linden (06/16/17) - incision and drainage   Hospital Course:  Patient is a 52 year old male who presented to [facility] with rectal pain and drainage.  Workup showed large perirectal abscess.  Patient was admitted and underwent procedure listed above.  Tolerated procedure well and was transferred to the floor.  Diet was advanced as tolerated.  On POD#3, the patient was tolerating diet, ambulating well, pain well controlled, vital signs stable, drains in place and felt stable for discharge home.  Patient will follow up in our office in 1 week and knows to call with questions or concerns.  He will call to confirm appointment date/time.    Physical Exam: Gen: Alert, NAD, pleasant Card: Regular rate and rhythm Pulm: Normal effort, clear to auscultation bilaterally Abd: Soft, non-tender, non-distended GU:  no surrounding erythema, induration slightly improved, moderate purulent drainage , penrose drains present Skin: warm and dry, no rashes  Psych: A&Ox3     Allergies as of 06/19/2017   No Known Allergies     Medication List    STOP taking these medications   amoxicillin 500 MG capsule Commonly known as:  AMOXIL   clindamycin 300 MG capsule Commonly known as:  CLEOCIN   cyclobenzaprine 10 MG tablet Commonly known as:  FLEXERIL   HYDROcodone-acetaminophen 5-325 MG tablet Commonly known as:  NORCO   ibuprofen 800 MG tablet Commonly known as:  ADVIL,MOTRIN   naproxen 500 MG tablet Commonly known as:  NAPROSYN   opium-belladonna 16.2-60 MG suppository Commonly known as:  B&O SUPPRETTES    penicillin v potassium 500 MG tablet Commonly known as:  VEETID     TAKE these medications   acetaminophen 500 MG tablet Commonly known as:  TYLENOL Take 1,000 mg by mouth every 6 (six) hours as needed.   cefdinir 300 MG capsule Commonly known as:  OMNICEF Take 1 capsule (300 mg total) by mouth every 12 (twelve) hours for 10 days.   docusate sodium 100 MG capsule Commonly known as:  COLACE Take 2 capsules (200 mg total) by mouth daily.   oxyCODONE 5 MG immediate release tablet Commonly known as:  Oxy IR/ROXICODONE Take 1-2 tablets (5-10 mg total) by mouth every 4 (four) hours as needed for moderate pain.   polyethylene glycol packet Commonly known as:  MIRALAX / GLYCOLAX Take 17 g by mouth daily as needed for mild constipation.        Follow-up Information    [facility], [place]. Go on 06/25/2017.   Specialty:  General Surgery Why:  Your appointment is scheduled for 2:30 PM. Please arrive 30 min prior to appointment time to check in. Bring photo ID and any insurance information. Contact information: [place] [address] [phone]        [facility]. Schedule an appointment as soon as possible for a visit.   Contact information: [place] [address] [phone]          Signed: Brigid Re, [facility] Surgery 06/19/2017, 7:38 AM Pager: [phone] Consults: [phone] Mon-Fri 7:00 am-4:30 pm Sat-Sun 7:00 am-11:30 am\

## 2017-06-19 NOTE — Discharge Instructions (Signed)
[facility] _______[facility]  RECTAL SURGERY POST OP INSTRUCTIONS: POST OP INSTRUCTIONS  Always review your discharge instruction sheet given to you by the facility where your surgery was performed. IF YOU HAVE DISABILITY OR FAMILY LEAVE FORMS, YOU MUST BRING THEM TO THE OFFICE FOR PROCESSING.   DO NOT GIVE THEM TO YOUR DOCTOR.  1. A  prescription for pain medication may be given to you upon discharge.  Take your pain medication as prescribed, if needed.  If narcotic pain medicine is not needed, then you may take acetaminophen (Tylenol) or ibuprofen (Advil) as needed. 2. Take your usually prescribed medications unless otherwise directed. 3. If you need a refill on your pain medication, please contact your pharmacy.  They will contact our office to request authorization. Prescriptions will not be filled after 5 pm or on week-ends. 4. You should follow a light diet the first 48 hours after arrival home, such as soup and crackers, etc.  Be sure to include lots of fluids daily.  Resume your normal diet 2-3 days after surgery.. 5. Most patients will experience some swelling and discomfort in the rectal area. Ice packs, reclining and warm tub soaks will help.  Swelling and discomfort can take several days to resolve.  6. It is common to experience some constipation if taking pain medication after surgery.  Increasing fluid intake and taking a stool softener (such as Colace) will usually help or prevent this problem from occurring.  A mild laxative (Milk of Magnesia or Miralax) should be taken according to package directions if there are no bowel movements after 48 hours. 7. Unless discharge instructions indicate otherwise, leave your bandage dry and in place for 24 hours, or remove the bandage if you have a bowel movement. You may notice a small amount of bleeding with bowel movements for the first few days. You may have some packing in the rectum which will come out over the first day or two. You  will need to wear an absorbent pad or soft cotton gauze in your underwear until the drainage stops.it. 8. ACTIVITIES:  You may resume regular (light) daily activities beginning the next day--such as daily self-care, walking, climbing stairs--gradually increasing activities as tolerated.  You may have sexual intercourse when it is comfortable.  Refrain from any heavy lifting or straining until approved by your doctor. a. You may drive when you are no longer taking prescription pain medication, you can comfortably wear a seatbelt, and you can safely maneuver your car and apply brakes. b. RETURN TO WORK: : ____________________ 9. You should see your doctor in the office for a follow-up appointment approximately 2-3 weeks after your surgery.  Make sure that you call for this appointment within a day or two after you arrive home to insure a convenient appointment time.  WHEN TO CALL YOUR DOCTOR: 1. Fever over 101.0 2. Inability to urinate 3. Nausea and/or vomiting 4. Extreme swelling or bruising 5. Continued bleeding from rectum. 6. Increased pain, redness, or drainage from the incision 7. Constipation  The clinic staff is available to answer your questions during regular business hours.  Please dont hesitate to call and ask to speak to one of the nurses for clinical concerns.  If you have a medical emergency, go to the nearest emergency room or call 911.  A surgeon from [facility] is always on call at the hospital   [address], [place], [place], [place]  [address] ?  [address] [address], [place], [place]  [address] [phone] ? [phone] ? FAX [phone] Web site: [email or web address]   How to Take a Sitz Bath A sitz bath is a warm water bath that is taken while you are sitting down. The water should only come up to your hips and should cover your buttocks. Your health care provider may recommend a sitz bath to help you:  Clean the lower part of your body,  including your genital area.  With itching.  With pain.  With sore muscles or muscles that tighten or spasm.  How to take a sitz bath Take 3-4 sitz baths per day or as told by your health care provider. 1. Partially fill a bathtub with warm water. You will only need the water to be deep enough to cover your hips and buttocks when you are sitting in it. 2. If your health care provider told you to put medicine in the water, follow the directions exactly. 3. Sit in the water and open the tub drain a little. 4. Turn on the warm water again to keep the tub at the correct level. Keep the water running constantly. 5. Soak in the water for 15-20 minutes or as told by your health care provider. 6. After the sitz bath, pat the affected area dry first. Do not rub it. 7. Be careful when you stand up after the sitz bath because you may feel dizzy.  Contact a health care provider if:  Your symptoms get worse. Do not continue with sitz baths if your symptoms get worse.  You have new symptoms. Do not continue with sitz baths until you talk with your health care provider. This information is not intended to replace advice given to you by your health care provider. Make sure you discuss any questions you have with your health care provider. Document Released: 09/29/2003 Document Revised: 06/06/2015 Document Reviewed: 01/04/2014 Elsevier Interactive Patient Education  [employer].

## 2017-06-21 LAB — CULTURE, BLOOD (ROUTINE X 2)
CULTURE: NO GROWTH
Culture: NO GROWTH

## 2017-06-21 LAB — AEROBIC/ANAEROBIC CULTURE W GRAM STAIN (SURGICAL/DEEP WOUND)

## 2017-06-21 LAB — AEROBIC/ANAEROBIC CULTURE (SURGICAL/DEEP WOUND)

## 2018-01-30 ENCOUNTER — Other Ambulatory Visit: Payer: Self-pay

## 2018-01-30 ENCOUNTER — Encounter (HOSPITAL_BASED_OUTPATIENT_CLINIC_OR_DEPARTMENT_OTHER): Payer: Self-pay | Admitting: Emergency Medicine

## 2018-01-30 ENCOUNTER — Emergency Department (HOSPITAL_BASED_OUTPATIENT_CLINIC_OR_DEPARTMENT_OTHER): Payer: Self-pay

## 2018-01-30 ENCOUNTER — Emergency Department (HOSPITAL_BASED_OUTPATIENT_CLINIC_OR_DEPARTMENT_OTHER)
Admission: EM | Admit: 2018-01-30 | Discharge: 2018-01-30 | Disposition: A | Discharge status: Home / Self Care | Payer: Self-pay | Attending: Emergency Medicine | Admitting: Emergency Medicine

## 2018-01-30 DIAGNOSIS — L0211 Cutaneous abscess of neck: Secondary | ICD-10-CM | POA: Insufficient documentation

## 2018-01-30 DIAGNOSIS — Z79899 Other long term (current) drug therapy: Secondary | ICD-10-CM | POA: Insufficient documentation

## 2018-01-30 DIAGNOSIS — Z87891 Personal history of nicotine dependence: Secondary | ICD-10-CM | POA: Insufficient documentation

## 2018-01-30 LAB — CBC WITH DIFFERENTIAL/PLATELET
ABS IMMATURE GRANULOCYTES: 0.01 10*3/uL (ref 0.00–0.07)
BASOS PCT: 1 %
Basophils Absolute: 0 10*3/uL (ref 0.0–0.1)
Eosinophils Absolute: 0.4 10*3/uL (ref 0.0–0.5)
Eosinophils Relative: 6 %
HCT: 45.8 % (ref 39.0–52.0)
Hemoglobin: 14.9 g/dL (ref 13.0–17.0)
Immature Granulocytes: 0 %
Lymphocytes Relative: 43 %
Lymphs Abs: 2.8 10*3/uL (ref 0.7–4.0)
MCH: 31.8 pg (ref 26.0–34.0)
MCHC: 32.5 g/dL (ref 30.0–36.0)
MCV: 97.7 fL (ref 80.0–100.0)
MONOS PCT: 11 %
Monocytes Absolute: 0.7 10*3/uL (ref 0.1–1.0)
NEUTROS ABS: 2.6 10*3/uL (ref 1.7–7.7)
NEUTROS PCT: 39 %
PLATELETS: 215 10*3/uL (ref 150–400)
RBC: 4.69 MIL/uL (ref 4.22–5.81)
RDW: 14.4 % (ref 11.5–15.5)
WBC: 6.5 10*3/uL (ref 4.0–10.5)
nRBC: 0 % (ref 0.0–0.2)

## 2018-01-30 LAB — BASIC METABOLIC PANEL
ANION GAP: 7 (ref 5–15)
BUN: 17 mg/dL (ref 6–20)
CHLORIDE: 103 mmol/L (ref 98–111)
CO2: 27 mmol/L (ref 22–32)
Calcium: 9 mg/dL (ref 8.9–10.3)
Creatinine, Ser: 0.89 mg/dL (ref 0.61–1.24)
GFR calc Af Amer: >60 mL/min (ref >60)
GLUCOSE: 136 mg/dL — AB (ref 70–99)
Potassium: 3.9 mmol/L (ref 3.5–5.1)
Sodium: 137 mmol/L (ref 135–145)

## 2018-01-30 MED ORDER — LIDOCAINE-EPINEPHRINE (PF) 2 %-1:200000 IJ SOLN
10.0000 mL | Freq: Once | INTRAMUSCULAR | Status: AC
  Administered 2018-01-30: 10 mL via INTRADERMAL
  Filled 2018-01-30 (×2): qty 10

## 2018-01-30 MED ORDER — CEPHALEXIN 500 MG PO CAPS: 500.0000 mg | ORAL_CAPSULE | Freq: Four times a day (QID) | ORAL | 0 refills | Status: AC

## 2018-01-30 MED ORDER — IOPAMIDOL (ISOVUE-300) INJECTION 61%
100.0000 mL | Freq: Once | INTRAVENOUS | Status: AC | PRN
  Administered 2018-01-30: 75 mL via INTRAVENOUS

## 2018-01-30 MED ORDER — SULFAMETHOXAZOLE-TRIMETHOPRIM 800-160 MG PO TABS: 1.0000 | ORAL_TABLET | Freq: Two times a day (BID) | ORAL | 0 refills | Status: AC

## 2018-01-30 NOTE — ED Provider Notes (Signed)
[facility] EMERGENCY DEPARTMENT Provider Note   CSN: [id number] Arrival date & time: 01/30/18  1231     History   Chief Complaint Chief Complaint  Patient presents with  . Abscess    HPI Tanner Gallagher is a 53 y.o. male.  HPI 53 year old male presents with concern for a neck abscess.  He states that for the last year or more he is been having on and off swelling to his right submandibular neck.  He states that typically it is relatively small but over the last 2 weeks it has been larger.  At one point it drained some white/pink fluid.  It is not really tender or painful.  He is not had any fever, sore throat, trouble breathing or swallowing, or dental pain/oral swelling.  Past Medical History:  Diagnosis Date  . Medical history non-contributory     Patient Active Problem List   Diagnosis Date Noted  . Perirectal abscess 06/16/2017    Past Surgical History:  Procedure Laterality Date  . INCISION AND DRAINAGE PERIRECTAL ABSCESS  06/16/2017  . INCISION AND DRAINAGE PERIRECTAL ABSCESS N/A 06/16/2017   Procedure: IRRIGATION AND DEBRIDEMENT PERIRECTAL ABSCESS;  Surgeon: Coralie Keens, MD;  Location: [facility];  Service: General;  Laterality: N/A;  . INGUINAL HERNIA REPAIR Right 2000s  . PROCTOSCOPY  06/16/2017   Procedure: RIGID PROCTOSCOPY;  Surgeon: Coralie Keens, MD;  Location: [facility];  Service: General;;        Home Medications    Prior to Admission medications   Medication Sig Start Date End Date Taking? Authorizing Provider  acetaminophen (TYLENOL) 500 MG tablet Take 1,000 mg by mouth every 6 (six) hours as needed.    [provider]  cephALEXin (KEFLEX) 500 MG capsule Take 1 capsule (500 mg total) by mouth 4 (four) times daily for 5 days. 01/30/18 02/04/18  Sherwood Gambler, MD  docusate sodium (COLACE) 100 MG capsule Take 2 capsules (200 mg total) by mouth daily. 06/19/17   Rayburn, Floyce Stakes, PA-C  oxyCODONE (OXY IR/ROXICODONE) 5 MG immediate release  tablet Take 1-2 tablets (5-10 mg total) by mouth every 4 (four) hours as needed for moderate pain. 06/19/17   Rayburn, Claiborne Billings A, PA-C  polyethylene glycol (MIRALAX / GLYCOLAX) packet Take 17 g by mouth daily. 06/19/17   Rayburn, Floyce Stakes, PA-C  sulfamethoxazole-trimethoprim (BACTRIM DS,SEPTRA DS) 800-160 MG tablet Take 1 tablet by mouth 2 (two) times daily for 5 days. 01/30/18 02/04/18  Sherwood Gambler, MD    Family History History reviewed. No pertinent family history.  Social History Social History   Tobacco Use  . Smoking status: Former Smoker    Packs/day: 0.12    Years: 37.00    Pack years: 4.44    Types: Cigarettes    Last attempt to quit: 05/20/2017    Years since quitting: 0.6  . Smokeless tobacco: Never Used  Substance Use Topics  . Alcohol use: Yes    Comment: 06/16/2017 "nothing in the last couple months"  . Drug use: Yes    Types: Marijuana    Comment: 06/16/2017 "weekly"     Allergies   Patient has no known allergies.   Review of Systems Review of Systems  Constitutional: Negative for fever.  HENT: Negative for sore throat and trouble swallowing.   Respiratory: Negative for shortness of breath.   Gastrointestinal: Negative for vomiting.  Musculoskeletal: Positive for neck pain.  All other systems reviewed and are negative.    Physical Exam Updated Vital Signs BP 111/70 (  BP Location: Right Arm)   Pulse 63   Temp 98 F (36.7 C) (Oral)   Resp 16   Ht 5\' 9"  (1.753 m)   Wt 68 kg   SpO2 100%   BMI 22.15 kg/m   Physical Exam Vitals signs and nursing note reviewed.  Constitutional:      General: He is not in acute distress.    Appearance: He is well-developed. He is not ill-appearing or diaphoretic.  HENT:     Head: Normocephalic and atraumatic.     Jaw: No trismus.     Right Ear: External ear normal.     Left Ear: External ear normal.     Nose: Nose normal.     Mouth/Throat:     Dentition: No dental abscesses.     Pharynx: No pharyngeal swelling,  oropharyngeal exudate, posterior oropharyngeal erythema or uvula swelling.     Tonsils: No tonsillar exudate.  Eyes:     General:        Right eye: No discharge.        Left eye: No discharge.  Neck:     Musculoskeletal: Normal range of motion and neck supple. No erythema, neck rigidity or pain with movement.   Cardiovascular:     Rate and Rhythm: Normal rate and regular rhythm.     Heart sounds: Normal heart sounds.  Pulmonary:     Effort: Pulmonary effort is normal.     Breath sounds: Normal breath sounds.  Abdominal:     Palpations: Abdomen is soft.     Tenderness: There is no abdominal tenderness.  Skin:    General: Skin is warm and dry.  Neurological:     Mental Status: He is alert.  Psychiatric:        Mood and Affect: Mood is not anxious.      ED Treatments / Results  Labs (all labs ordered are listed, but only abnormal results are displayed) Labs Reviewed  BASIC METABOLIC PANEL - Abnormal; Notable for the following components:      Result Value   Glucose, Bld 136 (*)    All other components within normal limits  CBC WITH DIFFERENTIAL/PLATELET    EKG None  Radiology Ct Soft Tissue Neck W Contrast  Result Date: 01/30/2018 CLINICAL DATA:  Right submandibular neck mass. Mass increasing in size. Patient states the lesion is otherwise asymptomatic. EXAM: CT NECK WITH CONTRAST TECHNIQUE: Multidetector CT imaging of the neck was performed using the standard protocol following the bolus administration of intravenous contrast. CONTRAST:  18mL ISOVUE-300 IOPAMIDOL (ISOVUE-300) INJECTION 61% COMPARISON:  None. FINDINGS: Pharynx and larynx: There is mild prominence of the adenoid tissue. Palatine tonsils are enlarged bilaterally. Lingual tonsils are mildly prominent. No discrete mass is present. The parapharyngeal fat is clear bilaterally. The oropharynx is otherwise unremarkable. Epiglottis and hypopharynx are normal. Vocal cords are midline and symmetric. Trachea is normal.  Salivary glands: The parotid glands are within normal limits bilaterally. The submandibular glands are unremarkable. There is a subcutaneous collection or focal lesion just lateral and inferior to the right submandibular gland which measures 11 x 9 x 17 mm. Diffuse inflammatory changes surround this collection. It is inseparable from the inferior margin of the right submandibular gland. Thyroid: Normal. Lymph nodes: No significant cervical adenopathy is present. Vascular: Unremarkable. Limited intracranial: Within normal limits. Visualized orbits: The globes and orbits are within normal limits. Mastoids and visualized paranasal sinuses: Mild mucosal thickening is present throughout the ethmoid air cells and inferior frontal sinuses  bilaterally. There are no fluid levels. Maxillary and sphenoid sinuses are clear. The mastoid air cells are clear. Skeleton: Vertebral body heights and alignment are normal. There straightening of the normal cervical lordosis. Endplate uncovertebral spurring is worse left than right at C3-4. No focal lytic or blastic lesions are present. Upper chest: The lung apices are clear. IMPRESSION: 1. Focal subcutaneous collection or lesion just lateral and inferior to the right submandibular gland measures 11 x 9 x 17 mm. The differential diagnosis includes a necrotic lymph node, a branchial cleft cyst secondary infection. Chronic subcutaneous abscess, or focal primary neoplasm. The area should be amenable to direct sampling. 2. No significant adenopathy or evidence for distal disease. 3. Mild degenerative changes of the cervical spine at C3-4. Electronically Signed   By: San Morelle M.D.   On: 01/30/2018 14:08    Procedures .Marland KitchenIncision and Drainage Date/Time: 01/30/2018 3:21 PM Performed by: Sherwood Gambler, MD Authorized by: Sherwood Gambler, MD   Consent:    Consent obtained:  Verbal   Consent given by:  Patient   Risks discussed:  Bleeding, incomplete drainage, infection,  damage to other organs and pain Location:    Type:  Abscess   Size:  2 cm   Location:  Neck   Neck location:  R anterior Pre-procedure details:    Skin preparation:  Betadine Anesthesia (see MAR for exact dosages):    Anesthesia method:  Local infiltration   Local anesthetic:  Lidocaine 2% WITH epi Procedure type:    Complexity:  Simple Procedure details:    Needle aspiration: yes     Needle size:  18 G   Incision types:  Single straight   Incision depth:  Dermal   Scalpel blade:  11   Wound management:  Probed and deloculated   Drainage:  Purulent   Drainage amount:  Scant   Wound treatment:  Wound left open   Packing materials:  None Post-procedure details:    Patient tolerance of procedure:  Tolerated well, no immediate complications   (including critical care time)  Medications Ordered in ED Medications  iopamidol (ISOVUE-300) 61 % injection 100 mL (75 mLs Intravenous Contrast Given 01/30/18 1346)  lidocaine-EPINEPHrine (XYLOCAINE W/EPI) 2 %-1:200000 (PF) injection 10 mL (10 mLs Intradermal Given by Other 01/30/18 1425)     Initial Impression / Assessment and Plan / ED Course  I have reviewed the triage vital signs and the nursing notes.  Pertinent labs & imaging results that were available during my care of the patient were reviewed by me and considered in my medical decision making (see chart for details).     Patient is overall well-appearing and this is likely a subacute/chronic process.  I discussed CT scan results with ENT on-call, Dr. Blenda Nicely.  She recommends needle aspiration and if pus comes out then incising and draining, otherwise placing on Keflex and following up with ENT.  A very small amount of pus did seem to come out but then when this was opened up with a small incision, only scant pus was removed and it seemed to be more semisolid.  I will prescribe him antibiotics but I think he will need further ENT follow-up.  Does not appear to have systemic  infection.  Discharged home with symptomatic care, antibiotics, and follow-up with ENT.  Final Clinical Impressions(s) / ED Diagnoses   Final diagnoses:  Neck abscess    ED Discharge Orders         Ordered    cephALEXin (KEFLEX) 500  MG capsule  4 times daily     01/30/18 1505    sulfamethoxazole-trimethoprim (BACTRIM DS,SEPTRA DS) 800-160 MG tablet  2 times daily     01/30/18 1505           Sherwood Gambler, MD 01/30/18 [id number]

## 2018-01-30 NOTE — Discharge Instructions (Signed)
If you develop fever, vomiting, trouble breathing or swallowing, or any other new/concerning symptoms then return to the ER for evaluation.  Otherwise follow-up with the ear nose and throat specialist and take the antibiotics until completed.

## 2018-01-30 NOTE — ED Triage Notes (Signed)
Reports right sided neck abscess x months.  Approximately the size of a golf ball.  Denies shortness of breath, difficulty maintaining secretions. Ambulatory to triage in NAD.

## 2018-01-30 NOTE — ED Notes (Signed)
ED Provider at bedside. 

## 2019-06-13 ENCOUNTER — Other Ambulatory Visit: Payer: Self-pay

## 2019-06-13 ENCOUNTER — Emergency Department (HOSPITAL_COMMUNITY): Payer: Self-pay

## 2019-06-13 ENCOUNTER — Emergency Department (HOSPITAL_COMMUNITY)
Admission: EM | Admit: 2019-06-13 | Discharge: 2019-06-13 | Disposition: A | Discharge status: Home / Self Care | Payer: Self-pay | Attending: Emergency Medicine | Admitting: Emergency Medicine

## 2019-06-13 ENCOUNTER — Encounter (HOSPITAL_COMMUNITY): Payer: Self-pay

## 2019-06-13 DIAGNOSIS — Z87891 Personal history of nicotine dependence: Secondary | ICD-10-CM | POA: Insufficient documentation

## 2019-06-13 DIAGNOSIS — R519 Headache, unspecified: Secondary | ICD-10-CM | POA: Insufficient documentation

## 2019-06-13 MED ORDER — PROCHLORPERAZINE EDISYLATE 10 MG/2ML IJ SOLN
10.0000 mg | Freq: Once | INTRAMUSCULAR | Status: AC
  Administered 2019-06-13: 10 mg via INTRAVENOUS
  Filled 2019-06-13: qty 2

## 2019-06-13 MED ORDER — KETOROLAC TROMETHAMINE 15 MG/ML IJ SOLN
15.0000 mg | Freq: Once | INTRAMUSCULAR | Status: AC
  Administered 2019-06-13: 15 mg via INTRAVENOUS
  Filled 2019-06-13: qty 1

## 2019-06-13 MED ORDER — SODIUM CHLORIDE 0.9 % IV BOLUS
1000.0000 mL | Freq: Once | INTRAVENOUS | Status: AC
  Administered 2019-06-13: 1000 mL via INTRAVENOUS

## 2019-06-13 MED ORDER — DIPHENHYDRAMINE HCL 50 MG/ML IJ SOLN
25.0000 mg | Freq: Once | INTRAMUSCULAR | Status: AC
  Administered 2019-06-13: 25 mg via INTRAVENOUS
  Filled 2019-06-13: qty 1

## 2019-06-13 NOTE — ED Triage Notes (Signed)
patient c/o headache x 3 days. Patient denies any blurred vision, N/V. Patient states he has been taking Tylenol with no relief.

## 2019-06-13 NOTE — Discharge Instructions (Signed)
If you need to take continued medication for headaches I would recommend that you combine Tylenol and ibuprofen.  You can take 325 mg of Tylenol with 400 mg of ibuprofen.  This equates to 1 Tylenol and 2 regular ibuprofen.  If your symptoms worsen I need you to return to the emergency department for further evaluation.  I would additionally recommend that you taper your alcohol use versus immediately stopping.  This can help minimize the symptoms of withdrawal as well as the possible dangerous side effects of alcohol withdrawal.  It was a pleasure to meet you.

## 2019-06-13 NOTE — ED Provider Notes (Signed)
[facility] DEPT Provider Note   CSN: [id number] Arrival date & time: 06/13/19  1527     History Chief Complaint  Patient presents with  . Headache    Tanner Gallagher is a 54 y.o. male.  HPI HPI Comments: Tanner Gallagher is a 54 y.o. male who presents to the Emergency Department complaining of HA.  Patient states he began experiencing a diffuse frontal and bitemporal headache about 3 days ago.  He has been taking ibuprofen and Tylenol.  His symptoms are waxing and waning.  His current pain is about 5/10.  He reports associated decreased appetite as well as chills 2 days ago.  He denies nausea or vomiting.  He denies a history of seasonal allergies.  He reports a significant history of alcohol abuse.  He typically drinks 6-10 beers per day.  He quit drinking 3 days ago when he began experiencing his headache.  He reports that he has quit drinking multiple times in the past without adverse effects.  No history of DTs.  No congestion, sore throat, cough, CP, SOB, abd pain, n/v/d/c, blurry vision, photophobia, numbness, tingling, dizziness, LOC.      Past Medical History:  Diagnosis Date  . Medical history non-contributory     Patient Active Problem List   Diagnosis Date Noted  . Perirectal abscess 06/16/2017    Past Surgical History:  Procedure Laterality Date  . INCISION AND DRAINAGE PERIRECTAL ABSCESS  06/16/2017  . INCISION AND DRAINAGE PERIRECTAL ABSCESS N/A 06/16/2017   Procedure: IRRIGATION AND DEBRIDEMENT PERIRECTAL ABSCESS;  Surgeon: Coralie Keens, MD;  Location: [facility];  Service: General;  Laterality: N/A;  . INGUINAL HERNIA REPAIR Right 2000s  . PROCTOSCOPY  06/16/2017   Procedure: RIGID PROCTOSCOPY;  Surgeon: Coralie Keens, MD;  Location: [facility];  Service: General;;       History reviewed. No pertinent family history.  Social History   Tobacco Use  . Smoking status: Former Smoker    Packs/day: 0.12    Years: 37.00    Pack years:  4.44    Types: Cigarettes    Quit date: 05/20/2017    Years since quitting: 2.0  . Smokeless tobacco: Never Used  Substance Use Topics  . Alcohol use: Not Currently  . Drug use: Yes    Types: Marijuana    Comment: 06/16/2017 "weekly"    Home Medications Prior to Admission medications   Medication Sig Start Date End Date Taking? Authorizing Provider  acetaminophen (TYLENOL) 500 MG tablet Take 1,000 mg by mouth every 6 (six) hours as needed for mild pain.    Yes [provider]  docusate sodium (COLACE) 100 MG capsule Take 2 capsules (200 mg total) by mouth daily. Patient not taking: Reported on 06/13/2019 06/19/17   Norm Parcel, PA-C  oxyCODONE (OXY IR/ROXICODONE) 5 MG immediate release tablet Take 1-2 tablets (5-10 mg total) by mouth every 4 (four) hours as needed for moderate pain. Patient not taking: Reported on 06/13/2019 06/19/17   Norm Parcel, PA-C  polyethylene glycol [facility] / GLYCOLAX) packet Take 17 g by mouth daily. Patient not taking: Reported on 06/13/2019 06/19/17   Norm Parcel, PA-C    Allergies    Patient has no known allergies.  Review of Systems   Review of Systems  All other systems reviewed and are negative. Ten systems reviewed and are negative for acute change, except as noted in the HPI.   Physical Exam Updated Vital Signs BP 133/82 (BP Location: Right Arm)  Pulse 79   Temp 98.2 F (36.8 C) (Oral)   Resp 16   Ht 5\' 8"  (1.727 m)   Wt 68 kg   SpO2 100%   BMI 22.81 kg/m   Physical Exam Vitals and nursing note reviewed.  Constitutional:      General: He is not in acute distress.    Appearance: Normal appearance. He is not ill-appearing, toxic-appearing or diaphoretic.  HENT:     Head: Normocephalic and atraumatic.     Right Ear: External ear normal.     Left Ear: External ear normal.     Nose: Nose normal.     Mouth/Throat:     Mouth: Mucous membranes are moist.     Pharynx: Oropharynx is clear. No oropharyngeal exudate or  posterior oropharyngeal erythema.  Eyes:     General: No scleral icterus.    Extraocular Movements: Extraocular movements intact.     Right eye: Normal extraocular motion and no nystagmus.     Left eye: Normal extraocular motion and no nystagmus.     Pupils: Pupils are equal, round, and reactive to light. Pupils are equal.     Right eye: Pupil is round and reactive.     Left eye: Pupil is round and reactive.  Cardiovascular:     Rate and Rhythm: Normal rate and regular rhythm.     Pulses: Normal pulses.     Heart sounds: Normal heart sounds. No murmur. No friction rub. No gallop.   Pulmonary:     Effort: Pulmonary effort is normal. No respiratory distress.     Breath sounds: Normal breath sounds. No stridor. No wheezing, rhonchi or rales.  Abdominal:     General: Abdomen is flat.     Tenderness: There is no abdominal tenderness.  Musculoskeletal:        General: Normal range of motion.     Cervical back: Normal range of motion and neck supple. No tenderness.  Skin:    General: Skin is warm and dry.  Neurological:     General: No focal deficit present.     Mental Status: He is alert and oriented to person, place, and time. Mental status is at baseline.     GCS: GCS eye subscore is 4. GCS verbal subscore is 5. GCS motor subscore is 6.     Cranial Nerves: No cranial nerve deficit, dysarthria or facial asymmetry.     Sensory: No sensory deficit.     Motor: No weakness.     Coordination: Romberg sign negative. Coordination normal.     Gait: Gait normal.     Deep Tendon Reflexes: Reflexes normal.     Comments: Distal sensation is intact.  Strength is 5 out of 5 in all 4 extremities.  Patient moves all 4 extremities spontaneously without difficulty.  Finger-to-nose intact bilaterally without any visible signs of ataxia.  Negative pronator drift.  Extraocular movements intact.  Patient is oriented to person, place, time.  Patient speaks clearly and in complete sentences.  Patient is able to  ambulate with a steady gait.  Psychiatric:        Mood and Affect: Mood normal.        Behavior: Behavior normal.    ED Results / Procedures / Treatments   Labs (all labs ordered are listed, but only abnormal results are displayed) Labs Reviewed - No data to display  EKG None  Radiology CT Head Wo Contrast  Result Date: 06/13/2019 CLINICAL DATA:  Headache 3 days EXAM: CT  HEAD WITHOUT CONTRAST TECHNIQUE: Contiguous axial images were obtained from the base of the skull through the vertex without intravenous contrast. COMPARISON:  None. FINDINGS: Brain: No evidence of acute infarction, hemorrhage, hydrocephalus, extra-axial collection or mass lesion/mass effect. Vascular: No hyperdense vessel or unexpected calcification. Skull: Normal. Negative for fracture or focal lesion. Sinuses/Orbits: Mucosal thickening in the ethmoid sinuses Other: None IMPRESSION: 1. No CT evidence for acute intracranial abnormality. 2. Mild sinus mucosal disease. Electronically Signed   By: Donavan Foil M.D.   On: 06/13/2019 18:44   Procedures Procedures (including critical care time)  Medications Ordered in ED Medications  prochlorperazine (COMPAZINE) injection 10 mg (10 mg Intravenous Given 06/13/19 1758)  diphenhydrAMINE (BENADRYL) injection 25 mg (25 mg Intravenous Given 06/13/19 1758)  sodium chloride 0.9 % bolus 1,000 mL (0 mLs Intravenous Stopped 06/13/19 1931)  ketorolac (TORADOL) 15 MG/ML injection 15 mg (15 mg Intravenous Given 06/13/19 1931)   ED Course  I have reviewed the triage vital signs and the nursing notes.  Pertinent labs & imaging results that were available during my care of the patient were reviewed by me and considered in my medical decision making (see chart for details).    MDM Rules/Calculators/A&P                      Patient is a pleasant 54 year old male that presents with 3 days of waxing and waning headache.  His physical exam is reassuring.  His neurological exam is  benign.  Patient denies a history of headaches and also has a significant history of alcohol abuse.  He quit drinking 3 days ago when his headache started.  No concerning symptoms on physical exam for alcohol withdrawal.  No tremors.  No asterixis.  No tachycardia.  Vital signs within normal limits.  Due to never having history of headache I discussed this patient with my attending physician Dr. Madalyn Rob who recommended that we CT the patient's head.  This was negative for acute abnormalities.  I gave the patient Compazine, Benadryl, 1 L bolus of normal saline for his headache.  This provided significant relief.  He was given an additional 15 mg of Toradol.  He denies any headache at this time.  He states he feels much better and is ready to go home.  We discussed strict return precautions.  We discussed the possibility of subarachnoid hemorrhage and not being able to fully rule out the possibility of this without lumbar puncture.  He understands that if his headache returns and his symptoms worsen he needs to return to the emergency department for further evaluation.  I recommended continued use of Tylenol and ibuprofen.  We discussed proper dosing.  We also discussed the symptoms of alcohol withdrawal and to return to the emergency department if these develop.  His questions were answered and he was amicable at the time of discharge.  His vital signs are stable.  Patient discharged to home/self care.  Condition at discharge: Stable  Note: Portions of this report may have been transcribed using voice recognition software. Every effort was made to ensure accuracy; however, inadvertent computerized transcription errors may be present.    Final Clinical Impression(s) / ED Diagnoses Final diagnoses:  Acute nonintractable headache, unspecified headache type    Rx / DC Orders ED Discharge Orders    None       Rayna Sexton, PA-C 06/13/19 1956    Lucrezia Starch, MD 06/15/19  1004

## 2019-07-02 ENCOUNTER — Other Ambulatory Visit: Payer: Self-pay

## 2019-07-02 ENCOUNTER — Emergency Department (HOSPITAL_COMMUNITY)
Admission: EM | Admit: 2019-07-02 | Discharge: 2019-07-02 | Disposition: A | Discharge status: Home / Self Care | Payer: Self-pay | Attending: Emergency Medicine | Admitting: Emergency Medicine

## 2019-07-02 DIAGNOSIS — I889 Nonspecific lymphadenitis, unspecified: Secondary | ICD-10-CM | POA: Insufficient documentation

## 2019-07-02 DIAGNOSIS — Z87891 Personal history of nicotine dependence: Secondary | ICD-10-CM | POA: Insufficient documentation

## 2019-07-02 LAB — URINALYSIS, ROUTINE W REFLEX MICROSCOPIC
Bacteria, UA: NONE SEEN
Bilirubin Urine: NEGATIVE
Glucose, UA: NEGATIVE mg/dL
Hgb urine dipstick: NEGATIVE
Ketones, ur: NEGATIVE mg/dL
Nitrite: NEGATIVE
Protein, ur: NEGATIVE mg/dL
Specific Gravity, Urine: 1.018 (ref 1.005–1.030)
pH: 5 (ref 5.0–8.0)

## 2019-07-02 LAB — CBC WITH DIFFERENTIAL/PLATELET
Abs Immature Granulocytes: 0.01 10*3/uL (ref 0.00–0.07)
Basophils Absolute: 0 10*3/uL (ref 0.0–0.1)
Basophils Relative: 1 %
Eosinophils Absolute: 0.4 10*3/uL (ref 0.0–0.5)
Eosinophils Relative: 8 %
HCT: 40.8 % (ref 39.0–52.0)
Hemoglobin: 13.5 g/dL (ref 13.0–17.0)
Immature Granulocytes: 0 %
Lymphocytes Relative: 35 %
Lymphs Abs: 1.7 10*3/uL (ref 0.7–4.0)
MCH: 32.4 pg (ref 26.0–34.0)
MCHC: 33.1 g/dL (ref 30.0–36.0)
MCV: 97.8 fL (ref 80.0–100.0)
Monocytes Absolute: 0.6 10*3/uL (ref 0.1–1.0)
Monocytes Relative: 12 %
Neutro Abs: 2.2 10*3/uL (ref 1.7–7.7)
Neutrophils Relative %: 44 %
Platelets: 247 10*3/uL (ref 150–400)
RBC: 4.17 MIL/uL — ABNORMAL LOW (ref 4.22–5.81)
RDW: 14.1 % (ref 11.5–15.5)
WBC: 4.9 10*3/uL (ref 4.0–10.5)
nRBC: 0 % (ref 0.0–0.2)

## 2019-07-02 LAB — COMPREHENSIVE METABOLIC PANEL
ALT: 18 U/L (ref 0–44)
AST: 16 U/L (ref 15–41)
Albumin: 3.4 g/dL — ABNORMAL LOW (ref 3.5–5.0)
Alkaline Phosphatase: 48 U/L (ref 38–126)
Anion gap: 6 (ref 5–15)
BUN: 12 mg/dL (ref 6–20)
CO2: 25 mmol/L (ref 22–32)
Calcium: 8.9 mg/dL (ref 8.9–10.3)
Chloride: 106 mmol/L (ref 98–111)
Creatinine, Ser: 0.88 mg/dL (ref 0.61–1.24)
GFR calc Af Amer: >60 mL/min (ref >60)
GFR calc non Af Amer: >60 mL/min (ref >60)
Glucose, Bld: 104 mg/dL — ABNORMAL HIGH (ref 70–99)
Potassium: 4.6 mmol/L (ref 3.5–5.1)
Sodium: 137 mmol/L (ref 135–145)
Total Bilirubin: 0.7 mg/dL (ref 0.3–1.2)
Total Protein: 6.9 g/dL (ref 6.5–8.1)

## 2019-07-02 LAB — LIPASE, BLOOD: Lipase: 36 U/L (ref 11–51)

## 2019-07-02 MED ORDER — DOXYCYCLINE HYCLATE 100 MG PO CAPS: 100.0000 mg | ORAL_CAPSULE | Freq: Two times a day (BID) | ORAL | 0 refills | Status: AC

## 2019-07-02 NOTE — ED Notes (Signed)
Visitor at bedside.

## 2019-07-02 NOTE — ED Notes (Signed)
Pt discharge instructions and prescriptions reviewed with the patient. The patient verbalized understanding of both. Pt discharged. 

## 2019-07-02 NOTE — ED Provider Notes (Signed)
[place] EMERGENCY DEPARTMENT Provider Note   CSN: [id number] Arrival date & time: 07/02/19  0813     History No chief complaint on file.   Tanner Gallagher is a 54 y.o. male who presents to the ED today with complaint of painless knot to the left inguinal area that pt noticed 10 days ago.  Patient reports he has been doing intermittent heavy lifting i.e. lifting furniture recently but has not noticed a specific strain.  He states 1 day he went home after doing some lifting and noticed this not in his groin area.  He states that it has grown slightly larger in size.  He has tried to apply ice to the area as well as heat to see if it would improve the swelling/not however it has not.  He has not taken anything for pain as he is not having pain.  He reports history of inguinal hernia on the right side however states this feels different.  He has tried to manually reduce the knot without success.  Patient denies fevers, chills, nausea, vomiting, diarrhea, urinary symptoms, testicular pain, any other associated symptoms.   The history is provided by the patient and medical records.       Past Medical History:  Diagnosis Date  . Medical history non-contributory     Patient Active Problem List   Diagnosis Date Noted  . Perirectal abscess 06/16/2017    Past Surgical History:  Procedure Laterality Date  . INCISION AND DRAINAGE PERIRECTAL ABSCESS  06/16/2017  . INCISION AND DRAINAGE PERIRECTAL ABSCESS N/A 06/16/2017   Procedure: IRRIGATION AND DEBRIDEMENT PERIRECTAL ABSCESS;  Surgeon: Coralie Keens, MD;  Location: [facility];  Service: General;  Laterality: N/A;  . INGUINAL HERNIA REPAIR Right 2000s  . PROCTOSCOPY  06/16/2017   Procedure: RIGID PROCTOSCOPY;  Surgeon: Coralie Keens, MD;  Location: [facility];  Service: General;;       No family history on file.  Social History   Tobacco Use  . Smoking status: Former Smoker    Packs/day: 0.12    Years: 37.00    Pack  years: 4.44    Types: Cigarettes    Quit date: 05/20/2017    Years since quitting: 2.1  . Smokeless tobacco: Never Used  Vaping Use  . Vaping Use: Never used  Substance Use Topics  . Alcohol use: Not Currently  . Drug use: Yes    Types: Marijuana    Comment: 06/16/2017 "weekly"    Home Medications Prior to Admission medications   Medication Sig Start Date End Date Taking? Authorizing Provider  acetaminophen (TYLENOL) 500 MG tablet Take 1,000 mg by mouth every 6 (six) hours as needed for mild pain.     [provider]  docusate sodium (COLACE) 100 MG capsule Take 2 capsules (200 mg total) by mouth daily. Patient not taking: Reported on 06/13/2019 06/19/17   Norm Parcel, PA-C  doxycycline (VIBRAMYCIN) 100 MG capsule Take 1 capsule (100 mg total) by mouth 2 (two) times daily for 7 days. 07/02/19 07/09/19  Eustaquio Maize, PA-C  oxyCODONE (OXY IR/ROXICODONE) 5 MG immediate release tablet Take 1-2 tablets (5-10 mg total) by mouth every 4 (four) hours as needed for moderate pain. Patient not taking: Reported on 06/13/2019 06/19/17   Norm Parcel, PA-C  polyethylene glycol [facility] / GLYCOLAX) packet Take 17 g by mouth daily. Patient not taking: Reported on 06/13/2019 06/19/17   Norm Parcel, PA-C    Allergies    Patient has no  known allergies.  Review of Systems   Review of Systems  Constitutional: Negative for chills and fever.  Gastrointestinal: Negative for diarrhea, nausea and vomiting.  Genitourinary: Negative for decreased urine volume, difficulty urinating, penile pain, scrotal swelling and testicular pain.       + left inguinal painless knot  All other systems reviewed and are negative.   Physical Exam Updated Vital Signs BP 109/75 (BP Location: Left Arm)   Pulse 69   Temp 98 F (36.7 C) (Oral)   Resp 16   SpO2 100%   Physical Exam Vitals and nursing note reviewed.  Constitutional:      Appearance: He is not ill-appearing or diaphoretic.  HENT:      Head: Normocephalic and atraumatic.  Eyes:     Conjunctiva/sclera: Conjunctivae normal.  Cardiovascular:     Rate and Rhythm: Normal rate and regular rhythm.  Pulmonary:     Effort: Pulmonary effort is normal.     Breath sounds: Normal breath sounds. No wheezing, rhonchi or rales.  Abdominal:     Palpations: Abdomen is soft.     Tenderness: There is abdominal tenderness. There is no guarding or rebound.     Comments: Soft, + LLQ abdominal TTP, +BS throughout, no r/g/r, neg murphy's, neg mcburney's, no CVA TTP  Genitourinary:      Comments: Pt with a 2 x 2 cm mobile mass to the left inguinal area without TTP. No overlying skin changes including erythema or increased warmth to the touch. Does not involve the testicles.  Musculoskeletal:     Cervical back: Neck supple.  Skin:    General: Skin is warm and dry.  Neurological:     Mental Status: He is alert.     ED Results / Procedures / Treatments   Labs (all labs ordered are listed, but only abnormal results are displayed) Labs Reviewed  COMPREHENSIVE METABOLIC PANEL - Abnormal; Notable for the following components:      Result Value   Glucose, Bld 104 (*)    Albumin 3.4 (*)    All other components within normal limits  CBC WITH DIFFERENTIAL/PLATELET - Abnormal; Notable for the following components:   RBC 4.17 (*)    All other components within normal limits  URINALYSIS, ROUTINE W REFLEX MICROSCOPIC - Abnormal; Notable for the following components:   Leukocytes,Ua SMALL (*)    All other components within normal limits  LIPASE, BLOOD    EKG None  Radiology No results found.  Procedures Procedures (including critical care time)  Medications Ordered in ED Medications - No data to display  ED Course  I have reviewed the triage vital signs and the nursing notes.  Pertinent labs & imaging results that were available during my care of the patient were reviewed by me and considered in my medical decision making (see  chart for details).    MDM Rules/Calculators/A&P                          54 year old male who is otherwise in good health presents the ED today with a painless not and he noticed in his left groin 10 days ago that is slowly grown in size.  Do some heavy lifting, cannot think of specific injury that occurred as he noticed the knot several hours after heavy lifting.  On arrival to the ED patient is afebrile, nontachycardic and nontachypneic.  He appears to be in no acute distress.  He does have a 2  x 2 centimeter mobile mass to the left inguinal area without tenderness to palpation or overlying skin changes.  He does have a history of inguinal hernia on right side however does not feel like a typical hernia with palpation.  Would suspect overlying skin changes with strangulated or incarcerated hernia however this is certainly in the differential.  Of note patient has some left lower quadrant tenderness palpation that he was not aware of.  He denies fevers, chills, nausea, vomiting, diarrhea, unexpected weight loss.  Given abdominal pain will obtain screening labs at this time.  May consider CT scan to determine etiology of the mass.   Labwork reassuring at this time. No leukocytosis on CBC. Hgb stable. CMP and Lipase within normal limits. U/A without infection.   Attending physician Dr. Kathrynn Humble has evaluated patient as well, concern for lymphadenitis. Will cover with doxycycline. Pt to follow up with [facility] for primary care needs. Strict return precautions discussed with pt including worsening pain, lymphnode growing in size, fevers, skin changes. Pt is in agreement with plan and stable for discharge home.   This note was prepared using Dragon voice recognition software and may include unintentional dictation errors due to the inherent limitations of voice recognition software.  Final Clinical Impression(s) / ED Diagnoses Final diagnoses:  Lymphadenitis    Rx / DC Orders ED  Discharge Orders         Ordered    doxycycline (VIBRAMYCIN) 100 MG capsule  2 times daily     Discontinue  Reprint     07/02/19 1116           Discharge Instructions     Your symptoms are likely related to inflammation of your lymph nodes on the left side of your body Please pick up antibiotics and take as prescribed to cover for a possible infection You can take 600 mg Ibuprofen every 6-8  hours as needed for pain Follow up with your PCP - if you do not have one you can follow up with [facility] and Wellness for your primary care needs Return to the ED IMMEDIATELY for any worsening symptoms including worsening pain, fevers > 100.4, enlarging lymph node despite antibiotics, skin changes overlying the area, or any other new/concerning symptoms       Eustaquio Maize, PA-C 07/02/19 1117    Varney Biles, MD 07/03/19 1516

## 2019-07-02 NOTE — Discharge Instructions (Signed)
Your symptoms are likely related to inflammation of your lymph nodes on the left side of your body Please pick up antibiotics and take as prescribed to cover for a possible infection You can take 600 mg Ibuprofen every 6-8  hours as needed for pain Follow up with your PCP - if you do not have one you can follow up with [facility] and Well[facility] for your primary care needs Return to the ED IMMEDIATELY for any worsening symptoms including worsening pain, fevers > 100.4, enlarging lymph node despite antibiotics, skin changes overlying the area, or any other new/concerning symptoms

## 2019-07-02 NOTE — ED Triage Notes (Signed)
Pt here for eval of a painless "knot" that developed in his L groin ten days ago. Sts he is trying ice and heat for relief of swelling with no improvement.

## 2019-07-25 ENCOUNTER — Emergency Department (HOSPITAL_COMMUNITY)
Admission: EM | Admit: 2019-07-25 | Discharge: 2019-07-25 | Disposition: A | Discharge status: Home / Self Care | Payer: Self-pay | Attending: Emergency Medicine | Admitting: Emergency Medicine

## 2019-07-25 ENCOUNTER — Other Ambulatory Visit: Payer: Self-pay

## 2019-07-25 ENCOUNTER — Encounter (HOSPITAL_COMMUNITY): Payer: Self-pay

## 2019-07-25 DIAGNOSIS — R21 Rash and other nonspecific skin eruption: Secondary | ICD-10-CM | POA: Insufficient documentation

## 2019-07-25 DIAGNOSIS — R591 Generalized enlarged lymph nodes: Secondary | ICD-10-CM | POA: Insufficient documentation

## 2019-07-25 DIAGNOSIS — Z87891 Personal history of nicotine dependence: Secondary | ICD-10-CM | POA: Insufficient documentation

## 2019-07-25 LAB — CBC WITH DIFFERENTIAL/PLATELET
Abs Immature Granulocytes: 0 10*3/uL (ref 0.00–0.07)
Basophils Absolute: 0 10*3/uL (ref 0.0–0.1)
Basophils Relative: 1 %
Eosinophils Absolute: 0.3 10*3/uL (ref 0.0–0.5)
Eosinophils Relative: 7 %
HCT: 37.6 % — ABNORMAL LOW (ref 39.0–52.0)
Hemoglobin: 12.2 g/dL — ABNORMAL LOW (ref 13.0–17.0)
Immature Granulocytes: 0 %
Lymphocytes Relative: 53 %
Lymphs Abs: 2.5 10*3/uL (ref 0.7–4.0)
MCH: 31.7 pg (ref 26.0–34.0)
MCHC: 32.4 g/dL (ref 30.0–36.0)
MCV: 97.7 fL (ref 80.0–100.0)
Monocytes Absolute: 0.4 10*3/uL (ref 0.1–1.0)
Monocytes Relative: 9 %
Neutro Abs: 1.4 10*3/uL — ABNORMAL LOW (ref 1.7–7.7)
Neutrophils Relative %: 30 %
Platelets: 283 10*3/uL (ref 150–400)
RBC: 3.85 MIL/uL — ABNORMAL LOW (ref 4.22–5.81)
RDW: 13.9 % (ref 11.5–15.5)
WBC: 4.6 10*3/uL (ref 4.0–10.5)
nRBC: 0 % (ref 0.0–0.2)

## 2019-07-25 LAB — RAPID HIV SCREEN (HIV 1/2 AB+AG)
HIV 1/2 Antibodies: NONREACTIVE
HIV-1 P24 Antigen - HIV24: NONREACTIVE

## 2019-07-25 NOTE — ED Provider Notes (Signed)
[facility] EMERGENCY DEPARTMENT Provider Note   CSN: [id number] Arrival date & time: 07/25/19  7619     History Chief Complaint  Patient presents with   Rash   Lymphadenopathy    Tanner Gallagher is a 54 y.o. male.  54 year old male returns with ongoing left inguinal lymphadenopathy, also with rash to trunk and upper extremities for the past 2 weeks.  Patient was seen in this ER on July 02, 2019 with an approximately 2 cm left inguinal lymph node, had labs checked and was given a course of doxycycline.  Patient was referred to [facility] health and wellness however has not followed up.  Patient states that he thinks the lymph node is getting bigger and has now developed this rash which is nonpainful, nonpruritic.  No recent travel, no exposure, no changes in detergents or soaps, denies urethral discharge.  No other complaints or concerns today.        Past Medical History:  Diagnosis Date   Medical history non-contributory     Patient Active Problem List   Diagnosis Date Noted   Perirectal abscess 06/16/2017    Past Surgical History:  Procedure Laterality Date   INCISION AND DRAINAGE PERIRECTAL ABSCESS  06/16/2017   INCISION AND DRAINAGE PERIRECTAL ABSCESS N/A 06/16/2017   Procedure: IRRIGATION AND DEBRIDEMENT PERIRECTAL ABSCESS;  Surgeon: Coralie Keens, MD;  Location: [facility];  Service: General;  Laterality: N/A;   INGUINAL HERNIA REPAIR Right 2000s   PROCTOSCOPY  06/16/2017   Procedure: RIGID PROCTOSCOPY;  Surgeon: Coralie Keens, MD;  Location: [facility];  Service: General;;       No family history on file.  Social History   Tobacco Use   Smoking status: Former Smoker    Packs/day: 0.12    Years: 37.00    Pack years: 4.44    Types: Cigarettes    Quit date: 05/20/2017    Years since quitting: 2.1   Smokeless tobacco: Never Used  Vaping Use   Vaping Use: Never used  Substance Use Topics   Alcohol use: Not Currently   Drug use: Yes     Types: Marijuana    Comment: 06/16/2017 "weekly"    Home Medications Prior to Admission medications   Medication Sig Start Date End Date Taking? Authorizing Provider  docusate sodium (COLACE) 100 MG capsule Take 2 capsules (200 mg total) by mouth daily. Patient not taking: Reported on 06/13/2019 06/19/17   Norm Parcel, PA-C  oxyCODONE (OXY IR/ROXICODONE) 5 MG immediate release tablet Take 1-2 tablets (5-10 mg total) by mouth every 4 (four) hours as needed for moderate pain. Patient not taking: Reported on 06/13/2019 06/19/17   Norm Parcel, PA-C  polyethylene glycol [facility] / GLYCOLAX) packet Take 17 g by mouth daily. Patient not taking: Reported on 06/13/2019 06/19/17   Norm Parcel, PA-C    Allergies    Patient has no known allergies.  Review of Systems   Review of Systems  Constitutional: Negative for fever.  Gastrointestinal: Negative for abdominal pain, constipation, diarrhea, nausea and vomiting.  Musculoskeletal: Negative for arthralgias and myalgias.  Skin: Positive for rash.  Allergic/Immunologic: Negative for immunocompromised state.  Hematological: Positive for adenopathy.  All other systems reviewed and are negative.   Physical Exam Updated Vital Signs BP 122/89    Pulse (!) 48    Temp 98.8 F (37.1 C)    Resp 20    Ht '5\' 8"'$  (1.727 m)    Wt 68 kg    SpO2  100%    BMI 22.81 kg/m   Physical Exam Vitals and nursing note reviewed. Exam conducted with a chaperone present.  Constitutional:      General: He is not in acute distress.    Appearance: He is well-developed. He is not diaphoretic.  HENT:     Head: Normocephalic and atraumatic.  Pulmonary:     Effort: Pulmonary effort is normal.  Abdominal:     Palpations: Abdomen is soft.     Tenderness: There is no abdominal tenderness.  Musculoskeletal:     Right lower leg: No edema.     Left lower leg: No edema.  Lymphadenopathy:     Comments: 4cm x 2cm left inguinal LN with second node palpated cranially to  this and is approximately 2cm x 2cm.  No overlying erythema.  Skin:    Findings: Rash present.     Comments: Papular rash scattered to trunk and upper extremities, does not appear to involve lower extremities, no overlying erythema, no vesicles.  Neurological:     Mental Status: He is alert and oriented to person, place, and time.  Psychiatric:        Behavior: Behavior normal.       ED Results / Procedures / Treatments   Labs (all labs ordered are listed, but only abnormal results are displayed) Labs Reviewed  CBC WITH DIFFERENTIAL/PLATELET - Abnormal; Notable for the following components:      Result Value   RBC 3.85 (*)    Hemoglobin 12.2 (*)    HCT 37.6 (*)    Neutro Abs 1.4 (*)    All other components within normal limits  RAPID HIV SCREEN (HIV 1/2 AB+AG)  RPR  GC/CHLAMYDIA PROBE AMP ([place]) NOT AT [facility]    EKG None  Radiology No results found.  Procedures Procedures (including critical care time)  Medications Ordered in ED Medications - No data to display  ED Course  I have reviewed the triage vital signs and the nursing notes.  Pertinent labs & imaging results that were available during my care of the patient were reviewed by me and considered in my medical decision making (see chart for details).  Clinical Course as of Jul 25 1327  Mon Jul 25, 6123  764 54 year old male with enlarging left inguinal lymph node, now with palpable secondary lymph node.  Also with a papular rash to his trunk and upper extremities.  Case discussed with Dr. Tyrone Nine.  Labs rechecked, no significant changes to his CBC, HIV is nonreactive, RPR pending as well as gonorrhea chlamydia due to enlarged lymph node.  Patient has met with the social worker with plan to follow-up with [facility] and wellness, call tomorrow to schedule an appointment and request orange card.  Patient understands that his condition requires further testing in the outpatient setting, may need referral to  general surgery for biopsy of the lymph node, this will be coordinated by the PCP.  Return to ER for new or worsening symptoms.   [LM]    Clinical Course User Index [LM] Roque Lias   MDM Rules/Calculators/A&P                          Final Clinical Impression(s) / ED Diagnoses Final diagnoses:  Lymphadenopathy  Rash    Rx / DC Orders ED Discharge Orders    None       Tacy Learn, PA-C 07/25/19 1329    Deno Etienne, DO  07/25/19 1401 ° °

## 2019-07-25 NOTE — ED Triage Notes (Signed)
Pt reports continued pain in his left groin, was seen here 2 weeks ago and was told it was a swollen lymph node. Pt also c.o rash to his lower back and arm for the past 2 weeks, no itching.

## 2019-07-25 NOTE — Social Work (Signed)
CSW met with Pt at bedside to provide resources for PCP.  CSW was unable to make appointment for Pt due to holiday hours, but did provide Pt with [place] brochure and counseled Pt to apply for [facility] Card to help with financial issues.

## 2019-07-25 NOTE — Discharge Instructions (Addendum)
Call for a PCP appointment tomorrow as discussed. Return to ER for new or worsening symptoms.

## 2019-07-26 ENCOUNTER — Telehealth: Payer: Self-pay | Admitting: *Deleted

## 2019-07-26 LAB — GC/CHLAMYDIA PROBE AMP (~~LOC~~) NOT AT ARMC
Chlamydia: NEGATIVE
Comment: NEGATIVE
Comment: NORMAL
Neisseria Gonorrhea: NEGATIVE

## 2019-07-26 LAB — RPR: RPR Ser Ql: NONREACTIVE

## 2019-07-26 NOTE — Telephone Encounter (Signed)
Tanner Gallagher J. Clydene Laming, [place], BSN, [place]  RNCM set up appointment with [facility] on 7/19 @ 1050.  Spoke with pt at bedside and advised to please arrive 15 min early and take a picture ID and your current medications.  Pt verbalizes understanding of keeping appointment.

## 2019-08-08 ENCOUNTER — Encounter (INDEPENDENT_AMBULATORY_CARE_PROVIDER_SITE_OTHER): Payer: Self-pay | Admitting: Primary Care

## 2019-08-08 ENCOUNTER — Other Ambulatory Visit: Payer: Self-pay

## 2019-08-08 ENCOUNTER — Ambulatory Visit (INDEPENDENT_AMBULATORY_CARE_PROVIDER_SITE_OTHER): Payer: Self-pay | Admitting: Primary Care

## 2019-08-08 VITALS — BP 97/60 | HR 67 | Temp 97.8°F | Ht 69.0 in | Wt 143.0 lb

## 2019-08-08 DIAGNOSIS — Z7689 Persons encountering health services in other specified circumstances: Secondary | ICD-10-CM

## 2019-08-08 DIAGNOSIS — Z125 Encounter for screening for malignant neoplasm of prostate: Secondary | ICD-10-CM

## 2019-08-08 DIAGNOSIS — Z09 Encounter for follow-up examination after completed treatment for conditions other than malignant neoplasm: Secondary | ICD-10-CM

## 2019-08-08 DIAGNOSIS — R21 Rash and other nonspecific skin eruption: Secondary | ICD-10-CM

## 2019-08-08 DIAGNOSIS — Z1211 Encounter for screening for malignant neoplasm of colon: Secondary | ICD-10-CM

## 2019-08-08 NOTE — Patient Instructions (Signed)

## 2019-08-08 NOTE — Progress Notes (Signed)
History and Physical  [facility]    History and Physical:    Tanner Gallagher   TDV:[id number] DOB: 1965/02/18 DOA: (Not on file)  PCP: Patient, No Pcp Per   Chief Complaint: establish care  History of Present Illness:   [name] is an 54 y.o. male that present for establishment of care. Seen in the emergency room on 07/25/19 left inguinal lymphadenopathy .  ROS:   ROS  Constitutional: No fever, no chills;  Appetite normal; No weight loss, no weight gain, no fatigue.   HEENT: No blurry vision, no diplopia, no pharyngitis, no dysphagia  CV: No chest pain, no palpitations, no PND, no orthopnea, no edema.   Resp: No SOB, no cough, no pleuritic pain.  GI: No nausea, no vomiting, no diarrhea, no melena, no hematochezia, no constipation, no abdominal pain.   GU: No dysuria, no hematuria, no frequency, no urgency.  MSK: No myalgias, no arthralgias.   Neuro:  No headache, no focal neurological deficits, no history of seizures.   Psych: No depression, no anxiety.   Endo: No heat intolerance, no cold intolerance, no polyuria, no polydipsia   Skin: papular rash/    Heme: No easy bruising.   Travel history: No recent travel.   Past Medical History:   Past Medical History:  Diagnosis Date  . Medical history non-contributory     Past Surgical History:   Past Surgical History:  Procedure Laterality Date  . INCISION AND DRAINAGE PERIRECTAL ABSCESS  06/16/2017  . INCISION AND DRAINAGE PERIRECTAL ABSCESS N/A 06/16/2017   Procedure: IRRIGATION AND DEBRIDEMENT PERIRECTAL ABSCESS;  Surgeon: Tanner Keens, MD;  Location: [facility];  Service: General;  Laterality: N/A;  . INGUINAL HERNIA REPAIR Right 2000s  . PROCTOSCOPY  06/16/2017   Procedure: RIGID PROCTOSCOPY;  Surgeon: Tanner Keens, MD;  Location: [facility];  Service: General;;    Social History:   Social History   Socioeconomic History  . Marital status: Married    Spouse name: Not on file  . Number of  children: Not on file  . Years of education: Not on file  . Highest education level: Not on file  Occupational History  . Not on file  Tobacco Use  . Smoking status: Former Smoker    Packs/day: 0.12    Years: 37.00    Pack years: 4.44    Types: Cigarettes    Quit date: 05/20/2017    Years since quitting: 2.2  . Smokeless tobacco: Never Used  Vaping Use  . Vaping Use: Never used  Substance and Sexual Activity  . Alcohol use: Not Currently  . Drug use: Yes    Types: Marijuana    Comment: 06/16/2017 "weekly"  . Sexual activity: Yes  Other Topics Concern  . Not on file  Social History Narrative  . Not on file   Social Determinants of Health   Financial Resource Strain:   . Difficulty of Paying Living Expenses:   Food Insecurity:   . Worried About Charity fundraiser in the Last Year:   . Arboriculturist in the Last Year:   Transportation Needs:   . Film/video editor (Medical):   Marland Kitchen Lack of Transportation (Non-Medical):   Physical Activity:   . Days of Exercise per Week:   . Minutes of Exercise per Session:   Stress:   . Feeling of Stress :   Social Connections:   . Frequency of Communication with Friends and Family:   . Frequency of  Social Gatherings with Friends and Family:   . Attends Religious Services:   . Active Member of Clubs or Organizations:   . Attends Archivist Meetings:   Marland Kitchen Marital Status:   Intimate Partner Violence:   . Fear of Current or Ex-Partner:   . Emotionally Abused:   Marland Kitchen Physically Abused:   . Sexually Abused:    Family history:   Family History  Problem Relation Age of Onset  . Diabetes Neg Hx     Allergies   Patient has no known allergies.  Current Medications:   Prior to Admission medications   Medication Sig Start Date End Date Taking? Authorizing Provider  docusate sodium (COLACE) 100 MG capsule Take 2 capsules (200 mg total) by mouth daily. Patient not taking: Reported on 06/13/2019 06/19/17   Tanner Parcel,  PA-C  oxyCODONE (OXY IR/ROXICODONE) 5 MG immediate release tablet Take 1-2 tablets (5-10 mg total) by mouth every 4 (four) hours as needed for moderate pain. Patient not taking: Reported on 06/13/2019 06/19/17   Tanner Parcel, PA-C  polyethylene glycol [facility] / GLYCOLAX) packet Take 17 g by mouth daily. Patient not taking: Reported on 06/13/2019 06/19/17   Tanner Parcel, PA-C    Physical Exam:   Vitals:   08/08/19 1050  BP: 97/60  Pulse: 67  Temp: 97.8 F (36.6 C)  TempSrc: Oral  SpO2: 98%  Weight: 143 lb (64.9 kg)  Height: 5\' 9"  (1.753 m)     Physical Exam: Blood pressure 97/60, pulse 67, temperature 97.8 F (36.6 C), temperature source Oral, height 5\' 9"  (1.753 m), weight 143 lb (64.9 kg), SpO2 98 %. Gen: No acute distress. Head: Normocephalic, atraumatic. Eyes: PERRL, EOMI, sclerae juandice  Neck: Supple, no thyromegaly, no lymphadenopathy, no jugular venous distention. Chest: Respiratory effort normal, BS equal bilaterally without rales, rhonchi, wheezing or stridor. CV: RRR with no MRGs. Brisk peripheral pulses without edema.  Abdomen: Soft, nontender, nondistended with normal active bowel sounds. Extremities: Extremities Skin: Warm and dry.papular rash trunk,back Neuro: Alert and oriented times 4; cranial nerves II through XII grossly intact. Psych: Mood and affect normal.   Data Review:   Labs: Basic Metabolic Panel: No results for input(s): NA, K, CL, CO2, GLUCOSE, BUN, CREATININE, CALCIUM, MG, PHOS in the last 168 hours. Liver Function Tests: No results for input(s): AST, ALT, ALKPHOS, BILITOT, PROT, ALBUMIN in the last 168 hours. No results for input(s): LIPASE, AMYLASE in the last 168 hours. No results for input(s): AMMONIA in the last 168 hours. CBC: No results for input(s): WBC, NEUTROABS, HGB, HCT, MCV, PLT in the last 168 hours. Cardiac Enzymes: No results for input(s): CKTOTAL, CKMB, CKMBINDEX, TROPONINI in the last 168 hours.  BNP (last 3  results) No results for input(s): PROBNP in the last 8760 hours. CBG: No results for input(s): GLUCAP in the last 168 hours.  Radiographic Studies: No results found.   Assessment/Plan:  Tanner Gallagher was seen today for new patient (initial visit).  Diagnoses and all orders for this visit:  Encounter to establish care Juluis Mire, NP-C will be your  (PCP) she is mastered prepared . She is skilled to diagnosed and treat illness. Also able to answer health concern as well as continuing care of varied medical conditions, not limited by cause, organ system, or diagnosis.    Prostate cancer screening -     PSA; Future  Hospital discharge follow-up Patient is seen in the emergency room on 3 separate occasions first Jun 13, 2019 chief  complaint and diagnoses acute nonintractable headaches CT performed and was negative for acute abnormalities.  July 02, 2019 chief complaint painless not from the left inguinal area noted 2 x 2 cm mobile mass.  Treated with doxycycline for possible infection recommended to establish care.  Diagnosis Lymphadenitis.  July 25, 2019 chief complaint left inguinal lymphadenopathy and a rash to trunk and upper extremities present for the last 2 weeks.  Recommend follow-up to establish care.  Recommended biopsy of the lymph node in the inguinal area by general surgery  Colon cancer screening -     Fecal occult blood, imunochemical; Future   Diffuse papular rash     Kerin Perna

## 2019-08-15 ENCOUNTER — Other Ambulatory Visit: Payer: Self-pay

## 2019-08-15 ENCOUNTER — Other Ambulatory Visit (INDEPENDENT_AMBULATORY_CARE_PROVIDER_SITE_OTHER): Payer: Self-pay

## 2019-08-15 ENCOUNTER — Telehealth (INDEPENDENT_AMBULATORY_CARE_PROVIDER_SITE_OTHER): Payer: Self-pay

## 2019-08-15 DIAGNOSIS — Z125 Encounter for screening for malignant neoplasm of prostate: Secondary | ICD-10-CM

## 2019-08-15 NOTE — Telephone Encounter (Signed)
Patient came into clinic for blood work and stated PCP would prescribe  medication for his inguinal lymph node to helps with the swelling.  Patient uses   [facility]   Please advice [phone]

## 2019-08-16 LAB — PSA: Prostate Specific Ag, Serum: 0.7 ng/mL (ref 0.0–4.0)

## 2019-08-18 ENCOUNTER — Telehealth (INDEPENDENT_AMBULATORY_CARE_PROVIDER_SITE_OTHER): Payer: Self-pay

## 2019-08-18 NOTE — Telephone Encounter (Signed)
-----   Message from Kerin Perna, NP sent at 08/16/2019  8:16 PM EDT ----- Your prostate specific antigen is normal at this time will check yearly

## 2019-08-18 NOTE — Telephone Encounter (Signed)
Patient verified date of birth; he is aware of normal PSA. Nat Christen, CMA

## 2019-08-24 ENCOUNTER — Ambulatory Visit: Payer: Self-pay

## 2019-08-24 NOTE — Telephone Encounter (Signed)
Pt. Reports he has swollen lymph nodes to both groin ares x 1 month. The left side is worse. No fever or other symptoms other than pain. No availability I the practice today or tomorrow. Pt. States he will go to ED.  Reason for Disposition . [1] Very tender to the touch AND [2] no fever  Answer Assessment - Initial Assessment Questions 1. LOCATION: "Where is the swollen node located?" "Is the matching node on the other side of the body also swollen?"      Groin 2. SIZE: "How big is the node?" (Inches or centimeters) (or compare to common objects such as pea, bean, marble, golf ball)      Marble size 3. ONSET: "When did the swelling start?"      1 month ago 4. NECK NODES: "Is there a sore throat, runny nose or other symptoms of a cold?"      No 5. GROIN OR ARMPIT NODES: "Is there a sore, scratch, cut or painful red area on that arm or leg?"      No 6. FEVER: "Do you have a fever?" If Yes, ask: "What is it, how was it measured, and when did it start?"      No 7. CAUSE: "What do you think is causing the swollen lymph nodes?"     Unsure 8. OTHER SYMPTOMS: "Do you have any other symptoms?"     Pain to area 9. PREGNANCY: "Is there any chance you are pregnant?" "When was your last menstrual period?"     n/a  Protocols used: LYMPH NODES - [facility]

## 2019-09-21 ENCOUNTER — Ambulatory Visit (INDEPENDENT_AMBULATORY_CARE_PROVIDER_SITE_OTHER): Payer: Self-pay | Admitting: Primary Care

## 2019-09-23 ENCOUNTER — Other Ambulatory Visit: Payer: Self-pay

## 2019-09-23 ENCOUNTER — Ambulatory Visit (INDEPENDENT_AMBULATORY_CARE_PROVIDER_SITE_OTHER): Payer: Self-pay | Admitting: Primary Care

## 2019-09-23 ENCOUNTER — Encounter (INDEPENDENT_AMBULATORY_CARE_PROVIDER_SITE_OTHER): Payer: Self-pay | Admitting: Primary Care

## 2019-09-23 VITALS — BP 112/73 | HR 73 | Temp 98.1°F | Resp 16 | Ht 69.0 in | Wt 140.0 lb

## 2019-09-23 DIAGNOSIS — R599 Enlarged lymph nodes, unspecified: Secondary | ICD-10-CM

## 2019-09-23 NOTE — Progress Notes (Signed)
Established Patient Office Visit  Subjective:  Patient ID: Tanner Gallagher, male    DOB: [birth year]  Age: 54 y.o. MRN: [id number]  CC: No chief complaint on file.   HPI [name] presents for recurrent lymphadenopathy denies groin pain or testicular pain but has notice increasing in size. Pain in inguinal area bilateral   Past Medical History:  Diagnosis Date  . Medical history non-contributory     Past Surgical History:  Procedure Laterality Date  . INCISION AND DRAINAGE PERIRECTAL ABSCESS  06/16/2017  . INCISION AND DRAINAGE PERIRECTAL ABSCESS N/A 06/16/2017   Procedure: IRRIGATION AND DEBRIDEMENT PERIRECTAL ABSCESS;  Surgeon: Coralie Keens, MD;  Location: [facility];  Service: General;  Laterality: N/A;  . INGUINAL HERNIA REPAIR Right 2000s  . PROCTOSCOPY  06/16/2017   Procedure: RIGID PROCTOSCOPY;  Surgeon: Coralie Keens, MD;  Location: [facility];  Service: General;;    Family History  Problem Relation Age of Onset  . Diabetes Neg Hx     Social History   Socioeconomic History  . Marital status: Married    Spouse name: Not on file  . Number of children: Not on file  . Years of education: Not on file  . Highest education level: Not on file  Occupational History  . Not on file  Tobacco Use  . Smoking status: Former Smoker    Packs/day: 0.12    Years: 37.00    Pack years: 4.44    Types: Cigarettes    Quit date: 05/20/2017    Years since quitting: 2.3  . Smokeless tobacco: Never Used  Vaping Use  . Vaping Use: Never used  Substance and Sexual Activity  . Alcohol use: Not Currently  . Drug use: Yes    Types: Marijuana    Comment: 06/16/2017 "weekly"  . Sexual activity: Yes  Other Topics Concern  . Not on file  Social History Narrative  . Not on file   Social Determinants of Health   Financial Resource Strain:   . Difficulty of Paying Living Expenses: Not on file  Food Insecurity:   . Worried About Charity fundraiser in the Last Year: Not on file  . Ran  Out of Food in the Last Year: Not on file  Transportation Needs:   . Lack of Transportation (Medical): Not on file  . Lack of Transportation (Non-Medical): Not on file  Physical Activity:   . Days of Exercise per Week: Not on file  . Minutes of Exercise per Session: Not on file  Stress:   . Feeling of Stress : Not on file  Social Connections:   . Frequency of Communication with Friends and Family: Not on file  . Frequency of Social Gatherings with Friends and Family: Not on file  . Attends Religious Services: Not on file  . Active Member of Clubs or Organizations: Not on file  . Attends Archivist Meetings: Not on file  . Marital Status: Not on file  Intimate Partner Violence:   . Fear of Current or Ex-Partner: Not on file  . Emotionally Abused: Not on file  . Physically Abused: Not on file  . Sexually Abused: Not on file    Outpatient Medications Prior to Visit  Medication Sig Dispense Refill  . docusate sodium (COLACE) 100 MG capsule Take 2 capsules (200 mg total) by mouth daily. (Patient not taking: Reported on 06/13/2019) 10 capsule 0  . oxyCODONE (OXY IR/ROXICODONE) 5 MG immediate release tablet Take 1-2 tablets (5-10 mg total) by  mouth every 4 (four) hours as needed for moderate pain. (Patient not taking: Reported on 06/13/2019) 20 tablet 0  . polyethylene glycol (MIRALAX / GLYCOLAX) packet Take 17 g by mouth daily. (Patient not taking: Reported on 06/13/2019) 14 each 0   No facility-administered medications prior to visit.    No Known Allergies  ROS Review of Systems  Genitourinary:       Pain in inguinal area   All other systems reviewed and are negative.     Objective:    Physical Exam Vitals reviewed.  HENT:     Nose: Nose normal.  Cardiovascular:     Rate and Rhythm: Normal rate and regular rhythm.     Pulses: Normal pulses.     Heart sounds: Normal heart sounds.  Pulmonary:     Effort: Pulmonary effort is normal.     Breath sounds: Normal  breath sounds.  Genitourinary:    Comments: Lympathic inguinal enlargement with scant drainage has to use bandage    Musculoskeletal:        General: Normal range of motion.     Cervical back: Normal range of motion.  Skin:    General: Skin is warm and dry.  Neurological:     Mental Status: He is alert and oriented to person, place, and time.  Psychiatric:        Mood and Affect: Mood normal.        Behavior: Behavior normal.        Thought Content: Thought content normal.        Judgment: Judgment normal.     BP 112/73   Pulse 73   Temp 98.1 F (36.7 C)   Resp 16   Ht 5\' 9"  (1.753 m)   Wt 140 lb (63.5 kg)   SpO2 100%   BMI 20.67 kg/m  Wt Readings from Last 3 Encounters:  09/23/19 140 lb (63.5 kg)  08/08/19 143 lb (64.9 kg)  07/25/19 150 lb (68 kg)     Health Maintenance Due  Topic Date Due  . Hepatitis C Screening  Never done  . COVID-19 Vaccine (1) Never done  . INFLUENZA VACCINE  Never done    There are no preventive care reminders to display for this patient.  No results found for: TSH Lab Results  Component Value Date   WBC 4.6 07/25/2019   HGB 12.2 (L) 07/25/2019   HCT 37.6 (L) 07/25/2019   MCV 97.7 07/25/2019   PLT 283 07/25/2019   Lab Results  Component Value Date   NA 137 07/02/2019   K 4.6 07/02/2019   CO2 25 07/02/2019   GLUCOSE 104 (H) 07/02/2019   BUN 12 07/02/2019   CREATININE 0.88 07/02/2019   BILITOT 0.7 07/02/2019   ALKPHOS 48 07/02/2019   AST 16 07/02/2019   ALT 18 07/02/2019   PROT 6.9 07/02/2019   ALBUMIN 3.4 (L) 07/02/2019   CALCIUM 8.9 07/02/2019   ANIONGAP 6 07/02/2019   No results found for: CHOL No results found for: HDL No results found for: LDLCALC No results found for: TRIG No results found for: CHOLHDL No results found for: HGBA1C    Assessment & Plan:  Diagnoses and all orders for this visit:  Enlarged lymph nodes With inguinal pain with drainage  -     Ambulatory referral to Hematology / Oncology   No  orders of the defined types were placed in this encounter.   Follow-up: No follow-ups on file.    Kerin Perna,  NP

## 2019-09-23 NOTE — Progress Notes (Signed)
Recurrent Lymphoadenopaty

## 2019-09-23 NOTE — Patient Instructions (Signed)

## 2019-09-29 ENCOUNTER — Telehealth: Payer: Self-pay | Admitting: Hematology and Oncology

## 2019-09-29 NOTE — Telephone Encounter (Signed)
Received a new pt referral from [place] for enlarged lymph nodes. Pt has been cld and scheduled to see Dr. Lorenso Courier on 9/22 at 2pm. Pt aware to arrive 20 minutes early.

## 2019-10-03 ENCOUNTER — Other Ambulatory Visit: Payer: Self-pay

## 2019-10-03 ENCOUNTER — Ambulatory Visit: Payer: Self-pay | Attending: Primary Care

## 2019-10-07 ENCOUNTER — Telehealth: Payer: Self-pay | Admitting: Primary Care

## 2019-10-07 NOTE — Telephone Encounter (Signed)
I called Pt, LVM inform him that I received the rest of the taxes that show a bank acct ending on 3154 from [place], that is not the same acct number he submit, need the 3 month bank statements to complete his application

## 2019-10-11 NOTE — Progress Notes (Signed)
[place] [phone]   Fax:([phone] [place] NOTE  Patient Care Team: Kerin Perna, NP as PCP - General (Internal Medicine)  Hematological/Oncological History #Persistent Bilateral Inguinal Lymphadenopathy 1) 07/25/2019: presented to the ED with ongoing left inguinal lymphadenopathy. Previously seen in June 2021 and given course of doxycycline. Infectious workup including RPR, HIV, GC/chlamydia were negative.  2) 09/23/2019: presented to PCP with concern for bilaterally enlarged lymph nodes of the groin. Noted to have scant drainage 3) 10/12/2019: establish care with Dr. Lorenso Courier   CHIEF COMPLAINTS/PURPOSE OF CONSULTATION:  "Inguinal Lymphadenopathy "  HISTORY OF PRESENTING ILLNESS:  Tanner Gallagher 54 y.o. male with no significant medical history presents for evaluation of bilateral lymphadenopathy.   On review of the previous records Tanner Gallagher had a perianal abscess in 06/16/2017, noted to be up to 8 cm in diameter. He underwent I/D at that time.  Further records show that he presented to the emergency department in June of 2021 with ongoing left inguinal lymphadenopathy.  At that time he was found to have negative RPR, HIV, GC, [place], and was prescribed a short course of doxycycline.  When this failed to resolve he presented to his PCP on 09/23/2019.  He was reported to have had scant drainage at that time and reports that one of the left sided lymph nodes had opened up and drained a considerable amount of pus and blood.  Due to concern for this ongoing bilateral lymphadenopathy he was referred to hematology for further evaluation management.  On exam today Tanner Gallagher notes that this has been going on since at least May 2021.  He notes that it began first with the left side with painful swelling and then subsequently on the right side.  He notes that the antibiotics he was prescribed by the emergency department in June 2021 did not decrease the size of this  lymphadenopathy.  He notes that even though one of the lymph nodes on the left open to drain it only decreased in size minimally but did persist.  It is they are not currently as painful as they were previously.  When the left-sided lymph node did open and drain he reports that there was pus and "a long string of material" that came out.  He reportedly saved this case and he wanted to see it, but he does not have it on him today.  He also has no photographs of the fluid or the string that was removed.    He otherwise denies having any issues with fevers, chills, sweats, nausea, vomiting or diarrhea.  He is not having any other systemic symptoms at this time.  He is not having any other enlarged lymph nodes nor the lesions previously noted on his back.  He notes that his family history is otherwise noncontributory and that he currently works as a Retail buyer of buildings.  A full 10 point ROS is listed below.  MEDICAL HISTORY:  Past Medical History:  Diagnosis Date  . Medical history non-contributory     SURGICAL HISTORY: Past Surgical History:  Procedure Laterality Date  . INCISION AND DRAINAGE PERIRECTAL ABSCESS  06/16/2017  . INCISION AND DRAINAGE PERIRECTAL ABSCESS N/A 06/16/2017   Procedure: IRRIGATION AND DEBRIDEMENT PERIRECTAL ABSCESS;  Surgeon: Coralie Keens, MD;  Location: [facility];  Service: General;  Laterality: N/A;  . INGUINAL HERNIA REPAIR Right 2000s  . PROCTOSCOPY  06/16/2017   Procedure: RIGID PROCTOSCOPY;  Surgeon: Coralie Keens, MD;  Location: [facility];  Service: General;;    SOCIAL HISTORY: Social History   Socioeconomic History  . Marital status: Married    Spouse name: Not on file  . Number of children: Not on file  . Years of education: Not on file  . Highest education level: Not on file  Occupational History  . Not on file  Tobacco Use  . Smoking status: Former Smoker    Packs/day: 0.12    Years: 37.00    Pack years: 4.44    Types:  Cigarettes    Quit date: 05/20/2017    Years since quitting: 2.3  . Smokeless tobacco: Never Used  Vaping Use  . Vaping Use: Never used  Substance and Sexual Activity  . Alcohol use: Not Currently  . Drug use: Yes    Types: Marijuana    Comment: 06/16/2017 "weekly"  . Sexual activity: Yes  Other Topics Concern  . Not on file  Social History Narrative  . Not on file   Social Determinants of Health   Financial Resource Strain:   . Difficulty of Paying Living Expenses: Not on file  Food Insecurity:   . Worried About Charity fundraiser in the Last Year: Not on file  . Ran Out of Food in the Last Year: Not on file  Transportation Needs:   . Lack of Transportation (Medical): Not on file  . Lack of Transportation (Non-Medical): Not on file  Physical Activity:   . Days of Exercise per Week: Not on file  . Minutes of Exercise per Session: Not on file  Stress:   . Feeling of Stress : Not on file  Social Connections:   . Frequency of Communication with Friends and Family: Not on file  . Frequency of Social Gatherings with Friends and Family: Not on file  . Attends Religious Services: Not on file  . Active Member of Clubs or Organizations: Not on file  . Attends Archivist Meetings: Not on file  . Marital Status: Not on file  Intimate Partner Violence:   . Fear of Current or Ex-Partner: Not on file  . Emotionally Abused: Not on file  . Physically Abused: Not on file  . Sexually Abused: Not on file    FAMILY HISTORY: Family History  Problem Relation Age of Onset  . Diabetes Neg Hx     ALLERGIES:  has No Known Allergies.  MEDICATIONS:  No current outpatient medications on file.   No current facility-administered medications for this visit.    REVIEW OF SYSTEMS:   Constitutional: ( - ) fevers, ( - )  chills , ( - ) night sweats Eyes: ( - ) blurriness of vision, ( - ) double vision, ( - ) watery eyes Ears, nose, mouth, throat, and face: ( - ) mucositis, ( - )  sore throat Respiratory: ( - ) cough, ( - ) dyspnea, ( - ) wheezes Cardiovascular: ( - ) palpitation, ( - ) chest discomfort, ( - ) lower extremity swelling Gastrointestinal:  ( - ) nausea, ( - ) heartburn, ( - ) change in bowel habits Skin: ( - ) abnormal skin rashes Lymphatics: ( +++ )  lymphadenopathy, ( - ) easy bruising Neurological: ( - ) numbness, ( - ) tingling, ( - ) new weaknesses Behavioral/Psych: ( - ) mood change, ( - ) new changes  All other systems were reviewed with the patient and are negative.  PHYSICAL EXAMINATION:  Vitals:   10/12/19 1350  BP: 99/63  Pulse: 62  Resp: 18  Temp: (!) 97 F (36.1 C)  SpO2: 100%   Filed Weights   10/12/19 1350  Weight: 142 lb 8 oz (64.6 kg)    GENERAL: well appearing middle aged [place] American male in NAD  SKIN: skin color, texture, turgor are normal, no rashes or significant lesions EYES: conjunctiva are pink and non-injected, sclera clear LYMPH: Markedly enlarged lymph nodes in inguinal region bilaterally. No drainage, otherwise no palpable lymphadenopathy in the cervical, axillary or supraclavicular lymph nodes.  LUNGS: clear to auscultation and percussion with normal breathing effort HEART: regular rate & rhythm and no murmurs and no lower extremity edema ABDOMEN: soft, non-tender, non-distended, normal bowel sounds Musculoskeletal: no cyanosis of digits and no clubbing  PSYCH: alert & oriented x 3, fluent speech NEURO: no focal motor/sensory deficits  LABORATORY DATA:  I have reviewed the data as listed CBC Latest Ref Rng & Units 10/12/2019 07/25/2019 07/02/2019  WBC 4.0 - 10.5 K/uL 5.2 4.6 4.9  Hemoglobin 13.0 - 17.0 g/dL 11.6(L) 12.2(L) 13.5  Hematocrit 39 - 52 % 34.5(L) 37.6(L) 40.8  Platelets 150 - 400 K/uL 275 283 247    CMP Latest Ref Rng & Units 07/02/2019 01/30/2018 06/17/2017  Glucose 70 - 99 mg/dL 104(H) 136(H) 160(H)  BUN 6 - 20 mg/dL 12 17 10   Creatinine 0.61 - 1.24 mg/dL 0.88 0.89 0.97  Sodium 135 - 145  mmol/L 137 137 135  Potassium 3.5 - 5.1 mmol/L 4.6 3.9 4.3  Chloride 98 - 111 mmol/L 106 103 99(L)  CO2 22 - 32 mmol/L 25 27 26   Calcium 8.9 - 10.3 mg/dL 8.9 9.0 7.9(L)  Total Protein 6.5 - 8.1 g/dL 6.9 - -  Total Bilirubin 0.3 - 1.2 mg/dL 0.7 - -  Alkaline Phos 38 - 126 U/L 48 - -  AST 15 - 41 U/L 16 - -  ALT 0 - 44 U/L 18 - -    RADIOGRAPHIC STUDIES: No results found.  ASSESSMENT & PLAN Tanner Gallagher 54 y.o. male with no significant medical history presents for evaluation of bilateral lymphadenopathy.  Review the labs, review the records, discussion with the patient the findings are most consistent with bilateral lymphadenopathy with possible involvement of abscesses.  The patient does have a strong history of abscesses including an 8 cm perianal abscess in 2019 as well as numerous skin lesions on the back previously thought to be superficial skin infections.  Given this I am concerned the patient may have localized abscesses with reactive lymphadenopathy in that area.  In order to help clarify this I would like to order a CT abdomen pelvis in order to better visualize the lymphadenopathy and determine if there are any abscesses present causing the local inflammation.  In either event if this is simply lymphadenopathy or in the event that these are abscesses will require surgical evaluation for either excisional biopsy or drainage of abscesses.  As such we will make a referral to surgery today and obtain CT imaging of the abdomen and pelvis.  We will plan to see the patient back after he has been evaluated by the surgical service, hopefully after he has obtained imaging.  # Bilateral Inguinal Lymphadenopathy -- findings are consistent with a bilateral inguinal lymphadenopathy. Prior STI testing was negative in July 2021. Additionally he has a history remarkable for abscesses.  --recommend CT Abdomen/pelvis in order to better assess his lymphadenopathy and determine if he has abscesses as well.    --recommend referral to surgery in either case (for excisional  lymph node biopsy vs abscess drainage).  --will order CBC, CMP, LDH, flow cytometry, and inflammatory markers.  --RTC following surgery consult and CT imaging.    Orders Placed This Encounter  Procedures  . CT Abdomen Pelvis W Contrast    Standing Status:   Future    Standing Expiration Date:   10/11/2020    Order Specific Question:   If indicated for the ordered procedure, I authorize the administration of contrast media per Radiology protocol    Answer:   Yes    Order Specific Question:   Preferred imaging location?    Answer:   [facility]    Order Specific Question:   Is Oral Contrast requested for this exam?    Answer:   Yes, Per Radiology protocol    Order Specific Question:   Radiology Contrast Protocol - do NOT remove file path    Answer:   [email or web address]  . CBC with Differential (Cancer Center Only)    Standing Status:   Future    Number of Occurrences:   1    Standing Expiration Date:   10/11/2020  . Save Smear (SSMR)    Standing Status:   Future    Number of Occurrences:   1    Standing Expiration Date:   10/11/2020  . CMP ([facility] only)    Standing Status:   Future    Number of Occurrences:   1    Standing Expiration Date:   10/11/2020  . Lactate dehydrogenase (LDH)    Standing Status:   Future    Number of Occurrences:   1    Standing Expiration Date:   10/11/2020  . Flow Cytometry    Standing Status:   Future    Number of Occurrences:   1    Standing Expiration Date:   10/11/2020  . Sedimentation rate    Standing Status:   Future    Number of Occurrences:   1    Standing Expiration Date:   10/11/2020  . C-reactive protein    Standing Status:   Future    Number of Occurrences:   1    Standing Expiration Date:   10/11/2020  . Ambulatory referral to General Surgery    Referral Priority:   Urgent    Referral Type:   Surgical    Referral Reason:    Specialty Services Required    Requested Specialty:   General Surgery    Number of Visits Requested:   1    All questions were answered. The patient knows to call the clinic with any problems, questions or concerns.  A total of more than 60 minutes were spent on this encounter and over half of that time was spent on counseling and coordination of care as outlined above.   Ledell Peoples, MD Department of Hematology/Oncology [place] [facility] Phone: [phone] Pager: [phone] Email: [email or web address]  10/12/2019 2:49 PM

## 2019-10-12 ENCOUNTER — Inpatient Hospital Stay: Payer: Self-pay | Attending: Hematology and Oncology | Admitting: Hematology and Oncology

## 2019-10-12 ENCOUNTER — Other Ambulatory Visit: Payer: Self-pay

## 2019-10-12 ENCOUNTER — Encounter: Payer: Self-pay | Admitting: Hematology and Oncology

## 2019-10-12 ENCOUNTER — Inpatient Hospital Stay: Payer: Self-pay

## 2019-10-12 VITALS — BP 99/63 | HR 62 | Temp 97.0°F | Resp 18 | Ht 69.0 in | Wt 142.5 lb

## 2019-10-12 DIAGNOSIS — R590 Localized enlarged lymph nodes: Secondary | ICD-10-CM

## 2019-10-12 DIAGNOSIS — Z87891 Personal history of nicotine dependence: Secondary | ICD-10-CM | POA: Insufficient documentation

## 2019-10-12 DIAGNOSIS — R599 Enlarged lymph nodes, unspecified: Secondary | ICD-10-CM

## 2019-10-12 LAB — CMP (CANCER CENTER ONLY)
ALT: 16 U/L (ref 0–44)
AST: 19 U/L (ref 15–41)
Albumin: 3.7 g/dL (ref 3.5–5.0)
Alkaline Phosphatase: 62 U/L (ref 38–126)
Anion gap: 3 — ABNORMAL LOW (ref 5–15)
BUN: 14 mg/dL (ref 6–20)
CO2: 28 mmol/L (ref 22–32)
Calcium: 9.3 mg/dL (ref 8.9–10.3)
Chloride: 106 mmol/L (ref 98–111)
Creatinine: 0.95 mg/dL (ref 0.61–1.24)
GFR, Est AFR Am: >60 mL/min (ref >60)
GFR, Estimated: >60 mL/min (ref >60)
Glucose, Bld: 75 mg/dL (ref 70–99)
Potassium: 4 mmol/L (ref 3.5–5.1)
Sodium: 137 mmol/L (ref 135–145)
Total Bilirubin: 0.3 mg/dL (ref 0.3–1.2)
Total Protein: 7.4 g/dL (ref 6.5–8.1)

## 2019-10-12 LAB — CBC WITH DIFFERENTIAL (CANCER CENTER ONLY)
Abs Immature Granulocytes: 0 10*3/uL (ref 0.00–0.07)
Basophils Absolute: 0.1 10*3/uL (ref 0.0–0.1)
Basophils Relative: 1 %
Eosinophils Absolute: 0.3 10*3/uL (ref 0.0–0.5)
Eosinophils Relative: 6 %
HCT: 34.5 % — ABNORMAL LOW (ref 39.0–52.0)
Hemoglobin: 11.6 g/dL — ABNORMAL LOW (ref 13.0–17.0)
Immature Granulocytes: 0 %
Lymphocytes Relative: 57 %
Lymphs Abs: 3 10*3/uL (ref 0.7–4.0)
MCH: 31.3 pg (ref 26.0–34.0)
MCHC: 33.6 g/dL (ref 30.0–36.0)
MCV: 93 fL (ref 80.0–100.0)
Monocytes Absolute: 0.5 10*3/uL (ref 0.1–1.0)
Monocytes Relative: 9 %
Neutro Abs: 1.4 10*3/uL — ABNORMAL LOW (ref 1.7–7.7)
Neutrophils Relative %: 27 %
Platelet Count: 275 10*3/uL (ref 150–400)
RBC: 3.71 MIL/uL — ABNORMAL LOW (ref 4.22–5.81)
RDW: 14.6 % (ref 11.5–15.5)
WBC Count: 5.2 10*3/uL (ref 4.0–10.5)
nRBC: 0 % (ref 0.0–0.2)

## 2019-10-12 LAB — SEDIMENTATION RATE: Sed Rate: 12 mm/hr (ref 0–16)

## 2019-10-12 LAB — LACTATE DEHYDROGENASE: LDH: 202 U/L — ABNORMAL HIGH (ref 98–192)

## 2019-10-12 LAB — SAVE SMEAR(SSMR), FOR PROVIDER SLIDE REVIEW

## 2019-10-12 LAB — C-REACTIVE PROTEIN: CRP: 0.7 mg/dL (ref <1.0)

## 2019-10-13 ENCOUNTER — Telehealth: Payer: Self-pay | Admitting: Hematology and Oncology

## 2019-10-13 LAB — SURGICAL PATHOLOGY

## 2019-10-13 NOTE — Telephone Encounter (Signed)
Scheduled per los. Called and spoke with patient. Confirmed appt 

## 2019-10-14 LAB — FLOW CYTOMETRY

## 2019-10-21 ENCOUNTER — Other Ambulatory Visit: Payer: Self-pay

## 2019-10-21 ENCOUNTER — Ambulatory Visit (HOSPITAL_COMMUNITY)
Admission: RE | Admit: 2019-10-21 | Discharge: 2019-10-21 | Disposition: A | Discharge status: Home / Self Care | Payer: Self-pay | Source: Ambulatory Visit | Attending: Hematology and Oncology | Admitting: Hematology and Oncology

## 2019-10-21 DIAGNOSIS — R599 Enlarged lymph nodes, unspecified: Secondary | ICD-10-CM | POA: Insufficient documentation

## 2019-10-21 MED ORDER — IOHEXOL 300 MG/ML  SOLN
100.0000 mL | Freq: Once | INTRAMUSCULAR | Status: AC | PRN
  Administered 2019-10-21: 100 mL via INTRAVENOUS

## 2019-11-21 ENCOUNTER — Telehealth: Payer: Self-pay | Admitting: Hematology and Oncology

## 2019-11-21 NOTE — Telephone Encounter (Signed)
R/s 11/3  appt per provider schedule. Called and spoke with patient. Confirmed new date and time

## 2019-11-23 ENCOUNTER — Inpatient Hospital Stay: Payer: Self-pay

## 2019-11-23 ENCOUNTER — Inpatient Hospital Stay: Payer: Self-pay | Admitting: Hematology and Oncology

## 2019-12-09 ENCOUNTER — Other Ambulatory Visit: Payer: Self-pay

## 2019-12-09 ENCOUNTER — Inpatient Hospital Stay: Payer: Self-pay | Attending: Hematology and Oncology

## 2019-12-09 ENCOUNTER — Encounter: Payer: Self-pay | Admitting: Hematology and Oncology

## 2019-12-09 ENCOUNTER — Other Ambulatory Visit: Payer: Self-pay | Admitting: Hematology and Oncology

## 2019-12-09 ENCOUNTER — Inpatient Hospital Stay (HOSPITAL_BASED_OUTPATIENT_CLINIC_OR_DEPARTMENT_OTHER): Payer: Self-pay | Admitting: Hematology and Oncology

## 2019-12-09 VITALS — BP 114/65 | HR 62 | Temp 98.1°F | Resp 20 | Ht 69.0 in | Wt 138.8 lb

## 2019-12-09 DIAGNOSIS — R599 Enlarged lymph nodes, unspecified: Secondary | ICD-10-CM

## 2019-12-09 DIAGNOSIS — R590 Localized enlarged lymph nodes: Secondary | ICD-10-CM | POA: Insufficient documentation

## 2019-12-09 DIAGNOSIS — Z87891 Personal history of nicotine dependence: Secondary | ICD-10-CM | POA: Insufficient documentation

## 2019-12-09 LAB — CMP (CANCER CENTER ONLY)
ALT: 14 U/L (ref 0–44)
AST: 17 U/L (ref 15–41)
Albumin: 3.8 g/dL (ref 3.5–5.0)
Alkaline Phosphatase: 59 U/L (ref 38–126)
Anion gap: 7 (ref 5–15)
BUN: 22 mg/dL — ABNORMAL HIGH (ref 6–20)
CO2: 29 mmol/L (ref 22–32)
Calcium: 9.2 mg/dL (ref 8.9–10.3)
Chloride: 107 mmol/L (ref 98–111)
Creatinine: 1.13 mg/dL (ref 0.61–1.24)
GFR, Estimated: >60 mL/min (ref >60)
Glucose, Bld: 87 mg/dL (ref 70–99)
Potassium: 4.2 mmol/L (ref 3.5–5.1)
Sodium: 143 mmol/L (ref 135–145)
Total Bilirubin: 0.4 mg/dL (ref 0.3–1.2)
Total Protein: 7.5 g/dL (ref 6.5–8.1)

## 2019-12-09 LAB — CBC WITH DIFFERENTIAL (CANCER CENTER ONLY)
Abs Immature Granulocytes: 0 10*3/uL (ref 0.00–0.07)
Basophils Absolute: 0 10*3/uL (ref 0.0–0.1)
Basophils Relative: 1 %
Eosinophils Absolute: 0.3 10*3/uL (ref 0.0–0.5)
Eosinophils Relative: 5 %
HCT: 38 % — ABNORMAL LOW (ref 39.0–52.0)
Hemoglobin: 12.7 g/dL — ABNORMAL LOW (ref 13.0–17.0)
Immature Granulocytes: 0 %
Lymphocytes Relative: 49 %
Lymphs Abs: 2.2 10*3/uL (ref 0.7–4.0)
MCH: 30.5 pg (ref 26.0–34.0)
MCHC: 33.4 g/dL (ref 30.0–36.0)
MCV: 91.3 fL (ref 80.0–100.0)
Monocytes Absolute: 0.5 10*3/uL (ref 0.1–1.0)
Monocytes Relative: 10 %
Neutro Abs: 1.6 10*3/uL — ABNORMAL LOW (ref 1.7–7.7)
Neutrophils Relative %: 35 %
Platelet Count: 228 10*3/uL (ref 150–400)
RBC: 4.16 MIL/uL — ABNORMAL LOW (ref 4.22–5.81)
RDW: 14.4 % (ref 11.5–15.5)
WBC Count: 4.6 10*3/uL (ref 4.0–10.5)
nRBC: 0 % (ref 0.0–0.2)

## 2019-12-09 LAB — LACTATE DEHYDROGENASE: LDH: 187 U/L (ref 98–192)

## 2019-12-09 NOTE — Progress Notes (Signed)
[facility] [phone]   [phone]  PROGRESS NOTE  Patient Care Team: Kerin Perna, NP as PCP - General (Internal Medicine)  Hematological/Oncological History #Persistent Bilateral Inguinal Lymphadenopathy 1) 07/25/2019: presented to the ED with ongoing left inguinal lymphadenopathy. Previously seen in June 2021 and given course of doxycycline. Infectious workup including RPR, HIV, GC/chlamydia were negative.  2) 09/23/2019: presented to PCP with concern for bilaterally enlarged lymph nodes of the groin. Noted to have scant drainage 3) 10/12/2019: establish care with Dr. Lorenso Courier   Interval History:  Lynnell Chad 54 y.o. male with medical history significant for bilateral inguinal lymphadenopathy who presents for a follow up visit. The patient's last visit was on 10/12/2019. In the interim since the last visit the patient has not been able to meet with surgery (due to bad debt balance), but is now scheduled for 12/20/2019. He had an interval CT scan which showed lymphadenopathy limited to the inguinal region.  On exam today Mrs. Carbajal notes he has been well in the interim since her last visit.  He notes that there has been no growth of the inguinal lymph nodes and has not noticed any new lymph nodes.  He also notes that there has been no rupture or leakage of these lymph nodes like prior.  He is also had no fevers, chills, sweats and his weight has been relatively stable, though he notes he is trying to gain weight and he is dropped about 4 pounds since he was last seen.  He also continues to 9 urinary symptoms such as straining, urgency, or burning.  A full 10 point ROS is listed below.  MEDICAL HISTORY:  Past Medical History:  Diagnosis Date  . Medical history non-contributory     SURGICAL HISTORY: Past Surgical History:  Procedure Laterality Date  . INCISION AND DRAINAGE PERIRECTAL ABSCESS  06/16/2017  . INCISION AND DRAINAGE PERIRECTAL ABSCESS N/A  06/16/2017   Procedure: IRRIGATION AND DEBRIDEMENT PERIRECTAL ABSCESS;  Surgeon: Coralie Keens, MD;  Location: [facility];  Service: General;  Laterality: N/A;  . INGUINAL HERNIA REPAIR Right 2000s  . PROCTOSCOPY  06/16/2017   Procedure: RIGID PROCTOSCOPY;  Surgeon: Coralie Keens, MD;  Location: [facility] OR;  Service: General;;    SOCIAL HISTORY: Social History   Socioeconomic History  . Marital status: Married    Spouse name: Not on file  . Number of children: Not on file  . Years of education: Not on file  . Highest education level: Not on file  Occupational History  . Not on file  Tobacco Use  . Smoking status: Former Smoker    Packs/day: 0.12    Years: 37.00    Pack years: 4.44    Types: Cigarettes    Quit date: 05/20/2017    Years since quitting: 2.5  . Smokeless tobacco: Never Used  Vaping Use  . Vaping Use: Never used  Substance and Sexual Activity  . Alcohol use: Not Currently  . Drug use: Yes    Types: Marijuana    Comment: 06/16/2017 "weekly"  . Sexual activity: Yes  Other Topics Concern  . Not on file  Social History Narrative  . Not on file   Social Determinants of Health   Financial Resource Strain:   . Difficulty of Paying Living Expenses: Not on file  Food Insecurity:   . Worried About Charity fundraiser in the Last Year: Not on file  . Ran Out of Food in the Last Year: Not on file  Transportation Needs:   . Film/video editor (Medical): Not on file  . Lack of Transportation (Non-Medical): Not on file  Physical Activity:   . Days of Exercise per Week: Not on file  . Minutes of Exercise per Session: Not on file  Stress:   . Feeling of Stress : Not on file  Social Connections:   . Frequency of Communication with Friends and Family: Not on file  . Frequency of Social Gatherings with Friends and Family: Not on file  . Attends Religious Services: Not on file  . Active Member of Clubs or Organizations: Not on file  . Attends Archivist  Meetings: Not on file  . Marital Status: Not on file  Intimate Partner Violence:   . Fear of Current or Ex-Partner: Not on file  . Emotionally Abused: Not on file  . Physically Abused: Not on file  . Sexually Abused: Not on file    FAMILY HISTORY: Family History  Problem Relation Age of Onset  . Diabetes Neg Hx     ALLERGIES:  has No Known Allergies.  MEDICATIONS:  No current outpatient medications on file.   No current facility-administered medications for this visit.    REVIEW OF SYSTEMS:   Constitutional: ( - ) fevers, ( - )  chills , ( - ) night sweats Eyes: ( - ) blurriness of vision, ( - ) double vision, ( - ) watery eyes Ears, nose, mouth, throat, and face: ( - ) mucositis, ( - ) sore throat Respiratory: ( - ) cough, ( - ) dyspnea, ( - ) wheezes Cardiovascular: ( - ) palpitation, ( - ) chest discomfort, ( - ) lower extremity swelling Gastrointestinal:  ( - ) nausea, ( - ) heartburn, ( - ) change in bowel habits Skin: ( - ) abnormal skin rashes Lymphatics: ( - ) new lymphadenopathy, ( - ) easy bruising Neurological: ( - ) numbness, ( - ) tingling, ( - ) new weaknesses Behavioral/Psych: ( - ) mood change, ( - ) new changes  All other systems were reviewed with the patient and are negative.  PHYSICAL EXAMINATION: ECOG PERFORMANCE STATUS: 0 - Asymptomatic  Vitals:   12/09/19 1523  BP: 114/65  Pulse: 62  Resp: 20  Temp: 98.1 F (36.7 C)  SpO2: 100%   Filed Weights   12/09/19 1523  Weight: 138 lb 12.8 oz (63 kg)    GENERAL: well appearing middle aged [place] American male. alert, no distress and comfortable SKIN: skin color, texture, turgor are normal, no rashes or significant lesions EYES: conjunctiva are pink and non-injected, sclera clear LYMPH:  Palpable inguinal lymphadenopathy in groin bilaterally LUNGS: clear to auscultation and percussion with normal breathing effort HEART: regular rate & rhythm and no murmurs and no lower extremity  edema Musculoskeletal: no cyanosis of digits and no clubbing  PSYCH: alert & oriented x 3, fluent speech NEURO: no focal motor/sensory deficits  LABORATORY DATA:  I have reviewed the data as listed CBC Latest Ref Rng & Units 12/09/2019 10/12/2019 07/25/2019  WBC 4.0 - 10.5 K/uL 4.6 5.2 4.6  Hemoglobin 13.0 - 17.0 g/dL 12.7(L) 11.6(L) 12.2(L)  Hematocrit 39 - 52 % 38.0(L) 34.5(L) 37.6(L)  Platelets 150 - 400 K/uL 228 275 283    CMP Latest Ref Rng & Units 12/09/2019 10/12/2019 07/02/2019  Glucose 70 - 99 mg/dL 87 75 104(H)  BUN 6 - 20 mg/dL 22(H) 14 12  Creatinine 0.61 - 1.24 mg/dL 1.13 0.95 0.88  Sodium 135 -  145 mmol/L 143 137 137  Potassium 3.5 - 5.1 mmol/L 4.2 4.0 4.6  Chloride 98 - 111 mmol/L 107 106 106  CO2 22 - 32 mmol/L 29 28 25   Calcium 8.9 - 10.3 mg/dL 9.2 9.3 8.9  Total Protein 6.5 - 8.1 g/dL 7.5 7.4 6.9  Total Bilirubin 0.3 - 1.2 mg/dL 0.4 0.3 0.7  Alkaline Phos 38 - 126 U/L 59 62 48  AST 15 - 41 U/L 17 19 16   ALT 0 - 44 U/L 14 16 18     RADIOGRAPHIC STUDIES: No results found.  ASSESSMENT & PLAN Monte Zinni 55 y.o. male with no significant medical history presents for evaluation of bilateral lymphadenopathy.  Review the labs, review the records, discussion with the patient the findings are most consistent with persistent bilateral lymphadenopathy. The patient does have a strong history of abscesses including an 8 cm perianal abscess in 2019 as well as numerous skin lesions on the back previously thought to be superficial skin infections.  We orderd a CT abdomen pelvis to better visualize the lymphadenopathy and it r/o any abscesses present causing the local inflammation. These lymph nodes will require surgical evaluation for consideration of excisional biopsy.  We will plan to see the patient back after he has been evaluated by the surgical service, hopefully after a biopsy has been obtained.   # Bilateral Inguinal Lymphadenopathy -- findings are consistent with a bilateral  inguinal lymphadenopathy. Prior STI testing was negative in July 2021. Additionally he has a history remarkable for abscesses.  -- CT Abdomen/pelvis performed to better assess his lymphadenopathy. No evidence of abscesses or lymph nodes elsewhere in the body.  -- referral to surgery, scheduled for 12/20/2019. Requesting they consider excisional biopsy of one of these lesions.  --will order CBC, CMP, LDH today. Prior workup showed no abnormal findings on flow cytometry or inflammatory workup.  --RTC following surgery consult and biopsy  No orders of the defined types were placed in this encounter.   All questions were answered. The patient knows to call the clinic with any problems, questions or concerns.  A total of more than 25 minutes were spent on this encounter and over half of that time was spent on counseling and coordination of care as outlined above.   Ledell Peoples, MD Department of Hematology/Oncology [place] at [facility] Phone: [phone] Pager: [phone] Email: [email or web address]  12/09/2019 4:10 PM

## 2019-12-13 ENCOUNTER — Telehealth: Payer: Self-pay | Admitting: Hematology and Oncology

## 2019-12-13 NOTE — Telephone Encounter (Signed)
Scheduled per 11/19 los. Called and spoke with pt, confirmed 1/19 appt

## 2019-12-20 ENCOUNTER — Ambulatory Visit: Payer: Self-pay | Admitting: General Surgery

## 2019-12-20 NOTE — H&P (Signed)
   The patient is a 54 year old male who presents with lymphadenopathy. Patient being followed by hematology oncology for lymphadenopathy which has been present for several months. Infectious workup has been negative. He is here today for evaluation for lymph node biopsy.   Past Surgical History Darden Palmer, [place]; 12/20/2019 9:10 AM) Anal Fissure Repair Open Inguinal Hernia Surgery Right.  Diagnostic Studies History Darden Palmer, [place]; 12/20/2019 9:10 AM) Colonoscopy never  Allergies Darden Palmer, RMA; 12/20/2019 9:12 AM) No Known Drug Allergies [06/25/2017]: Allergies Reconciled  Medication History Darden Palmer, [place]; 12/20/2019 9:12 AM) No Current Medications Medications Reconciled  Social History Darden Palmer, [place]; 12/20/2019 9:10 AM) Alcohol use Occasional alcohol use, Recently quit alcohol use. Caffeine use Carbonated beverages, Tea. Illicit drug use Recently quit drug use, Uses daily. Tobacco use Former smoker.  Family History Darden Palmer, [place]; 12/20/2019 9:10 AM) First Degree Relatives No pertinent family history  Other Problems Darden Palmer, [place]; 12/20/2019 9:10 AM) Alcohol Abuse Back Pain Inguinal Hernia     Review of Systems Darden Palmer RMA; 12/20/2019 9:10 AM) General Present- Weight Loss. Not Present- Appetite Loss, Chills, Fatigue, Fever, Night Sweats and Weight Gain. Skin Present- Dryness. Not Present- Change in Wart/Mole, Hives, Jaundice, New Lesions, Non-Healing Wounds, Rash and Ulcer. HEENT Not Present- Earache, Hearing Loss, Hoarseness, Nose Bleed, Oral Ulcers, Ringing in the Ears, Seasonal Allergies, Sinus Pain, Sore Throat, Visual Disturbances, Wears glasses/contact lenses and Yellow Eyes. Respiratory Not Present- Bloody sputum, Chronic Cough, Difficulty Breathing, Snoring and Wheezing. Cardiovascular Present- Leg Cramps. Not Present- Chest Pain, Difficulty Breathing Lying Down, Palpitations, Rapid Heart Rate, Shortness  of Breath and Swelling of Extremities. Gastrointestinal Present- Excessive gas. Not Present- Abdominal Pain, Bloating, Bloody Stool, Change in Bowel Habits, Chronic diarrhea, Constipation, Difficulty Swallowing, Gets full quickly at meals, Hemorrhoids, Indigestion, Nausea, Rectal Pain and Vomiting. Male Genitourinary Present- Change in Urinary Stream and Painful Urination. Not Present- Blood in Urine, Frequency, Impotence, Nocturia, Urgency and Urine Leakage.  Vitals Lattie Haw [place] RMA; 12/20/2019 9:12 AM) 12/20/2019 9:12 AM Weight: 140.38 lb Height: 69in Body Surface Area: 1.78 m Body Mass Index: 20.73 kg/m  Temp.: 97.12F  Pulse: 78 (Regular)  P.OX: 100% (Room air) BP: 118/66(Sitting, Left Arm, Standard)        Physical Exam Leighton Ruff MD; 35/32/9924 9:41 AM)  General Mental Status-Alert. General Appearance-Cooperative.  Lymphatic Femoral & Inguinal  Inguinal nodes: Left: Size: Note: Large fixed mass approximately 5 cm in diameter. Right - Size - Note: Multiple mobile nodules noted. Approximately 3 centimeters in diameter.    Assessment & Plan Leighton Ruff MD; 26/83/4196 9:24 AM)  INGUINAL LYMPHADENOPATHY (R59.0) Impression: 54 year old male who presents to the office for evaluation of chronic bilateral inguinal lymphadenopathy. This has been present for at least 6 months and infectious workup has been negative. He saw him on and a lymph node biopsy was recommended. Patient has palpable lymph nodes noted in both groins. We discussed lymph node biopsy in detail. Risks such as bleeding, seroma, and damage to adjacent structures were discussed. All questions were answered. We will proceed with this as soon as possible.

## 2020-01-28 ENCOUNTER — Ambulatory Visit: Payer: Self-pay | Admitting: General Surgery

## 2020-01-30 ENCOUNTER — Other Ambulatory Visit (HOSPITAL_COMMUNITY)
Admission: RE | Admit: 2020-01-30 | Discharge: 2020-01-30 | Disposition: A | Discharge status: Home / Self Care | Payer: Self-pay | Source: Ambulatory Visit | Attending: Surgery | Admitting: Surgery

## 2020-01-30 DIAGNOSIS — Z20822 Contact with and (suspected) exposure to covid-19: Secondary | ICD-10-CM | POA: Insufficient documentation

## 2020-01-30 DIAGNOSIS — Z01812 Encounter for preprocedural laboratory examination: Secondary | ICD-10-CM | POA: Insufficient documentation

## 2020-01-30 LAB — SARS CORONAVIRUS 2 (TAT 6-24 HRS): SARS Coronavirus 2: NEGATIVE

## 2020-02-01 ENCOUNTER — Other Ambulatory Visit: Payer: Self-pay

## 2020-02-01 ENCOUNTER — Encounter (HOSPITAL_BASED_OUTPATIENT_CLINIC_OR_DEPARTMENT_OTHER): Payer: Self-pay | Admitting: Surgery

## 2020-02-01 NOTE — Progress Notes (Signed)
Spoke with michelle at [facility] triage and made aware patient  Is still scheduled for dr Bobbye Morton and it is dr Marcello Moores case for 02-02-2020, note sent to dr Marcello Moores epic ib.

## 2020-02-01 NOTE — Progress Notes (Signed)
Spoke w/ via phone for pre-op interview---pt Lab needs dos----  none             Lab results------none COVID test ------01-30-2020 845 Arrive at -------1330 pm NPO after MN NO Solid Food.  Clear liquids from MN until---1230 pm then npo Medications to take morning of surgery -----none Diabetic medication -----n/a Patient Special Instructions -----none Pre-Op special Istructions -----none Patient verbalized understanding of instructions that were given at this phone interview. Patient denies shortness of breath, chest pain, fever, cough at this phone interview.

## 2020-02-01 NOTE — Progress Notes (Signed)
Called to tell patient about time change for 02/02/20 to be here at 0930

## 2020-02-02 ENCOUNTER — Ambulatory Visit (HOSPITAL_COMMUNITY)
Admission: RE | Admit: 2020-02-02 | Discharge: 2020-02-02 | Disposition: A | Discharge status: Home / Self Care | Payer: Self-pay | Attending: General Surgery | Admitting: General Surgery

## 2020-02-02 ENCOUNTER — Other Ambulatory Visit: Payer: Self-pay

## 2020-02-02 ENCOUNTER — Ambulatory Visit (HOSPITAL_BASED_OUTPATIENT_CLINIC_OR_DEPARTMENT_OTHER): Payer: Self-pay | Admitting: Anesthesiology

## 2020-02-02 ENCOUNTER — Encounter (HOSPITAL_BASED_OUTPATIENT_CLINIC_OR_DEPARTMENT_OTHER)
Admission: RE | Disposition: A | Discharge status: Home / Self Care | Payer: Self-pay | Source: Home / Self Care | Attending: General Surgery

## 2020-02-02 ENCOUNTER — Encounter (HOSPITAL_BASED_OUTPATIENT_CLINIC_OR_DEPARTMENT_OTHER): Payer: Self-pay | Admitting: General Surgery

## 2020-02-02 DIAGNOSIS — C8445 Peripheral T-cell lymphoma, not classified, lymph nodes of inguinal region and lower limb: Secondary | ICD-10-CM | POA: Insufficient documentation

## 2020-02-02 DIAGNOSIS — Z87891 Personal history of nicotine dependence: Secondary | ICD-10-CM | POA: Insufficient documentation

## 2020-02-02 HISTORY — DX: Other specified disorders of teeth and supporting structures: K08.89

## 2020-02-02 HISTORY — DX: Other chronic pain: G89.29

## 2020-02-02 HISTORY — PX: LYMPH NODE BIOPSY: SHX201

## 2020-02-02 HISTORY — DX: Low back pain, unspecified: M54.50

## 2020-02-02 SURGERY — LYMPH NODE BIOPSY
Anesthesia: Monitor Anesthesia Care | Site: Groin | Laterality: Right

## 2020-02-02 MED ORDER — ACETAMINOPHEN 500 MG PO TABS: 1000.0000 mg | ORAL_TABLET | ORAL | Status: DC

## 2020-02-02 MED ORDER — LACTATED RINGERS IV SOLN: INTRAVENOUS | Status: DC

## 2020-02-02 MED ORDER — OXYCODONE HCL 5 MG/5ML PO SOLN
5.0000 mg | Freq: Once | ORAL | Status: DC | PRN
Start: 2020-02-02 — End: 2020-02-02

## 2020-02-02 MED ORDER — ACETAMINOPHEN 500 MG PO TABS
1000.0000 mg | ORAL_TABLET | Freq: Once | ORAL | Status: AC
  Administered 2020-02-02: 1000 mg via ORAL

## 2020-02-02 MED ORDER — CEFAZOLIN SODIUM-DEXTROSE 2-4 GM/100ML-% IV SOLN
2.0000 g | INTRAVENOUS | Status: AC
  Administered 2020-02-02: 2 g via INTRAVENOUS

## 2020-02-02 MED ORDER — ONDANSETRON HCL 4 MG/2ML IJ SOLN: 4.0000 mg | Freq: Once | INTRAMUSCULAR | Status: DC | PRN

## 2020-02-02 MED ORDER — GABAPENTIN 300 MG PO CAPS
ORAL_CAPSULE | ORAL | Status: AC
  Filled 2020-02-02: qty 1

## 2020-02-02 MED ORDER — SODIUM CHLORIDE 0.9% FLUSH: 3.0000 mL | Freq: Two times a day (BID) | INTRAVENOUS | Status: DC

## 2020-02-02 MED ORDER — FENTANYL CITRATE (PF) 100 MCG/2ML IJ SOLN: 25.0000 ug | INTRAMUSCULAR | Status: DC | PRN

## 2020-02-02 MED ORDER — ACETAMINOPHEN 325 MG RE SUPP: 650.0000 mg | RECTAL | Status: DC | PRN

## 2020-02-02 MED ORDER — MIDAZOLAM HCL 2 MG/2ML IJ SOLN
INTRAMUSCULAR | Status: AC
  Filled 2020-02-02: qty 2

## 2020-02-02 MED ORDER — PROPOFOL 10 MG/ML IV BOLUS
INTRAVENOUS | Status: DC | PRN
  Administered 2020-02-02: 30 mg via INTRAVENOUS

## 2020-02-02 MED ORDER — FENTANYL CITRATE (PF) 100 MCG/2ML IJ SOLN
INTRAMUSCULAR | Status: AC
  Filled 2020-02-02: qty 2

## 2020-02-02 MED ORDER — PROPOFOL 500 MG/50ML IV EMUL
INTRAVENOUS | Status: DC | PRN
  Administered 2020-02-02: 150 ug/kg/min via INTRAVENOUS

## 2020-02-02 MED ORDER — ACETAMINOPHEN 500 MG PO TABS
ORAL_TABLET | ORAL | Status: AC
  Filled 2020-02-02: qty 2

## 2020-02-02 MED ORDER — ACETAMINOPHEN 500 MG PO TABS: 1000.0000 mg | ORAL_TABLET | Freq: Once | ORAL | Status: DC

## 2020-02-02 MED ORDER — CELECOXIB 200 MG PO CAPS
ORAL_CAPSULE | ORAL | Status: AC
  Filled 2020-02-02: qty 1

## 2020-02-02 MED ORDER — SODIUM CHLORIDE 0.9% FLUSH: 3.0000 mL | INTRAVENOUS | Status: DC | PRN

## 2020-02-02 MED ORDER — ONDANSETRON HCL 4 MG/2ML IJ SOLN
INTRAMUSCULAR | Status: DC | PRN
  Administered 2020-02-02: 4 mg via INTRAVENOUS

## 2020-02-02 MED ORDER — CELECOXIB 200 MG PO CAPS
200.0000 mg | ORAL_CAPSULE | Freq: Once | ORAL | Status: AC
  Administered 2020-02-02: 200 mg via ORAL

## 2020-02-02 MED ORDER — ACETAMINOPHEN 325 MG PO TABS: 650.0000 mg | ORAL_TABLET | ORAL | Status: DC | PRN

## 2020-02-02 MED ORDER — KETOROLAC TROMETHAMINE 15 MG/ML IJ SOLN: 15.0000 mg | Freq: Once | INTRAMUSCULAR | Status: DC | PRN

## 2020-02-02 MED ORDER — PROPOFOL 500 MG/50ML IV EMUL
INTRAVENOUS | Status: AC
  Filled 2020-02-02: qty 50

## 2020-02-02 MED ORDER — GABAPENTIN 300 MG PO CAPS
300.0000 mg | ORAL_CAPSULE | Freq: Once | ORAL | Status: AC
  Administered 2020-02-02: 300 mg via ORAL

## 2020-02-02 MED ORDER — FENTANYL CITRATE (PF) 100 MCG/2ML IJ SOLN
INTRAMUSCULAR | Status: DC | PRN
  Administered 2020-02-02: 50 ug via INTRAVENOUS

## 2020-02-02 MED ORDER — OXYCODONE HCL 5 MG PO TABS: 5.0000 mg | ORAL_TABLET | ORAL | Status: DC | PRN

## 2020-02-02 MED ORDER — SODIUM CHLORIDE 0.9 % IV SOLN: 250.0000 mL | INTRAVENOUS | Status: DC | PRN

## 2020-02-02 MED ORDER — 0.9 % SODIUM CHLORIDE (POUR BTL) OPTIME
TOPICAL | Status: DC | PRN
  Administered 2020-02-02: 500 mL

## 2020-02-02 MED ORDER — BUPIVACAINE HCL 0.5 % IJ SOLN
INTRAMUSCULAR | Status: DC | PRN
  Administered 2020-02-02: 15 mL

## 2020-02-02 MED ORDER — CEFAZOLIN SODIUM-DEXTROSE 2-4 GM/100ML-% IV SOLN
INTRAVENOUS | Status: AC
  Filled 2020-02-02: qty 100

## 2020-02-02 MED ORDER — OXYCODONE HCL 5 MG PO TABS: 5.0000 mg | ORAL_TABLET | Freq: Once | ORAL | Status: DC | PRN

## 2020-02-02 MED ORDER — MIDAZOLAM HCL 5 MG/5ML IJ SOLN
INTRAMUSCULAR | Status: DC | PRN
  Administered 2020-02-02: 2 mg via INTRAVENOUS

## 2020-02-02 SURGICAL SUPPLY — 48 items
ADH SKN CLS APL DERMABOND .7 (GAUZE/BANDAGES/DRESSINGS) ×1
APL PRP STRL LF DISP 70% ISPRP (MISCELLANEOUS) ×1
APL SWBSTK 6 STRL LF DISP (MISCELLANEOUS) ×1
APPLICATOR COTTON TIP 6 STRL (MISCELLANEOUS) ×1 IMPLANT
APPLICATOR COTTON TIP 6IN STRL (MISCELLANEOUS) ×2
BLADE HEX COATED 2.75 (ELECTRODE) ×2 IMPLANT
BLADE SURG 15 STRL LF DISP TIS (BLADE) ×1 IMPLANT
BLADE SURG 15 STRL SS (BLADE) ×2
CANISTER SUCT 3000ML PPV (MISCELLANEOUS) ×2 IMPLANT
CHLORAPREP W/TINT 26 (MISCELLANEOUS) ×2 IMPLANT
CNTNR URN SCR LID CUP LEK RST (MISCELLANEOUS) IMPLANT
CONT SPEC 4OZ STRL OR WHT (MISCELLANEOUS) ×2
COVER BACK TABLE 60X90IN (DRAPES) ×2 IMPLANT
COVER MAYO STAND STRL (DRAPES) ×2 IMPLANT
COVER WAND RF STERILE (DRAPES) ×2 IMPLANT
DECANTER SPIKE VIAL GLASS SM (MISCELLANEOUS) ×2 IMPLANT
DERMABOND ADVANCED (GAUZE/BANDAGES/DRESSINGS) ×1
DERMABOND ADVANCED .7 DNX12 (GAUZE/BANDAGES/DRESSINGS) ×1 IMPLANT
DRAPE LAPAROTOMY TRNSV 102X78 (DRAPES) ×2 IMPLANT
DRAPE UTILITY XL STRL (DRAPES) ×2 IMPLANT
ELECT REM PT RETURN 9FT ADLT (ELECTROSURGICAL) ×2
ELECTRODE REM PT RTRN 9FT ADLT (ELECTROSURGICAL) ×1 IMPLANT
GAUZE SPONGE 4X4 12PLY STRL (GAUZE/BANDAGES/DRESSINGS) ×2 IMPLANT
GLOVE BIO SURGEON STRL SZ7 (GLOVE) ×1 IMPLANT
GLOVE BIO SURGEON STRL SZ7.5 (GLOVE) ×1 IMPLANT
GLOVE BIOGEL PI IND STRL 7.0 (GLOVE) IMPLANT
GLOVE BIOGEL PI INDICATOR 7.0 (GLOVE) ×2
GLOVE SURG ENC MOIS LTX SZ6.5 (GLOVE) ×4 IMPLANT
GLOVE SURG UNDER POLY LF SZ7 (GLOVE) ×4 IMPLANT
GOWN STRL REUS W/ TWL LRG LVL3 (GOWN DISPOSABLE) ×1 IMPLANT
GOWN STRL REUS W/TWL LRG LVL3 (GOWN DISPOSABLE) ×6
KIT TURNOVER CYSTO (KITS) ×2 IMPLANT
NEEDLE HYPO 22GX1.5 SAFETY (NEEDLE) ×2 IMPLANT
NS IRRIG 500ML POUR BTL (IV SOLUTION) ×2 IMPLANT
PACK BASIN DAY SURGERY FS (CUSTOM PROCEDURE TRAY) ×2 IMPLANT
PENCIL SMOKE EVACUATOR (MISCELLANEOUS) ×2 IMPLANT
SUT SILK 2 0 (SUTURE) ×2
SUT SILK 2 0 SH (SUTURE) ×2 IMPLANT
SUT SILK 2-0 18XBRD TIE 12 (SUTURE) IMPLANT
SUT VIC AB 2-0 SH 27 (SUTURE) ×4
SUT VIC AB 2-0 SH 27XBRD (SUTURE) IMPLANT
SUT VIC AB 4-0 PS2 27 (SUTURE) ×1 IMPLANT
SUT VIC AB 4-0 SH 27 (SUTURE) ×2
SUT VIC AB 4-0 SH 27XANBCTRL (SUTURE) IMPLANT
SYR BULB IRRIG 60ML STRL (SYRINGE) ×2 IMPLANT
SYR CONTROL 10ML LL (SYRINGE) ×2 IMPLANT
TOWEL OR 17X26 10 PK STRL BLUE (TOWEL DISPOSABLE) ×4 IMPLANT
WATER STERILE IRR 500ML POUR (IV SOLUTION) ×2 IMPLANT

## 2020-02-02 NOTE — Op Note (Signed)
02/02/2020  12:36 PM  PATIENT:  Tanner Gallagher  55 y.o. male  Patient Care Team: Kerin Perna, NP as PCP - General (Internal Medicine)  PRE-OPERATIVE DIAGNOSIS:  INGUINAL LYPHADENOPATHY  POST-OPERATIVE DIAGNOSIS:  INGUINAL LYPHADENOPATHY  PROCEDURE:   EXCISIONAL BIOPSY RIGHT INGUINAL LYMPH NODE    Surgeon(s): Leighton Ruff, MD  ASSISTANT: none   ANESTHESIA:   local and MAC  EBL: 74m  Total I/O In: 100 [IV Piggyback:100] Out: -   DRAINS: none   SPECIMEN:  Source of Specimen:  R inguinal lymph node  DISPOSITION OF SPECIMEN:  PATHOLOGY  COUNTS:  YES  PLAN OF CARE: Discharge to home after PACU  PATIENT DISPOSITION:  PACU - hemodynamically stable.  INDICATION: 55y.o. M with inguinal lymphadenopathy of unknown etiology.  Open biopsy was requested   OR FINDINGS: bilateral lymphadenopathy with matting on the left  DESCRIPTION: the patient was identified in the preoperative holding area and taken to the OR where they were laid supine on the operating room table.  MAC anesthesia was induced without difficulty. SCDs were also noted to be in place prior to the initiation of anesthesia.  The patient was then prepped and draped in the usual sterile fashion.   A surgical timeout was performed indicating the correct patient, procedure, positioning and need for preoperative antibiotics.   I began by performing a field block around the presumed right inguinal incision site.  This was with half percent Marcaine plain.  I then made a small incision in the right inguinal skin over the palpated lymph node.  I dissected down through the subcutaneous tissues and opened up the fascia.  The lymph node was identified and lifted upwards using a 2-0 Vicryl suture.  I then dissected around the lymph node using blunt dissection and electrocautery.  I tied off the lymph channel using a 2-0 silk suture.  The specimen was sent to pathology fresh for further evaluation.  Hemostasis was good.  The  fascia was reapproximated using a 2-0 Vicryl suture.  The skin was closed using a 4-0 Vicryl suture and Dermabond.  Patient tolerated the procedure and was sent to the postanesthesia care unit in stable condition.  All counts were correct per operating room staff.

## 2020-02-02 NOTE — Transfer of Care (Signed)
Immediate Anesthesia Transfer of Care Note  Patient: Tanner Gallagher  Procedure(s) Performed: Procedure(s) (LRB): EXCISIONAL INGUINAL LYMPH NODE BIOPSY (N/A)  Patient Location: PACU  Anesthesia Type: MAC  Level of Consciousness: awake, sedated, patient cooperative and responds to stimulation  Airway & Oxygen Therapy: Patient Spontanous Breathing on RA with soft FM   Post-op Assessment: Report given to PACU RN, Post -op Vital signs reviewed and stable and Patient moving all extremities  Post vital signs: Reviewed and stable  Complications: No apparent anesthesia complications

## 2020-02-02 NOTE — H&P (Signed)
  The patient is a 55 year old male who presents with lymphadenopathy. Patient being followed by hematology oncology for lymphadenopathy which has been present for several months. Infectious workup has been negative. He is here today for evaluation for lymph node biopsy.   Past Surgical History Darden Palmer, [place]; 12/20/2019 9:10 AM) Anal Fissure Repair Open Inguinal Hernia Surgery Right.  Diagnostic Studies History Darden Palmer, [place]; 12/20/2019 9:10 AM) Colonoscopy never  Allergies Darden Palmer, RMA; 12/20/2019 9:12 AM) No Known Drug Allergies [06/25/2017]: Allergies Reconciled  Medication History Darden Palmer, [place]; 12/20/2019 9:12 AM) No Current Medications Medications Reconciled  Social History Darden Palmer, [place]; 12/20/2019 9:10 AM) Alcohol use Occasional alcohol use, Recently quit alcohol use. Caffeine use Carbonated beverages, Tea. Illicit drug use Recently quit drug use, Uses daily. Tobacco use Former smoker.  Family History Darden Palmer, [place]; 12/20/2019 9:10 AM) First Degree Relatives No pertinent family history  Other Problems Darden Palmer, RMA; 12/20/2019 9:10 AM) Alcohol Abuse Back Pain Inguinal Hernia     Review of Systems  General Present- Weight Loss. Not Present- Appetite Loss, Chills, Fatigue, Fever, Night Sweats and Weight Gain. Skin Present- Dryness. Not Present- Change in Wart/Mole, Hives, Jaundice, New Lesions, Non-Healing Wounds, Rash and Ulcer. HEENT Not Present- Earache, Hearing Loss, Hoarseness, Nose Bleed, Oral Ulcers, Ringing in the Ears, Seasonal Allergies, Sinus Pain, Sore Throat, Visual Disturbances, Wears glasses/contact lenses and Yellow Eyes. Respiratory Not Present- Bloody sputum, Chronic Cough, Difficulty Breathing, Snoring and Wheezing. Cardiovascular Present- Leg Cramps. Not Present- Chest Pain, Difficulty Breathing Lying Down, Palpitations, Rapid Heart Rate, Shortness of Breath and Swelling of  Extremities. Gastrointestinal Present- Excessive gas. Not Present- Abdominal Pain, Bloating, Bloody Stool, Change in Bowel Habits, Chronic diarrhea, Constipation, Difficulty Swallowing, Gets full quickly at meals, Hemorrhoids, Indigestion, Nausea, Rectal Pain and Vomiting. Male Genitourinary Present- Change in Urinary Stream and Painful Urination. Not Present- Blood in Urine, Frequency, Impotence, Nocturia, Urgency and Urine Leakage.   BP 116/78   Pulse 74   Temp (!) 96.6 F (35.9 C) (Oral)   Resp (!) 97   Ht 5\' 8"  (1.727 m)   Wt 63.4 kg   SpO2 100%   BMI 21.24 kg/m    Physical Exam  General Mental Status-Alert. General Appearance-Cooperative.  Lymphatic Femoral & Inguinal  Inguinal nodes: Left: Size: Note: Large fixed mass approximately 5 cm in diameter. Right - Size - Note: Multiple mobile nodules noted. Approximately 3 centimeters in diameter.    Assessment & Plan   INGUINAL LYMPHADENOPATHY (R59.0) Impression: 55 year old male who presents to the office for evaluation of chronic bilateral inguinal lymphadenopathy. This has been present for at least 6 months and infectious workup has been negative. He saw him on and a lymph node biopsy was recommended. Patient has palpable lymph nodes noted in both groins. We discussed lymph node biopsy in detail. Risks such as bleeding, seroma, and damage to adjacent structures were discussed. All questions were answered. We will proceed with this as soon as possible.

## 2020-02-02 NOTE — Discharge Instructions (Addendum)
GENERAL SURGERY: POST OP INSTRUCTIONS  1. DIET: Follow a light bland diet the first 24 hours after arrival home, such as soup, liquids, crackers, etc.  Be sure to include lots of fluids daily.  Avoid fast food or heavy meals as your are more likely to get nauseated.   2. Take your usually prescribed home medications unless otherwise directed. 3. PAIN CONTROL: a. Pain is best controlled by a usual combination of three different methods TOGETHER: i. Ice/Heat ii. Over the counter pain medication: It is helpful to take an over-the-counter pain medication regularly for the first few weeks.  Choose one of the following that works best for you: Naproxen (Aleve, etc)  Two 220mg  tabs twice a day Ibuprofen (Advil, etc) Three 200mg  tabs four times a day (every meal & bedtime) and Tylenol 1000mg  every 8 hours  4. Avoid getting constipated.  Between the surgery and the pain medications, it is common to experience some constipation.  Increasing fluid intake and taking a fiber supplement (such as Metamucil, Citrucel, FiberCon, MiraLax, etc) 1-2 times a day regularly will usually help prevent this problem from occurring.  A mild laxative (prune juice, Milk of Magnesia, MiraLax, etc) should be taken according to package directions if there are no bowel movements after 48 hours.   5. Wash / shower every day.  You may shower over the dressings as they are waterproof.  Continue to shower over incision(s) after the dressing is off. 6. You have Dermabond on your incision.  This is a sterile glue that will wear off in approximately 1 week.  You may place a dressing/Band-Aid to cover the incision for comfort if you wish.   7. ACTIVITIES as tolerated:   a. You may resume regular (light) daily activities beginning the next day--such as daily self-care, walking, climbing stairs--gradually increasing activities as tolerated.  Avoid heavy lifting for 48 hours after surgery.  If you can walk 30 minutes without difficulty, it is  safe to try more intense activity such as jogging, treadmill, bicycling, low-impact aerobics, swimming, etc. b. Save the most intensive and strenuous activity for last such as sit-ups, heavy lifting, contact sports, etc  Refrain from any heavy lifting or straining until you are off narcotics for pain control.   c. DO NOT PUSH THROUGH PAIN.  Let pain be your guide: If it hurts to do something, don't do it.  Pain is your body warning you to avoid that activity for another week until the pain goes down. d. You may drive when you are no longer taking prescription pain medication, you can comfortably wear a seatbelt, and you can safely maneuver your car and apply brakes. e. Tanner Gallagher may have sexual intercourse when it is comfortable.  8. FOLLOW UP in our office a. Please call [facility] at [phone] to set up an appointment to see your surgeon in the office for a follow-up appointment approximately 2-3 weeks after your surgery. b. Make sure that you call for this appointment the day you arrive home to insure a convenient appointment time. 9. IF YOU HAVE DISABILITY OR FAMILY LEAVE FORMS, BRING THEM TO THE OFFICE FOR PROCESSING.  DO NOT GIVE THEM TO YOUR DOCTOR.   WHEN TO CALL us [phone]: 1. Poor pain control 2. Reactions / problems with new medications (rash/itching, nausea, etc)  3. Fever over 101.5 F (38.5 C) 4. Worsening swelling or bruising 5. Continued bleeding from incision. 6. Increased pain, redness, or drainage from the incision   The clinic staff  is available to answer your questions during regular business hours (8:30am-5pm).  Please don't hesitate to call and ask to speak to one of our nurses for clinical concerns.   If you have a medical emergency, go to the nearest emergency room or call 911.  A surgeon from [facility] is always on call at the [facility], [place], [place], [place], [place]  [address] ? MAIN: [phone] ?  TOLL FREE: [phone] ?  FAX [phone] [email or web address]   Post Anesthesia Home Care Instructions  Activity: Get plenty of rest for the remainder of the day. A responsible individual must stay with you for 24 hours following the procedure.  For the next 24 hours, DO NOT: -Drive a car -Paediatric nurse -Drink alcoholic beverages -Take any medication unless instructed by your physician -Make any legal decisions or sign important papers.  Meals: Start with liquid foods such as gelatin or soup. Progress to regular foods as tolerated. Avoid greasy, spicy, heavy foods. If nausea and/or vomiting occur, drink only clear liquids until the nausea and/or vomiting subsides. Call your physician if vomiting continues.  Special Instructions/Symptoms: Your throat may feel dry or sore from the anesthesia or the breathing tube placed in your throat during surgery. If this causes discomfort, gargle with warm salt water. The discomfort should disappear within 24 hours.

## 2020-02-02 NOTE — Anesthesia Preprocedure Evaluation (Addendum)
Anesthesia Evaluation  Patient identified by MRN, date of birth, ID band Patient awake    Reviewed: Allergy & Precautions, NPO status , Patient's Chart, lab work & pertinent test results  Airway Mallampati: I  TM Distance: >3 FB Neck ROM: Full    Dental  (+) Chipped, Dental Advisory Given,    Pulmonary Patient abstained from smoking., former smoker,  Quit smoking 2019, 5 pack year history    Pulmonary exam normal breath sounds clear to auscultation       Cardiovascular negative cardio ROS Normal cardiovascular exam Rhythm:Regular Rate:Normal     Neuro/Psych negative neurological ROS  negative psych ROS   GI/Hepatic negative GI ROS, (+)     substance abuse  marijuana use,   Endo/Other  negative endocrine ROS  Renal/GU negative Renal ROS  negative genitourinary   Musculoskeletal Chronic LBP   Abdominal   Peds  Hematology negative hematology ROS (+)   Anesthesia Other Findings Inguinal lymphadenopathy   Reproductive/Obstetrics negative OB ROS                           Anesthesia Physical Anesthesia Plan  ASA: II  Anesthesia Plan: MAC   Post-op Pain Management:    Induction:   PONV Risk Score and Plan: 2 and Propofol infusion and TIVA  Airway Management Planned: Natural Airway and Simple Face Mask  Additional Equipment: None  Intra-op Plan:   Post-operative Plan:   Informed Consent: I have reviewed the patients History and Physical, chart, labs and discussed the procedure including the risks, benefits and alternatives for the proposed anesthesia with the patient or authorized representative who has indicated his/her understanding and acceptance.     Dental advisory given  Plan Discussed with: CRNA  Anesthesia Plan Comments:        Anesthesia Quick Evaluation

## 2020-02-02 NOTE — Anesthesia Postprocedure Evaluation (Signed)
Anesthesia Post Note  Patient: Tanner Gallagher  Procedure(s) Performed: EXCISIONAL INGUINAL LYMPH NODE BIOPSY (Right Groin)     Patient location during evaluation: PACU Anesthesia Type: MAC Level of consciousness: awake and alert Pain management: pain level controlled Vital Signs Assessment: post-procedure vital signs reviewed and stable Respiratory status: spontaneous breathing, nonlabored ventilation and respiratory function stable Cardiovascular status: blood pressure returned to baseline and stable Postop Assessment: no apparent nausea or vomiting Anesthetic complications: no   No complications documented.  Last Vitals:  Vitals:   02/02/20 1248 02/02/20 1300  BP: 131/71   Pulse: 78 64  Resp: (!) 25 15  Temp:  (!) 36.3 C  SpO2: 100% 100%    Last Pain:  Vitals:   02/02/20 0941  TempSrc: Oral  PainSc: 0-No pain                 Pervis Hocking

## 2020-02-03 ENCOUNTER — Encounter (HOSPITAL_BASED_OUTPATIENT_CLINIC_OR_DEPARTMENT_OTHER): Payer: Self-pay | Admitting: General Surgery

## 2020-02-08 ENCOUNTER — Encounter: Payer: Self-pay | Admitting: Hematology and Oncology

## 2020-02-08 ENCOUNTER — Other Ambulatory Visit: Payer: Self-pay | Admitting: Hematology and Oncology

## 2020-02-08 ENCOUNTER — Inpatient Hospital Stay: Payer: Self-pay | Attending: Hematology and Oncology | Admitting: Hematology and Oncology

## 2020-02-08 ENCOUNTER — Other Ambulatory Visit: Payer: Self-pay

## 2020-02-08 ENCOUNTER — Inpatient Hospital Stay: Payer: Self-pay

## 2020-02-08 DIAGNOSIS — R590 Localized enlarged lymph nodes: Secondary | ICD-10-CM

## 2020-02-08 DIAGNOSIS — Z87891 Personal history of nicotine dependence: Secondary | ICD-10-CM | POA: Insufficient documentation

## 2020-02-08 DIAGNOSIS — C865 Angioimmunoblastic T-cell lymphoma: Secondary | ICD-10-CM | POA: Insufficient documentation

## 2020-02-08 DIAGNOSIS — C8460 Anaplastic large cell lymphoma, ALK-positive, unspecified site: Secondary | ICD-10-CM | POA: Insufficient documentation

## 2020-02-08 LAB — CBC WITH DIFFERENTIAL (CANCER CENTER ONLY)
Abs Immature Granulocytes: 0.01 K/uL (ref 0.00–0.07)
Basophils Absolute: 0 K/uL (ref 0.0–0.1)
Basophils Relative: 1 %
Eosinophils Absolute: 0.2 K/uL (ref 0.0–0.5)
Eosinophils Relative: 5 %
HCT: 37.5 % — ABNORMAL LOW (ref 39.0–52.0)
Hemoglobin: 12.7 g/dL — ABNORMAL LOW (ref 13.0–17.0)
Immature Granulocytes: 0 %
Lymphocytes Relative: 47 %
Lymphs Abs: 2.1 K/uL (ref 0.7–4.0)
MCH: 31 pg (ref 26.0–34.0)
MCHC: 33.9 g/dL (ref 30.0–36.0)
MCV: 91.5 fL (ref 80.0–100.0)
Monocytes Absolute: 0.5 K/uL (ref 0.1–1.0)
Monocytes Relative: 11 %
Neutro Abs: 1.6 K/uL — ABNORMAL LOW (ref 1.7–7.7)
Neutrophils Relative %: 36 %
Platelet Count: 239 K/uL (ref 150–400)
RBC: 4.1 MIL/uL — ABNORMAL LOW (ref 4.22–5.81)
RDW: 15.6 % — ABNORMAL HIGH (ref 11.5–15.5)
WBC Count: 4.5 K/uL (ref 4.0–10.5)
nRBC: 0 % (ref 0.0–0.2)

## 2020-02-08 LAB — CMP (CANCER CENTER ONLY)
ALT: 17 U/L (ref 0–44)
AST: 16 U/L (ref 15–41)
Albumin: 3.7 g/dL (ref 3.5–5.0)
Alkaline Phosphatase: 58 U/L (ref 38–126)
Anion gap: 9 (ref 5–15)
BUN: 15 mg/dL (ref 6–20)
CO2: 25 mmol/L (ref 22–32)
Calcium: 9.6 mg/dL (ref 8.9–10.3)
Chloride: 106 mmol/L (ref 98–111)
Creatinine: 0.98 mg/dL (ref 0.61–1.24)
GFR, Estimated: >60 mL/min (ref >60)
Glucose, Bld: 106 mg/dL — ABNORMAL HIGH (ref 70–99)
Potassium: 4.4 mmol/L (ref 3.5–5.1)
Sodium: 140 mmol/L (ref 135–145)
Total Bilirubin: 0.5 mg/dL (ref 0.3–1.2)
Total Protein: 7.5 g/dL (ref 6.5–8.1)

## 2020-02-08 LAB — SURGICAL PATHOLOGY

## 2020-02-08 LAB — SEDIMENTATION RATE: Sed Rate: 10 mm/hr (ref 0–16)

## 2020-02-08 LAB — LACTATE DEHYDROGENASE: LDH: 169 U/L (ref 98–192)

## 2020-02-08 NOTE — Progress Notes (Signed)
START ON PATHWAY REGIMEN - Lymphoma and CLL     A cycle is every 21 days:     Cyclophosphamide      Doxorubicin      Prednisone      Brentuximab vedotin   **Always confirm dose/schedule in your pharmacy ordering system**  Patient Characteristics: T-Cell Lymphoma (Systemic), First Line, AITL (Angioimmunoblastic T-Cell Lymphoma) or ALCL (Anaplastic Large Cell Lymphoma) or Peripheral T-Cell, NOS, CD30 Positive Disease Type: Not Applicable Disease Type: T-Cell Lymphoma (Systemic) Disease Type: Not Applicable Line of Therapy: First Line T-Cell Lymphoma Subtype: ALCL (Anaplastic Large Cell Lymphoma) CD30 Status: CD30 Positive Intent of Therapy: Curative Intent, Discussed with Patient 

## 2020-02-08 NOTE — Progress Notes (Signed)
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 Previously seen in June 2021 and given course of doxycycline. Infectious workup including RPR, HIV, GC/chlamydia were negative.  2) 09/23/2019: presented to PCP with concern for bilaterally enlarged lymph nodes of the groin. Noted to have scant drainage 3) 10/12/2019: establish care with Dr. 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   Interval History:  Tanner Gallagher 55 y.o. male with medical history significant for Anaplastic T Cell Lymphoma who presents for a follow up visit. The patient's last visit was on 12/09/2019. In the interim since the last visit the patient has met with surgery for an excisional lymph node biopsy which showed Anaplastic T Cell Lymphoma.   On exam today Tanner Gallagher notes that he feels well.  He reports that the procedure went well and he was not having any issues with pain or healing postoperatively.  He notes that he believes the lymph nodes on the left side of his groin have increased in size since our prior visit.  He also notes that some of the skin rashes that he mention during his previous visit on his back have subsequently resolved.  He has not been having any issues with fevers, chills, sweats, nausea, vomiting or diarrhea.  He has not felt any new lymphadenopathy.  His energy and appetite are both good and his weight is stable.  He has there been no other major changes in his health.  A full 10 point ROS is listed below.  The bulk of our discussion focused on the diagnosis of anaplastic large cell lymphoma.  MEDICAL HISTORY:  Past Medical History:  Diagnosis Date   . Chronic lower back pain 15 years  . Medical history non-contributory   . Tooth pain    no abscess or drainage per pt 02-01-2020    SURGICAL HISTORY: Past Surgical History:  Procedure Laterality Date  . INCISION AND DRAINAGE PERIRECTAL ABSCESS  06/16/2017  . INCISION AND DRAINAGE PERIRECTAL ABSCESS N/A 06/16/2017   Procedure: IRRIGATION AND DEBRIDEMENT PERIRECTAL ABSCESS;  Surgeon: Coralie Keens, MD;  Location: [facility];  Service: General;  Laterality: N/A;  . INGUINAL HERNIA REPAIR Right 2000s  . LYMPH NODE BIOPSY Right 02/02/2020   Procedure: EXCISIONAL INGUINAL LYMPH NODE BIOPSY;  Surgeon: Leighton Ruff, MD;  Location: [place];  Service: General;  Laterality: Right;  . PROCTOSCOPY  06/16/2017   Procedure: RIGID PROCTOSCOPY;  Surgeon: Coralie Keens, MD;  Location: [facility];  Service: General;;    SOCIAL HISTORY: Social History   Socioeconomic History  . Marital status: Married    Spouse name: Not on file  . Number of children: Not on file  . Years of education: Not on file  . Highest education level: Not on file  Occupational History  . Not on file  Tobacco Use  . Smoking status: Former Smoker    Packs/day: 0.12    Years: 37.00    Pack years: 4.44    Types: Cigarettes    Quit date: 05/20/2017    Years since quitting: 2.7  . Smokeless tobacco: Never Used  Vaping Use  . Vaping Use: Never used  Substance and Sexual Activity  . Alcohol  use: Not Currently    Comment: rarew  . Drug use: Yes    Types: Marijuana    Comment: 02-01-2020 "once a day"  . Sexual activity: Yes  Other Topics Concern  . Not on file  Social History Narrative  . Not on file   Social Determinants of Health   Financial Resource Strain: Not on file  Food Insecurity: Not on file  Transportation Needs: Not on file  Physical Activity: Not on file  Stress: Not on file  Social Connections: Not on file  Intimate Partner Violence: Not on file    FAMILY HISTORY: Family History   Problem Relation Age of Onset  . Diabetes Neg Hx     ALLERGIES:  has No Known Allergies.  MEDICATIONS:  Current Outpatient Medications  Medication Sig Dispense Refill  . acetaminophen (TYLENOL) 500 MG tablet Take 1,000 mg by mouth every 6 (six) hours as needed.    Marland Kitchen ibuprofen (ADVIL) 200 MG tablet Take 200 mg by mouth every 6 (six) hours as needed. Prn for tooth pain     No current facility-administered medications for this visit.    REVIEW OF SYSTEMS:   Constitutional: ( - ) fevers, ( - )  chills , ( - ) night sweats Eyes: ( - ) blurriness of vision, ( - ) double vision, ( - ) watery eyes Ears, nose, mouth, throat, and face: ( - ) mucositis, ( - ) sore throat Respiratory: ( - ) cough, ( - ) dyspnea, ( - ) wheezes Cardiovascular: ( - ) palpitation, ( - ) chest discomfort, ( - ) lower extremity swelling Gastrointestinal:  ( - ) nausea, ( - ) heartburn, ( - ) change in bowel habits Skin: ( - ) abnormal skin rashes Lymphatics: ( - ) new lymphadenopathy, ( - ) easy bruising Neurological: ( - ) numbness, ( - ) tingling, ( - ) new weaknesses Behavioral/Psych: ( - ) mood change, ( - ) new changes  All other systems were reviewed with the patient and are negative.  PHYSICAL EXAMINATION: ECOG PERFORMANCE STATUS: 0 - Asymptomatic  Vitals:   02/08/20 1542  BP: 121/72  Pulse: 60  Resp: 13  Temp: (!) 95.5 F (35.3 C)  SpO2: 100%   Filed Weights   02/08/20 1542  Weight: 140 lb 14.4 oz (63.9 kg)    GENERAL: well appearing middle aged [place] American male. alert, no distress and comfortable SKIN: skin color, texture, turgor are normal, no rashes or significant lesions EYES: conjunctiva are pink and non-injected, sclera clear LYMPH:  Deferred today.  LUNGS: clear to auscultation and percussion with normal breathing effort HEART: regular rate & rhythm and no murmurs and no lower extremity edema Musculoskeletal: no cyanosis of digits and no clubbing  PSYCH: alert & oriented x 3,  fluent speech NEURO: no focal motor/sensory deficits  LABORATORY DATA:  I have reviewed the data as listed CBC Latest Ref Rng & Units 02/08/2020 12/09/2019 10/12/2019  WBC 4.0 - 10.5 K/uL 4.5 4.6 5.2  Hemoglobin 13.0 - 17.0 g/dL 12.7(L) 12.7(L) 11.6(L)  Hematocrit 39.0 - 52.0 % 37.5(L) 38.0(L) 34.5(L)  Platelets 150 - 400 K/uL 239 228 275    CMP Latest Ref Rng & Units 02/08/2020 12/09/2019 10/12/2019  Glucose 70 - 99 mg/dL 106(H) 87 75  BUN 6 - 20 mg/dL 15 22(H) 14  Creatinine 0.61 - 1.24 mg/dL 0.98 1.13 0.95  Sodium 135 - 145 mmol/L 140 143 137  Potassium 3.5 - 5.1 mmol/L 4.4 4.2 4.0  Chloride 98 - 111 mmol/L 106 107 106  CO2 22 - 32 mmol/L 25 29 28   Calcium 8.9 - 10.3 mg/dL 9.6 9.2 9.3  Total Protein 6.5 - 8.1 g/dL 7.5 7.5 7.4  Total Bilirubin 0.3 - 1.2 mg/dL 0.5 0.4 0.3  Alkaline Phos 38 - 126 U/L 58 59 62  AST 15 - 41 U/L 16 17 19   ALT 0 - 44 U/L 17 14 16     RADIOGRAPHIC STUDIES: No results found.  ASSESSMENT & PLAN Tanner Gallagher 55 y.o. male with no significant medical history presents for evaluation of Anaplastic T Cell Lymphoma.  Review the labs, review the records, discussion with the patient the findings are most consistent with Anaplastic T Cell Lymphoma.    Given these findings will need to prepare the patient for chemotherapy.  We have not been able to adequately stage the tumor as we only have abdominal imaging at this time and therefore we will acquire a PET CT scan in order to confirm the stage.  Additionally he will require a port and echocardiogram.  We will have the scheduled as soon as is feasible.  Our goal will be to start chemotherapy on 02/24/2020 as the patient noted later in the week would probably best for the start.  The treatment of choice for this patient's cancer would be B-CHP chemotherapy.  This regimen consists of doxorubicin 50 mg per metered squared on day 1, cyclophosphamide 750 mg per metered squared on day 1, and brentuximab 1.8 mg/kg on day 1 of a  21-day cycle.  The patient also received prednisone 100mg  PO on days 1-5.  We will proceed for 6 cycles.  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 Prior STI testing was negative in July 2021. Additionally he has a history remarkable for abscesses.  -- excisional biopsy now shows an anaplastic large cell lymphoma.  --patient will require a PET CT scan, TTE, and port placement --plan for B-CHP chemotherapy as noted above. --RTC on 02/24/2020 for Cycle 1 Day 1 of treatment and a clinic visit.   #Supportive Care --chemotherapy education to be scheduled  --zofran 8mg  q8H PRN and compazine 10mg  PO q6H for nausea -- allopurinol 300mg  PO daily for TLS prophylaxis -- EMLA cream for port -- no pain medication required at this time.    No orders of the defined types were placed in this encounter.   All questions were answered. The patient knows to call the clinic with any problems, questions or concerns.  A total of more than 30 minutes were spent on this encounter and over half of that time was spent on counseling and coordination of care as outlined above.   Ledell Peoples, MD Department of Hematology/Oncology [place] at [facility] Phone: [phone] Pager: [phone] Email: [email or web address]  02/08/2020 3:54 PM   Literature Support:  Lynden Ang OA, Pro B, [place], [place], [place], [place], [place], Morschhauser F, Domingo-Domenech E, Margot Chimes [address] D, [place] , [place], [place], Vesta SP, Shustov A, Httmann A, [place], Yuen S, Iyer S, Zinzani PL, Hua Z, Little M, Romelle Starcher T, Trmper L; [place] Study Group. 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 Lancet. 2019 Jan 19;393(10168):229-240.    --Median progression-free survival was 482 months (95% CI 352-not  evaluable) in the A+CHP group and 208 months (127-476) in the CHOP group (hazard ratio 071 [95% CI 054-093], p=[id number]).   --Front-line treatment with A+CHP is superior to CHOP for patients with CD30-positive peripheral T-cell lymphomas as shown by a significant improvement in progression-free survival and overall survival with a manageable safety profile.

## 2020-02-10 ENCOUNTER — Telehealth: Payer: Self-pay | Admitting: Hematology and Oncology

## 2020-02-10 NOTE — Telephone Encounter (Signed)
Scheduled per 11/9 los. Called pt and left a msg °

## 2020-02-10 NOTE — Progress Notes (Signed)
The following biosimilar Udenyca ([id number]) has been selected for use in this patient. Pt assistance pending.  Kennith Center, Pharm.D., CPP 02/10/2020@4 :35 PM

## 2020-02-14 ENCOUNTER — Inpatient Hospital Stay: Payer: Self-pay

## 2020-02-14 ENCOUNTER — Other Ambulatory Visit: Payer: Self-pay | Admitting: Radiology

## 2020-02-14 ENCOUNTER — Other Ambulatory Visit: Payer: Self-pay

## 2020-02-14 NOTE — H&P (Signed)
Chief Complaint: Patient was seen in consultation today for port-a-catheter placement.   Referring Physician(s): Jaci Standard  Supervising Physician: Ruel Favors  Patient Status: [facility] - Out-pt  History of Present Illness: Tanner Gallagher is a 55 y.o. male with a medical history significant for lymphoma. He was noted to have persistent bilateral inguinal lymphadenopathy July 2021 and he underwent a lymph node biopsy with surgery 02/02/20. 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   Interventional Radiology has been asked to evaluate this patient for an image-guided port-a-catheter placement to facilitate his treatment goals.   Past Medical History:  Diagnosis Date  . Cancer (HCC)    lymph nodes  . Chronic lower back pain 15 years  . Medical history non-contributory   . Tooth pain    no abscess or drainage per pt 02-01-2020    Past Surgical History:  Procedure Laterality Date  . INCISION AND DRAINAGE PERIRECTAL ABSCESS  06/16/2017  . INCISION AND DRAINAGE PERIRECTAL ABSCESS N/A 06/16/2017   Procedure: IRRIGATION AND DEBRIDEMENT PERIRECTAL ABSCESS;  Surgeon: Abigail Miyamoto, MD;  Location: [facility] OR;  Service: General;  Laterality: N/A;  . INGUINAL HERNIA REPAIR Right 2000s  . LYMPH NODE BIOPSY Right 02/02/2020   Procedure: EXCISIONAL INGUINAL LYMPH NODE BIOPSY;  Surgeon: Romie Levee, MD;  Location: [facility];  Service: General;  Laterality: Right;  . PROCTOSCOPY  06/16/2017   Procedure: RIGID PROCTOSCOPY;  Surgeon: Abigail Miyamoto, MD;  Location: [facility] OR;  Service: General;;    Allergies: Patient has no known allergies.  Medications: Prior to Admission medications   Medication Sig Start Date End Date Taking? Authorizing Provider  acetaminophen (TYLENOL) 500 MG tablet Take 1,000 mg by mouth every 6 (six) hours as needed.    [provider]  ibuprofen (ADVIL) 200 MG tablet Take 200 mg by  mouth every 6 (six) hours as needed. Prn for tooth pain    [provider]     Family History  Problem Relation Age of Onset  . Diabetes Neg Hx     Social History   Socioeconomic History  . Marital status: Married    Spouse name: Not on file  . Number of children: Not on file  . Years of education: Not on file  . Highest education level: Not on file  Occupational History  . Not on file  Tobacco Use  . Smoking status: Former Smoker    Packs/day: 0.12    Years: 37.00    Pack years: 4.44    Types: Cigarettes    Quit date: 05/20/2017    Years since quitting: 2.7  . Smokeless tobacco: Never Used  Vaping Use  . Vaping Use: Never used  Substance and Sexual Activity  . Alcohol use: Not Currently    Comment: rarew  . Drug use: Yes    Types: Marijuana    Comment: 02-01-2020 "once a day"  . Sexual activity: Yes  Other Topics Concern  . Not on file  Social History Narrative  . Not on file   Social Determinants of Health   Financial Resource Strain: Not on file  Food Insecurity: Not on file  Transportation Needs: Not on file  Physical Activity: Not on file  Stress: Not on file  Social Connections: Not on file    Review of Systems: A 12 point ROS discussed and pertinent positives are indicated in the HPI above.  All other systems are negative.  Review of Systems  Constitutional: Negative for appetite change and fatigue.  Respiratory: Negative for cough and shortness of breath.   Cardiovascular: Negative for chest pain and leg swelling.  Gastrointestinal: Negative for abdominal pain, diarrhea, nausea and vomiting.  Musculoskeletal: Negative for back pain.  Neurological: Negative for dizziness and headaches.  Hematological: Positive for adenopathy.    Vital Signs: BP 120/80 (BP Location: Right Arm)   Pulse 73   Temp 97.8 F (36.6 C) (Oral)   Resp 18   Ht 5\' 8"  (1.727 m)   Wt 140 lb (63.5 kg)   SpO2 100%   BMI 21.29 kg/m   Physical Exam Constitutional:       General: He is not in acute distress. HENT:     Mouth/Throat:     Mouth: Mucous membranes are moist.     Pharynx: Oropharynx is clear.  Cardiovascular:     Rate and Rhythm: Normal rate and regular rhythm.     Pulses: Normal pulses.     Heart sounds: Normal heart sounds.  Pulmonary:     Effort: Pulmonary effort is normal.     Breath sounds: Normal breath sounds.  Abdominal:     General: Bowel sounds are normal.     Palpations: Abdomen is soft.  Musculoskeletal:        General: Normal range of motion.  Lymphadenopathy:     Lower Body: Right inguinal adenopathy present. Left inguinal adenopathy present.     Comments: Right inguinal biopsy site covered with skin glue.   Skin:    General: Skin is warm and dry.  Neurological:     Mental Status: He is alert and oriented to person, place, and time.  Psychiatric:        Mood and Affect: Mood normal.        Behavior: Behavior normal.        Thought Content: Thought content normal.        Judgment: Judgment normal.     Imaging: No results found.  Labs:  CBC: Recent Labs    07/25/19 1122 10/12/19 1432 12/09/19 1453 02/08/20 1505  WBC 4.6 5.2 4.6 4.5  HGB 12.2* 11.6* 12.7* 12.7*  HCT 37.6* 34.5* 38.0* 37.5*  PLT 283 275 228 239    COAGS: No results for input(s): INR, APTT in the last 8760 hours.  BMP: Recent Labs    07/02/19 0907 10/12/19 1432 12/09/19 1453 02/08/20 1505  NA 137 137 143 140  K 4.6 4.0 4.2 4.4  CL 106 106 107 106  CO2 25 28 29 25   GLUCOSE 104* 75 87 106*  BUN 12 14 22* 15  CALCIUM 8.9 9.3 9.2 9.6  CREATININE 0.88 0.95 1.13 0.98  GFRNONAA >60 >60 >60 >60  GFRAA >60 >60  --   --     LIVER FUNCTION TESTS: Recent Labs    07/02/19 0907 10/12/19 1432 12/09/19 1453 02/08/20 1505  BILITOT 0.7 0.3 0.4 0.5  AST 16 19 17 16   ALT 18 16 14 17   ALKPHOS 48 62 59 58  PROT 6.9 7.4 7.5 7.5  ALBUMIN 3.4* 3.7 3.8 3.7    TUMOR MARKERS: No results for input(s): AFPTM, CEA, CA199, CHROMGRNA in  the last 8760 hours.  Assessment and Plan:  T-Cell Lymphoma; pending chemotherapy: Tanner Gallagher, 55 year old male, presents today to the [facility] Interventional Radiology department for an image-guided port-a-catheter placement.   Risks and benefits of image-guided port-a-catheter placement were discussed with the patient including, but not limited to bleeding, infection, pneumothorax, or  fibrin sheath development and need for additional procedures.  All of the patient's questions were answered, patient is agreeable to proceed.  Consent signed and in chart.  Thank you for this interesting consult.  I greatly enjoyed meeting Tanner Gallagher and look forward to participating in their care.  A copy of this report was sent to the requesting provider on this date.  Electronically Signed: Alwyn Ren, AGACNP-BC [phone] 02/15/2020, 8:12 AM   I spent a total of  30 Minutes   in face to face in clinical consultation, greater than 50% of which was counseling/coordinating care for port-a-catheter placement.

## 2020-02-15 ENCOUNTER — Ambulatory Visit (HOSPITAL_COMMUNITY)
Admission: RE | Admit: 2020-02-15 | Discharge: 2020-02-15 | Disposition: A | Discharge status: Home / Self Care | Payer: Self-pay | Source: Ambulatory Visit | Attending: Hematology and Oncology | Admitting: Hematology and Oncology

## 2020-02-15 ENCOUNTER — Other Ambulatory Visit: Payer: Self-pay | Admitting: Hematology and Oncology

## 2020-02-15 ENCOUNTER — Encounter (HOSPITAL_COMMUNITY): Payer: Self-pay

## 2020-02-15 DIAGNOSIS — C8460 Anaplastic large cell lymphoma, ALK-positive, unspecified site: Secondary | ICD-10-CM | POA: Insufficient documentation

## 2020-02-15 DIAGNOSIS — Z87891 Personal history of nicotine dependence: Secondary | ICD-10-CM | POA: Insufficient documentation

## 2020-02-15 HISTORY — PX: IR IMAGING GUIDED PORT INSERTION: IMG5740

## 2020-02-15 HISTORY — DX: Malignant (primary) neoplasm, unspecified: C80.1

## 2020-02-15 MED ORDER — HEPARIN SOD (PORK) LOCK FLUSH 100 UNIT/ML IV SOLN
INTRAVENOUS | Status: AC | PRN
  Administered 2020-02-15: 500 [IU] via INTRAVENOUS

## 2020-02-15 MED ORDER — MIDAZOLAM HCL 2 MG/2ML IJ SOLN
INTRAMUSCULAR | Status: AC | PRN
  Administered 2020-02-15: 2 mg via INTRAVENOUS
  Administered 2020-02-15 (×2): 1 mg via INTRAVENOUS

## 2020-02-15 MED ORDER — HEPARIN SOD (PORK) LOCK FLUSH 100 UNIT/ML IV SOLN
INTRAVENOUS | Status: AC
  Filled 2020-02-15: qty 5

## 2020-02-15 MED ORDER — CEFAZOLIN SODIUM-DEXTROSE 2-4 GM/100ML-% IV SOLN
INTRAVENOUS | Status: AC
  Administered 2020-02-15: 2 g via INTRAVENOUS
  Filled 2020-02-15: qty 100

## 2020-02-15 MED ORDER — MIDAZOLAM HCL 2 MG/2ML IJ SOLN
INTRAMUSCULAR | Status: AC
  Filled 2020-02-15: qty 4

## 2020-02-15 MED ORDER — LIDOCAINE-EPINEPHRINE 1 %-1:100000 IJ SOLN
INTRAMUSCULAR | Status: AC
  Filled 2020-02-15: qty 1

## 2020-02-15 MED ORDER — CEFAZOLIN SODIUM-DEXTROSE 2-4 GM/100ML-% IV SOLN: 2.0000 g | Freq: Once | INTRAVENOUS | Status: AC

## 2020-02-15 MED ORDER — SODIUM CHLORIDE 0.9 % IV SOLN: INTRAVENOUS | Status: DC

## 2020-02-15 MED ORDER — FENTANYL CITRATE (PF) 100 MCG/2ML IJ SOLN
INTRAMUSCULAR | Status: AC
  Filled 2020-02-15: qty 2

## 2020-02-15 MED ORDER — FENTANYL CITRATE (PF) 100 MCG/2ML IJ SOLN
INTRAMUSCULAR | Status: AC | PRN
  Administered 2020-02-15 (×2): 50 ug via INTRAVENOUS

## 2020-02-15 MED ORDER — LIDOCAINE HCL (PF) 1 % IJ SOLN
INTRAMUSCULAR | Status: AC | PRN
  Administered 2020-02-15: 10 mL

## 2020-02-15 NOTE — Discharge Instructions (Signed)
Do not place numbing cream over your port site for 14 days.  The port site must heal first before using any numbing cream.  The numbing cream could cause the surgical glue to come off too soon.   Leave your dressing on for 24 hours, you may remove the dressing tomorrow, 02/16/20,Thursday at 11:00 am.  You do not need to place another dressing over the site.  Do not get the port site wet until after the dressing is removed.  After the dressing is removed you may shower.  Moderate Conscious Sedation, Adult, Care After This sheet gives you information about how to care for yourself after your procedure. Your health care provider may also give you more specific instructions. If you have problems or questions, contact your health care provider. What can I expect after the procedure? After the procedure, it is common to have:  Sleepiness for several hours.  Impaired judgment for several hours.  Difficulty with balance.  Vomiting if you eat too soon. Follow these instructions at home: For the time period you were told by your health care provider:  Rest.  Do not participate in activities where you could fall or become injured.  Do not drive or use machinery.  Do not drink alcohol.  Do not take sleeping pills or medicines that cause drowsiness.  Do not make important decisions or sign legal documents.  Do not take care of children on your own.     Eating and drinking  Follow the diet recommended by your health care provider.  Drink enough fluid to keep your urine pale yellow.  If you vomit: ? Drink water, juice, or soup when you can drink without vomiting. ? Make sure you have little or no nausea before eating solid foods.   General instructions  Take over-the-counter and prescription medicines only as told by your health care provider.  Have a responsible adult stay with you for the time you are told. It is important to have someone help care for you until you are awake and  alert.  Do not smoke.  Keep all follow-up visits as told by your health care provider. This is important. Contact a health care provider if:  You are still sleepy or having trouble with balance after 24 hours.  You feel light-headed.  You keep feeling nauseous or you keep vomiting.  You develop a rash.  You have a fever.  You have redness or swelling around the IV site. Get help right away if:  You have trouble breathing.  You have new-onset confusion at home. Summary  After the procedure, it is common to feel sleepy, have impaired judgment, or feel nauseous if you eat too soon.  Rest after you get home. Know the things you should not do after the procedure.  Follow the diet recommended by your health care provider and drink enough fluid to keep your urine pale yellow.  Get help right away if you have trouble breathing or new-onset confusion at home. This information is not intended to replace advice given to you by your health care provider. Make sure you discuss any questions you have with your health care provider. Document Revised: 05/06/2019 Document Reviewed: 12/02/2018 Elsevier Patient Education  2021 [place] Insertion, Care After This sheet gives you information about how to care for yourself after your procedure. Your health care provider may also give you more specific instructions. If you have problems or questions, contact your health care provider. What can I expect  after the procedure? After the procedure, it is common to have:  Discomfort at the port insertion site.  Bruising on the skin over the port. This should improve over 3-4 days. Follow these instructions at home: [facility] care  After your port is placed, you will get a manufacturer's information card. The card has information about your port. Keep this card with you at all times.  Take care of the port as told by your health care provider. Ask your health care provider if you or  a family member can get training for taking care of the port at home. A home health care nurse may also take care of the port.  Make sure to remember what type of port you have. Incision care  Follow instructions from your health care provider about how to take care of your port insertion site. Make sure you: ? Wash your hands with soap and water before and after you change your bandage (dressing). If soap and water are not available, use hand sanitizer. ? Change your dressing as told by your health care provider. ? Leave stitches (sutures), skin glue, or adhesive strips in place. These skin closures may need to stay in place for 2 weeks or longer. If adhesive strip edges start to loosen and curl up, you may trim the loose edges. Do not remove adhesive strips completely unless your health care provider tells you to do that.  Check your port insertion site every day for signs of infection. Check for: ? Redness, swelling, or pain. ? Fluid or blood. ? Warmth. ? Pus or a bad smell.      Activity  Return to your normal activities as told by your health care provider. Ask your health care provider what activities are safe for you.  Do not lift anything that is heavier than 10 lb (4.5 kg), or the limit that you are told, until your health care provider says that it is safe. General instructions  Take over-the-counter and prescription medicines only as told by your health care provider.  Do not take baths, swim, or use a hot tub until your health care provider approves. Ask your health care provider if you may take showers. You may only be allowed to take sponge baths.  Do not drive for 24 hours if you were given a sedative during your procedure.  Wear a medical alert bracelet in case of an emergency. This will tell any health care providers that you have a port.  Keep all follow-up visits as told by your health care provider. This is important. Contact a health care provider if:  You  cannot flush your port with saline as directed, or you cannot draw blood from the port.  You have a fever or chills.  You have redness, swelling, or pain around your port insertion site.  You have fluid or blood coming from your port insertion site.  Your port insertion site feels warm to the touch.  You have pus or a bad smell coming from the port insertion site. Get help right away if:  You have chest pain or shortness of breath.  You have bleeding from your port that you cannot control. Summary  Take care of the port as told by your health care provider. Keep the manufacturer's information card with you at all times.  Change your dressing as told by your health care provider.  Contact a health care provider if you have a fever or chills or if you have redness, swelling, or  pain around your port insertion site.  Keep all follow-up visits as told by your health care provider. This information is not intended to replace advice given to you by your health care provider. Make sure you discuss any questions you have with your health care provider. Document Revised: 08/04/2017 Document Reviewed: 08/04/2017 Elsevier Patient Education  [place].

## 2020-02-15 NOTE — Procedures (Signed)
Interventional Radiology Procedure Note  Procedure: RT IJ POWER PORT    Complications: None  Estimated Blood Loss:  0  Findings: TIP SVCRA    M. TREVOR Annelle Behrendt, MD    

## 2020-02-16 NOTE — Progress Notes (Signed)
Pharmacist Chemotherapy Monitoring - Initial Assessment    Anticipated start date: 02/23/20   Regimen:  . Are orders appropriate based on the patient's diagnosis, regimen, and cycle? Yes . Does the plan date match the patient's scheduled date? Yes . Is the sequencing of drugs appropriate? Yes . Are the premedications appropriate for the patient's regimen? Yes . Prior Authorization for treatment is: Uninsured o If applicable, is the correct biosimilar selected based on the patient's insurance? not applicable  Organ Function and Labs: Marland Kitchen Are dose adjustments needed based on the patient's renal function, hepatic function, or hematologic function? No . Are appropriate labs ordered prior to the start of patient's treatment? Yes . Other organ system assessment, if indicated: N/A . The following baseline labs, if indicated, have been ordered: N/A  Dose Assessment: . Are the drug doses appropriate? Yes . Are the following correct: o Drug concentrations Yes o IV fluid compatible with drug Yes o Administration routes Yes o Timing of therapy Yes . If applicable, does the patient have documented access for treatment and/or plans for port-a-cath placement? yes . If applicable, have lifetime cumulative doses been properly documented and assessed? yes Lifetime Dose Tracking  No doses have been documented on this patient for the following tracked chemicals: Doxorubicin, Epirubicin, Idarubicin, Daunorubicin, Mitoxantrone, Bleomycin, Oxaliplatin, Carboplatin, Liposomal Doxorubicin  o   Toxicity Monitoring/Prevention: . The patient has the following take home antiemetics prescribed: N/A . The patient has the following take home medications prescribed: N/A . Medication allergies and previous infusion related reactions, if applicable, have been reviewed and addressed. Yes . The patient's current medication list has been assessed for drug-drug interactions with their chemotherapy regimen. no significant  drug-drug interactions were identified on review.  Order Review: . Are the treatment plan orders signed? Yes . Is the patient scheduled to see a provider prior to their treatment? Yes  I verify that I have reviewed each item in the above checklist and answered each question accordingly.  Romualdo Bolk [facility] 02/16/2020 12:24 PM

## 2020-02-17 ENCOUNTER — Other Ambulatory Visit: Payer: Self-pay | Admitting: *Deleted

## 2020-02-17 ENCOUNTER — Telehealth: Payer: Self-pay | Admitting: *Deleted

## 2020-02-17 MED ORDER — ALLOPURINOL 300 MG PO TABS: 300.0000 mg | ORAL_TABLET | Freq: Every day | ORAL | 3 refills | Status: DC

## 2020-02-17 MED ORDER — ONDANSETRON HCL 8 MG PO TABS: 8.0000 mg | ORAL_TABLET | Freq: Three times a day (TID) | ORAL | 2 refills | Status: DC | PRN

## 2020-02-17 MED ORDER — PROCHLORPERAZINE MALEATE 10 MG PO TABS: 10.0000 mg | ORAL_TABLET | Freq: Four times a day (QID) | ORAL | 3 refills | Status: DC | PRN

## 2020-02-17 MED ORDER — LIDOCAINE-PRILOCAINE 2.5-2.5 % EX CREA: 1.0000 "application " | TOPICAL_CREAM | CUTANEOUS | 1 refills | Status: DC | PRN

## 2020-02-17 NOTE — Progress Notes (Signed)
..  The following Medication: Tanner Gallagher is approved for drug replacement program by [employer]. The enrollment period is from 02/17/2020 to 02/16/2021.  Reason for Assistance: Self Pay. ID: [id number] First DOS:02/25/2020.

## 2020-02-17 NOTE — Telephone Encounter (Signed)
TCT patient in response to his vm message requesting a call back. No answer but was able to leave vm message for pt to return call to [phone].

## 2020-02-17 NOTE — Telephone Encounter (Signed)
Received call back from patient. He states he is starting his chemo therapy next week and would like his prescriptions sent in to his pharmacy.   Prescriptions for Allopurinol, zofran, compazine and EMLA cream were sent in to [facility]

## 2020-02-20 ENCOUNTER — Telehealth: Payer: Self-pay | Admitting: *Deleted

## 2020-02-20 ENCOUNTER — Other Ambulatory Visit: Payer: Self-pay | Admitting: *Deleted

## 2020-02-20 MED ORDER — PREDNISONE 20 MG PO TABS: 100.0000 mg | ORAL_TABLET | Freq: Every day | ORAL | 0 refills | Status: DC

## 2020-02-20 MED FILL — ONDANSETRON HCL 8 MG TABLET: 8 | 7 days supply | Qty: 20 | Fill #0

## 2020-02-20 MED FILL — LIDOCAINE-PRILOCAINE CREAM: 2.5-2.5 | 7 days supply | Qty: 30 | Fill #0

## 2020-02-20 MED FILL — PROCHLORPERAZINE 10 MG TAB: 10 | 7 days supply | Qty: 30 | Fill #0

## 2020-02-20 MED FILL — predniSONE 20 MG TABS: 20 | 30 days supply | Qty: 25 | Fill #0

## 2020-02-20 MED FILL — ALLOPURINOL 300 MG TAB: 300 | 30 days supply | Qty: 30 | Fill #0

## 2020-02-20 NOTE — Progress Notes (Signed)
..  The following Medication: Adonis Housekeeper has been approved thru [employer] as Assistance Program. Enrollment period is 02/20/2020 to 02/19/2021.  Assistance ID: [id number]. Reason for Assistance: Self Pay First DOS: 02/23/2020

## 2020-02-20 NOTE — Telephone Encounter (Signed)
Received call from patient asking about his prescription for prednisone. This has not been sent in to his pharmacy. Reviewed Dr. Dr. Libby Maw office notes and prednisone is part of the treatment plan. 100mg  daily for 5 days, starting on day 1 . This was sent in to pt's pharmacy and instructed patient on how to take - in the morning with food, starting on day 1 of his treatment and then for the following 4 mornings. He voiced understanding.

## 2020-02-23 ENCOUNTER — Other Ambulatory Visit: Payer: Self-pay | Admitting: Hematology and Oncology

## 2020-02-23 ENCOUNTER — Inpatient Hospital Stay: Payer: Self-pay | Attending: Hematology and Oncology | Admitting: Hematology and Oncology

## 2020-02-23 ENCOUNTER — Other Ambulatory Visit: Payer: Self-pay

## 2020-02-23 ENCOUNTER — Inpatient Hospital Stay: Payer: Self-pay

## 2020-02-23 ENCOUNTER — Ambulatory Visit (HOSPITAL_COMMUNITY)
Admission: RE | Admit: 2020-02-23 | Discharge: 2020-02-23 | Disposition: A | Discharge status: Home / Self Care | Payer: Self-pay | Source: Ambulatory Visit | Attending: Hematology and Oncology | Admitting: Hematology and Oncology

## 2020-02-23 VITALS — BP 117/83 | HR 82 | Temp 97.4°F | Resp 18 | Ht 68.0 in | Wt 137.2 lb

## 2020-02-23 DIAGNOSIS — R590 Localized enlarged lymph nodes: Secondary | ICD-10-CM

## 2020-02-23 DIAGNOSIS — Z01818 Encounter for other preprocedural examination: Secondary | ICD-10-CM | POA: Insufficient documentation

## 2020-02-23 DIAGNOSIS — Z5189 Encounter for other specified aftercare: Secondary | ICD-10-CM | POA: Insufficient documentation

## 2020-02-23 DIAGNOSIS — C8408 Mycosis fungoides, lymph nodes of multiple sites: Secondary | ICD-10-CM | POA: Insufficient documentation

## 2020-02-23 DIAGNOSIS — Z87891 Personal history of nicotine dependence: Secondary | ICD-10-CM | POA: Insufficient documentation

## 2020-02-23 DIAGNOSIS — C8442 Peripheral T-cell lymphoma, not classified, intrathoracic lymph nodes: Secondary | ICD-10-CM

## 2020-02-23 DIAGNOSIS — C8460 Anaplastic large cell lymphoma, ALK-positive, unspecified site: Secondary | ICD-10-CM | POA: Insufficient documentation

## 2020-02-23 DIAGNOSIS — Z5111 Encounter for antineoplastic chemotherapy: Secondary | ICD-10-CM | POA: Insufficient documentation

## 2020-02-23 DIAGNOSIS — Z7952 Long term (current) use of systemic steroids: Secondary | ICD-10-CM | POA: Insufficient documentation

## 2020-02-23 DIAGNOSIS — Z0189 Encounter for other specified special examinations: Secondary | ICD-10-CM

## 2020-02-23 DIAGNOSIS — Z5112 Encounter for antineoplastic immunotherapy: Secondary | ICD-10-CM | POA: Insufficient documentation

## 2020-02-23 DIAGNOSIS — R599 Enlarged lymph nodes, unspecified: Secondary | ICD-10-CM

## 2020-02-23 LAB — CBC WITH DIFFERENTIAL (CANCER CENTER ONLY)
Abs Immature Granulocytes: 0.01 10*3/uL (ref 0.00–0.07)
Basophils Absolute: 0 10*3/uL (ref 0.0–0.1)
Basophils Relative: 1 %
Eosinophils Absolute: 0.2 10*3/uL (ref 0.0–0.5)
Eosinophils Relative: 4 %
HCT: 36.8 % — ABNORMAL LOW (ref 39.0–52.0)
Hemoglobin: 12.4 g/dL — ABNORMAL LOW (ref 13.0–17.0)
Immature Granulocytes: 0 %
Lymphocytes Relative: 28 %
Lymphs Abs: 1.6 10*3/uL (ref 0.7–4.0)
MCH: 31 pg (ref 26.0–34.0)
MCHC: 33.7 g/dL (ref 30.0–36.0)
MCV: 92 fL (ref 80.0–100.0)
Monocytes Absolute: 0.4 10*3/uL (ref 0.1–1.0)
Monocytes Relative: 7 %
Neutro Abs: 3.4 10*3/uL (ref 1.7–7.7)
Neutrophils Relative %: 60 %
Platelet Count: 230 10*3/uL (ref 150–400)
RBC: 4 MIL/uL — ABNORMAL LOW (ref 4.22–5.81)
RDW: 14.6 % (ref 11.5–15.5)
WBC Count: 5.6 10*3/uL (ref 4.0–10.5)
nRBC: 0 % (ref 0.0–0.2)

## 2020-02-23 LAB — CMP (CANCER CENTER ONLY)
ALT: 14 U/L (ref 0–44)
AST: 16 U/L (ref 15–41)
Albumin: 3.6 g/dL (ref 3.5–5.0)
Alkaline Phosphatase: 68 U/L (ref 38–126)
Anion gap: 6 (ref 5–15)
BUN: 15 mg/dL (ref 6–20)
CO2: 28 mmol/L (ref 22–32)
Calcium: 9.2 mg/dL (ref 8.9–10.3)
Chloride: 105 mmol/L (ref 98–111)
Creatinine: 0.91 mg/dL (ref 0.61–1.24)
GFR, Estimated: >60 mL/min (ref >60)
Glucose, Bld: 136 mg/dL — ABNORMAL HIGH (ref 70–99)
Potassium: 4 mmol/L (ref 3.5–5.1)
Sodium: 139 mmol/L (ref 135–145)
Total Bilirubin: 0.5 mg/dL (ref 0.3–1.2)
Total Protein: 7.6 g/dL (ref 6.5–8.1)

## 2020-02-23 LAB — URIC ACID: Uric Acid, Serum: 3.7 mg/dL (ref 3.7–8.6)

## 2020-02-23 LAB — LACTATE DEHYDROGENASE: LDH: 208 U/L — ABNORMAL HIGH (ref 98–192)

## 2020-02-23 MED ORDER — DIPHENHYDRAMINE HCL 50 MG/ML IJ SOLN
INTRAMUSCULAR | Status: AC
  Filled 2020-02-23: qty 1

## 2020-02-23 MED ORDER — SODIUM CHLORIDE 0.9 % IV SOLN
10.0000 mg | Freq: Once | INTRAVENOUS | Status: AC
  Administered 2020-02-23: 10 mg via INTRAVENOUS
  Filled 2020-02-23: qty 10

## 2020-02-23 MED ORDER — DOXORUBICIN HCL CHEMO IV INJECTION 2 MG/ML
50.0000 mg/m2 | Freq: Once | INTRAVENOUS | Status: AC
  Administered 2020-02-23: 88 mg via INTRAVENOUS
  Filled 2020-02-23: qty 44

## 2020-02-23 MED ORDER — SODIUM CHLORIDE 0.9 % IV SOLN
750.0000 mg/m2 | Freq: Once | INTRAVENOUS | Status: AC
  Administered 2020-02-23: 1320 mg via INTRAVENOUS
  Filled 2020-02-23: qty 66

## 2020-02-23 MED ORDER — SODIUM CHLORIDE 0.9% FLUSH
10.0000 mL | INTRAVENOUS | Status: DC | PRN
  Filled 2020-02-23: qty 10

## 2020-02-23 MED ORDER — SODIUM CHLORIDE 0.9 % IV SOLN
1.8000 mg/kg | Freq: Once | INTRAVENOUS | Status: AC
  Administered 2020-02-23: 115 mg via INTRAVENOUS
  Filled 2020-02-23: qty 23

## 2020-02-23 MED ORDER — SODIUM CHLORIDE 0.9 % IV SOLN
Freq: Once | INTRAVENOUS | Status: AC
  Filled 2020-02-23: qty 250

## 2020-02-23 MED ORDER — DIPHENHYDRAMINE HCL 50 MG/ML IJ SOLN
50.0000 mg | Freq: Once | INTRAMUSCULAR | Status: AC
  Administered 2020-02-23: 50 mg via INTRAVENOUS

## 2020-02-23 MED ORDER — ACETAMINOPHEN 325 MG PO TABS
650.0000 mg | ORAL_TABLET | Freq: Once | ORAL | Status: AC
  Administered 2020-02-23: 650 mg via ORAL

## 2020-02-23 MED ORDER — ACETAMINOPHEN 325 MG PO TABS
ORAL_TABLET | ORAL | Status: AC
  Filled 2020-02-23: qty 2

## 2020-02-23 MED ORDER — SODIUM CHLORIDE 0.9 % IV SOLN
150.0000 mg | Freq: Once | INTRAVENOUS | Status: AC
  Administered 2020-02-23: 150 mg via INTRAVENOUS
  Filled 2020-02-23: qty 150

## 2020-02-23 MED ORDER — PALONOSETRON HCL INJECTION 0.25 MG/5ML
INTRAVENOUS | Status: AC
  Filled 2020-02-23: qty 5

## 2020-02-23 MED ORDER — PALONOSETRON HCL INJECTION 0.25 MG/5ML
0.2500 mg | Freq: Once | INTRAVENOUS | Status: AC
  Administered 2020-02-23: 0.25 mg via INTRAVENOUS

## 2020-02-23 MED ORDER — HEPARIN SOD (PORK) LOCK FLUSH 100 UNIT/ML IV SOLN
500.0000 [IU] | Freq: Once | INTRAVENOUS | Status: AC | PRN
  Administered 2020-02-23: 500 [IU]
  Filled 2020-02-23: qty 5

## 2020-02-23 NOTE — Patient Instructions (Signed)
[facility] Discharge Instructions for Patients Receiving Chemotherapy  Today you received the following chemotherapy agents Doxorubicin, Cyclophosphamide, Brentuximab  To help prevent nausea and vomiting after your treatment, we encourage you to take your nausea medication as directed.   If you develop nausea and vomiting that is not controlled by your nausea medication, call the clinic.   BELOW ARE SYMPTOMS THAT SHOULD BE REPORTED IMMEDIATELY:  *FEVER GREATER THAN 100.5 F  *CHILLS WITH OR WITHOUT FEVER  NAUSEA AND VOMITING THAT IS NOT CONTROLLED WITH YOUR NAUSEA MEDICATION  *UNUSUAL SHORTNESS OF BREATH  *UNUSUAL BRUISING OR BLEEDING  TENDERNESS IN MOUTH AND THROAT WITH OR WITHOUT PRESENCE OF ULCERS  *URINARY PROBLEMS  *BOWEL PROBLEMS  UNUSUAL RASH Items with * indicate a potential emergency and should be followed up as soon as possible.  Feel free to call the clinic should you have any questions or concerns. The clinic phone number is [phone].  Please show the [place] at check-in to the Emergency Department and triage nurse.

## 2020-02-23 NOTE — Progress Notes (Signed)
Ok to proceed with ECHO not resulted out yet from today's study.  T.O. Dr Claudean Severance Aurora Mask, PharmD 02/23/2020 @ [phone]

## 2020-02-23 NOTE — Progress Notes (Signed)
  Echocardiogram 2D Echocardiogram has been performed.  Michiel Cowboy 02/23/2020, 10:56 AM

## 2020-02-24 ENCOUNTER — Encounter (HOSPITAL_COMMUNITY)
Admission: RE | Admit: 2020-02-24 | Discharge: 2020-02-24 | Disposition: A | Discharge status: Home / Self Care | Payer: Self-pay | Source: Ambulatory Visit | Attending: Hematology and Oncology | Admitting: Hematology and Oncology

## 2020-02-24 DIAGNOSIS — C8460 Anaplastic large cell lymphoma, ALK-positive, unspecified site: Secondary | ICD-10-CM | POA: Insufficient documentation

## 2020-02-24 LAB — ECHOCARDIOGRAM COMPLETE
Area-P 1/2: 2.56 cm2
Calc EF: 54.2 %
Height: 68 in
MV M vel: 3.25 m/s
MV Peak grad: 42.3 mmHg
S' Lateral: 3.5 cm
Single Plane A2C EF: 55.8 %
Single Plane A4C EF: 52.7 %
Weight: 2195.2 oz

## 2020-02-24 LAB — GLUCOSE, CAPILLARY: Glucose-Capillary: 101 mg/dL — ABNORMAL HIGH (ref 70–99)

## 2020-02-24 MED ORDER — FLUDEOXYGLUCOSE F - 18 (FDG) INJECTION: 6.7000 | Freq: Once | INTRAVENOUS | Status: DC | PRN

## 2020-02-24 MED ORDER — FLUDEOXYGLUCOSE F - 18 (FDG) INJECTION
6.7000 | Freq: Once | INTRAVENOUS | Status: AC | PRN
  Administered 2020-02-24: 6.7 via INTRAVENOUS

## 2020-02-25 ENCOUNTER — Other Ambulatory Visit: Payer: Self-pay

## 2020-02-25 ENCOUNTER — Inpatient Hospital Stay: Payer: Self-pay

## 2020-02-25 VITALS — BP 118/69 | HR 72 | Temp 98.0°F

## 2020-02-25 MED ORDER — PEGFILGRASTIM-CBQV 6 MG/0.6ML ~~LOC~~ SOSY
6.0000 mg | PREFILLED_SYRINGE | Freq: Once | SUBCUTANEOUS | Status: AC
Start: 2020-02-25 — End: 2020-02-25
  Administered 2020-02-25: 6 mg via SUBCUTANEOUS

## 2020-02-25 NOTE — Progress Notes (Signed)
Patient tolerated injection well. Discharged home.  

## 2020-02-26 ENCOUNTER — Encounter: Payer: Self-pay | Admitting: Hematology and Oncology

## 2020-02-26 NOTE — Progress Notes (Signed)
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 Previously seen in June 2021 and given course of doxycycline. Infectious workup including RPR, HIV, GC/chlamydia were negative.  2) 09/23/2019: presented to PCP with concern for bilaterally enlarged lymph nodes of the groin. Noted to have scant drainage 3) 10/12/2019: establish care with Dr. 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  5) 02/23/2020: Cycle 1 Day 1 of B-CHP  Interval History:  Tanner Gallagher 55 y.o. male with medical history significant for Anaplastic T Cell Lymphoma who presents for a follow up visit. The patient's last visit was on 02/08/2020. In the interim since the last visit the patient he has completed port placement, PET, and TTE. He is here for Cycle 1 Day 1 of treatment today.   On exam today Tanner Gallagher notes that he is at his baseline level of health and that he has not been having any issues with fevers, chills, sweats, nausea, or diarrhea.  He reports his appetite has been good.  He has quit cigarettes entirely, though he does occasionally smoke marijuana to help improve his appetite.  He is also concerned about some new lymphadenopathy that is palpated in his underarms.  His energy and appetite are both good and his weight is stable.  He has there been no other major changes in his health.  A full 10 point ROS is listed below.   MEDICAL HISTORY:  Past Medical History:  Diagnosis Date  . Cancer (HCC)    lymph nodes  . Chronic lower back pain 15 years  . Medical history non-contributory   . Tooth pain    no abscess or drainage per pt 02-01-2020    SURGICAL HISTORY: Past  Surgical History:  Procedure Laterality Date  . INCISION AND DRAINAGE PERIRECTAL ABSCESS  06/16/2017  . INCISION AND DRAINAGE PERIRECTAL ABSCESS N/A 06/16/2017   Procedure: IRRIGATION AND DEBRIDEMENT PERIRECTAL ABSCESS;  Surgeon: Coralie Keens, MD;  Location: [facility];  Service: General;  Laterality: N/A;  . INGUINAL HERNIA REPAIR Right 2000s  . IR IMAGING GUIDED PORT INSERTION  02/15/2020  . LYMPH NODE BIOPSY Right 02/02/2020   Procedure: EXCISIONAL INGUINAL LYMPH NODE BIOPSY;  Surgeon: Leighton Ruff, MD;  Location: [facility];  Service: General;  Laterality: Right;  . PROCTOSCOPY  06/16/2017   Procedure: RIGID PROCTOSCOPY;  Surgeon: Coralie Keens, MD;  [facility];  Service: General;;    SOCIAL HISTORY: Social History   Socioeconomic History  . Marital status: Married    Spouse name: Not on file  . Number of children: Not on file  . Years of education: Not on file  . Highest education level: Not on file  Occupational History  . Not on file  Tobacco Use  . Smoking status: Former Smoker    Packs/day: 0.12    Years: 37.00    Pack years: 4.44    Types: Cigarettes    Quit date: 05/20/2017    Years since quitting: 2.7  . Smokeless tobacco: Never Used  Vaping Use  . Vaping Use: Never used  Substance and Sexual Activity  . Alcohol use: Not Currently    Comment: rarew  . Drug use: Yes  Types: Marijuana    Comment: 02-01-2020 "once a day"  . Sexual activity: Yes  Other Topics Concern  . Not on file  Social History Narrative  . Not on file   Social Determinants of Health   Financial Resource Strain: Not on file  Food Insecurity: Not on file  Transportation Needs: Not on file  Physical Activity: Not on file  Stress: Not on file  Social Connections: Not on file  Intimate Partner Violence: Not on file    FAMILY HISTORY: Family History  Problem Relation Age of Onset  . Diabetes Neg Hx     ALLERGIES:  has No Known Allergies.  MEDICATIONS:   Current Outpatient Medications  Medication Sig Dispense Refill  . acetaminophen (TYLENOL) 500 MG tablet Take 1,000 mg by mouth every 6 (six) hours as needed.    Marland Kitchen allopurinol (ZYLOPRIM) 300 MG tablet Take 1 tablet (300 mg total) by mouth daily. 30 tablet 3  . ibuprofen (ADVIL) 200 MG tablet Take 200 mg by mouth every 6 (six) hours as needed. Prn for tooth pain    . lidocaine-prilocaine (EMLA) cream Apply 1 application topically as needed. 30 g 1  . ondansetron (ZOFRAN) 8 MG tablet Take 1 tablet (8 mg total) by mouth every 8 (eight) hours as needed for nausea or vomiting. 20 tablet 2  . predniSONE (DELTASONE) 20 MG tablet Take 5 tablets (100 mg total) by mouth daily with breakfast. Take for 5 days, starting on Day 1 of your chemotherapy 100 tablet 0  . prochlorperazine (COMPAZINE) 10 MG tablet Take 1 tablet (10 mg total) by mouth every 6 (six) hours as needed for nausea or vomiting. 30 tablet 3   No current facility-administered medications for this visit.    REVIEW OF SYSTEMS:   Constitutional: ( - ) fevers, ( - )  chills , ( - ) night sweats Eyes: ( - ) blurriness of vision, ( - ) double vision, ( - ) watery eyes Ears, nose, mouth, throat, and face: ( - ) mucositis, ( - ) sore throat Respiratory: ( - ) cough, ( - ) dyspnea, ( - ) wheezes Cardiovascular: ( - ) palpitation, ( - ) chest discomfort, ( - ) lower extremity swelling Gastrointestinal:  ( - ) nausea, ( - ) heartburn, ( - ) change in bowel habits Skin: ( - ) abnormal skin rashes Lymphatics: ( - ) new lymphadenopathy, ( - ) easy bruising Neurological: ( - ) numbness, ( - ) tingling, ( - ) new weaknesses Behavioral/Psych: ( - ) mood change, ( - ) new changes  All other systems were reviewed with the patient and are negative.  PHYSICAL EXAMINATION: ECOG PERFORMANCE STATUS: 0 - Asymptomatic  Vitals:   02/23/20 0935  BP: 117/83  Pulse: 82  Resp: 18  Temp: (!) 97.4 F (36.3 C)  SpO2: 100%   Filed Weights   02/23/20 0935   Weight: 137 lb 3.2 oz (62.2 kg)    GENERAL: well appearing middle aged [place] American male. alert, no distress and comfortable SKIN: skin color, texture, turgor are normal, no rashes or significant lesions EYES: conjunctiva are pink and non-injected, sclera clear LYMPH:  Deferred today.  LUNGS: clear to auscultation and percussion with normal breathing effort HEART: regular rate & rhythm and no murmurs and no lower extremity edema Musculoskeletal: no cyanosis of digits and no clubbing  PSYCH: alert & oriented x 3, fluent speech NEURO: no focal motor/sensory deficits  LABORATORY DATA:  I have reviewed the data  as listed CBC Latest Ref Rng & Units 02/23/2020 02/08/2020 12/09/2019  WBC 4.0 - 10.5 K/uL 5.6 4.5 4.6  Hemoglobin 13.0 - 17.0 g/dL 12.4(L) 12.7(L) 12.7(L)  Hematocrit 39.0 - 52.0 % 36.8(L) 37.5(L) 38.0(L)  Platelets 150 - 400 K/uL 230 239 228    CMP Latest Ref Rng & Units 02/23/2020 02/08/2020 12/09/2019  Glucose 70 - 99 mg/dL 136(H) 106(H) 87  BUN 6 - 20 mg/dL 15 15 22(H)  Creatinine 0.61 - 1.24 mg/dL 0.91 0.98 1.13  Sodium 135 - 145 mmol/L 139 140 143  Potassium 3.5 - 5.1 mmol/L 4.0 4.4 4.2  Chloride 98 - 111 mmol/L 105 106 107  CO2 22 - 32 mmol/L _0 Calcium 8.9 - 10.3 mg/dL 9.2 9.6 9.2  Total Protein 6.5 - 8.1 g/dL 7.6 7.5 7.5  Total Bilirubin 0.3 - 1.2 mg/dL 0.5 0.5 0.4  Alkaline Phos 38 - 126 U/L 68 58 59  AST 15 - 41 U/L _1 ALT 0 - 44 U/L _2 RADIOGRAPHIC STUDIES: NM PET Image Initial (PI) Skull Base To Thigh  Result Date: 02/24/2020 CLINICAL DATA:  Initial treatment strategy for T-cell and null cell lymphoma. Chemotherapy yesterday. EXAM: NUCLEAR MEDICINE PET SKULL BASE TO THIGH TECHNIQUE: 6.7 mCi F-18 FDG was injected intravenously. Full-ring PET imaging was performed from the skull base to thigh after the radiotracer. CT data was obtained and used for attenuation correction and anatomic localization. Fasting blood glucose: 101 mg/dl  COMPARISON:  10/21/2019 CT abdomen/pelvis. FINDINGS: Mediastinal blood pool activity: SUV max 1.9 Liver activity: SUV max 2.6 NECK: No hypermetabolic lymph nodes in the neck. Incidental CT findings: Right internal jugular Port-A-Cath terminates at the cavoatrial junction. CHEST: Enlarged hypermetabolic bilateral axillary lymph nodes. Representative 1.2 cm short axis diameter left axillary node with max SUV 6.8 (series 4/image 47). Representative 1.4 cm right axillary node with max SUV 3.0 (series 4/image 50). No enlarged or hypermetabolic mediastinal or hilar nodes. No hypermetabolic pulmonary findings. Tiny 0.5 cm cutaneous focus of hypermetabolism in the lateral right chest wall with max SUV 2.5 (series 4/image 85). Small 1.0 cm cutaneous focus of hypermetabolism in the low right thoracic back with max SUV 4.9 (series 4/image 118). Incidental CT findings: Coronary atherosclerosis. A few scattered small solid pulmonary nodules in the lungs bilaterally, largest 4 mm in the right middle lobe (series 8/image 45). ABDOMEN/PELVIS: No abnormal hypermetabolic activity within the liver, pancreas, adrenal glands, or spleen. No hypermetabolic lymph nodes in the abdomen. Enlarged hypermetabolic bilateral inguinal lymph nodes. Representative 2.2 cm left inguinal node with max SUV 6.3 (series 4/image 187). Representative 2.2 cm right inguinal node with max SUV 6.9 (series 4/image 188). Nonspecific circumferential hypermetabolism at the anorectal junction with max SUV 6.9, with hypermetabolism extending into the levator musculature bilaterally, right greater than left with max SUV 8.3 on the right associated with a vague low-attenuation 1.9 x 1.4 cm collection (series 4/image 183). Incidental CT findings: Mildly atherosclerotic nonaneurysmal abdominal aorta. SKELETON: Small hypermetabolic right sternal lesion with max SUV 3.6, CT occult. No additional hypermetabolic skeletal lesions. Incidental CT findings: none IMPRESSION: 1.  Hypermetabolic bilateral axillary and bilateral inguinal lymphadenopathy, compatible with lymphoma. Deauville category for activity. 2. Solitary small hypermetabolic right sternal lesion, suspicious for skeletal involvement by lymphoma. 3. Two small cutaneous foci of hypermetabolism in the right chest wall, cannot exclude cutaneous involvement by lymphoma. 4. Nonspecific circumferential hypermetabolism at the anorectal junction extending into the right greater the left levator  musculature, indeterminate for inflammatory (such as anal fistula related) versus neoplastic activity. Anal fistula protocol MRI pelvis without and with IV contrast could be obtained for further characterization as clinically warranted. 5. Small scattered bilateral solid pulmonary nodules, largest 4 mm, below PET resolution. Recommend attention on follow-up chest CT in 3-6 months. 6. Aortic Atherosclerosis (ICD10-I70.0). Electronically Signed   By: Ilona Sorrel M.D.   On: 02/24/2020 09:40   ECHOCARDIOGRAM COMPLETE  Result Date: 02/24/2020    ECHOCARDIOGRAM REPORT   Patient Name:   Tanner Gallagher Date of Exam: 02/23/2020 Medical Rec #:  [id number]   Height:       68.0 in Accession #:    [id number]  Weight:       140.0 lb Date of Birth:  March 01, 1965   BSA:          1.756 m Patient Age:    59 years    BP:           117/83 mmHg Patient Gender: M           HR:           72 bpm. Exam Location:  Outpatient Procedure: 2D Echo, Cardiac Doppler, Color Doppler and Strain Analysis Indications:    Chemo Z09  History:        Patient has no prior history of Echocardiogram examinations.                 Risk Factors:Former Smoker.  Sonographer:    Vickie Epley RDCS Referring Phys: [id number] [place]  1. Left ventricular ejection fraction, by estimation, is 55 to 60%. The left ventricle has normal function. The left ventricle has no regional wall motion abnormalities. Left ventricular diastolic parameters were normal. The average left ventricular  global longitudinal strain is -21.4 %. The global longitudinal strain is normal.  2. Right ventricular systolic function is normal. The right ventricular size is normal. There is normal pulmonary artery systolic pressure.  3. Redundant mitral valve chords that are very mobile and demonstrate systolic anterior motion into the LVOT. No obstruction noted, unlikely to be of clinical significance given normal mitral valve function. . The mitral valve is grossly normal. Trivial  mitral valve regurgitation. No evidence of mitral stenosis.  4. The aortic valve is tricuspid. Aortic valve regurgitation is not visualized. No aortic stenosis is present.  5. Borderline aortic dilatation noted. There is borderline dilatation of the aortic root, measuring 38 mm.  6. The inferior vena cava is normal in size with greater than 50% respiratory variability, suggesting right atrial pressure of 3 mmHg. FINDINGS  Left Ventricle: Left ventricular ejection fraction, by estimation, is 55 to 60%. The left ventricle has normal function. The left ventricle has no regional wall motion abnormalities. The average left ventricular global longitudinal strain is -21.4 %. The global longitudinal strain is normal. The left ventricular internal cavity size was normal in size. There is no left ventricular hypertrophy. Left ventricular diastolic parameters were normal. Right Ventricle: The right ventricular size is normal. No increase in right ventricular wall thickness. Right ventricular systolic function is normal. There is normal pulmonary artery systolic pressure. The tricuspid regurgitant velocity is 1.53 m/s, and  with an assumed right atrial pressure of 3 mmHg, the estimated right ventricular systolic pressure is 74.1 mmHg. Left Atrium: Left atrial size was normal in size. Right Atrium: Right atrial size was normal in size. Pericardium: Trivial pericardial effusion is present. Mitral Valve: Redundant mitral valve chords that are  very mobile and  demonstrate systolic anterior motion into the LVOT. No obstruction noted, unlikely to be of clinical significance given normal mitral valve function. The mitral valve is grossly normal.  Trivial mitral valve regurgitation. No evidence of mitral valve stenosis. Tricuspid Valve: The tricuspid valve is normal in structure. Tricuspid valve regurgitation is trivial. No evidence of tricuspid stenosis. Aortic Valve: The aortic valve is tricuspid. Aortic valve regurgitation is not visualized. No aortic stenosis is present. Pulmonic Valve: The pulmonic valve was normal in structure. Pulmonic valve regurgitation is trivial. No evidence of pulmonic stenosis. Aorta: Aortic dilatation noted. There is borderline dilatation of the aortic root, measuring 38 mm. Venous: The inferior vena cava is normal in size with greater than 50% respiratory variability, suggesting right atrial pressure of 3 mmHg. IAS/Shunts: No atrial level shunt detected by color flow Doppler.  LEFT VENTRICLE PLAX 2D LVIDd:         5.20 cm      Diastology LVIDs:         3.50 cm      LV e' medial:    7.62 cm/s LV PW:         0.70 cm      LV E/e' medial:  6.1 LV IVS:        0.70 cm      LV e' lateral:   10.20 cm/s LVOT diam:     2.50 cm      LV E/e' lateral: 4.6 LV SV:         78 LV SV Index:   44           2D Longitudinal Strain LVOT Area:     4.91 cm     2D Strain GLS Avg:     -21.4 %  LV Volumes (MOD) LV vol d, MOD A2C: 141.0 ml LV vol d, MOD A4C: 131.0 ml LV vol s, MOD A2C: 62.3 ml LV vol s, MOD A4C: 62.0 ml LV SV MOD A2C:     78.7 ml LV SV MOD A4C:     131.0 ml LV SV MOD BP:      73.7 ml RIGHT VENTRICLE RV S prime:     15.00 cm/s TAPSE (M-mode): 2.3 cm LEFT ATRIUM           Index       RIGHT ATRIUM           Index LA diam:      2.50 cm 1.42 cm/m  RA Area:     11.20 cm LA Vol (A2C): 25.3 ml 14.41 ml/m RA Volume:   26.20 ml  14.92 ml/m LA Vol (A4C): 17.0 ml 9.68 ml/m  AORTIC VALVE LVOT Vmax:   77.20 cm/s LVOT Vmean:  50.400 cm/s LVOT VTI:    0.159 m   AORTA Ao Root diam: 3.80 cm Ao Asc diam:  3.40 cm MITRAL VALVE               TRICUSPID VALVE MV Area (PHT): 2.56 cm    TR Peak grad:   9.4 mmHg MV Decel Time: 296 msec    TR Vmax:        153.00 cm/s MR Peak grad: 42.2 mmHg MR Vmax:      325.00 cm/s  SHUNTS MV E velocity: 46.50 cm/s  Systemic VTI:  0.16 m MV A velocity: 45.00 cm/s  Systemic Diam: 2.50 cm MV E/A ratio:  1.03 Cherlynn Kaiser MD Electronically signed by Cherlynn Kaiser MD Signature Date/Time: 02/24/2020/6:52:56 AM  Final    IR IMAGING GUIDED PORT INSERTION  Result Date: 02/15/2020 CLINICAL DATA:  Lymphoma, access for chemotherapy EXAM: RIGHT INTERNAL JUGULAR SINGLE LUMEN POWER PORT CATHETER INSERTION Date:  02/15/2020 02/15/2020 10:03 am Radiologist:  M. Daryll Brod, MD Guidance:  Ultrasound and fluoroscopic MEDICATIONS: Ancef 2 g; The antibiotic was administered within an appropriate time interval prior to skin puncture. ANESTHESIA/SEDATION: Versed 4.0 mg IV; Fentanyl 100 mcg IV; Moderate Sedation Time:  30 minutes The patient was continuously monitored during the procedure by the interventional radiology nurse under my direct supervision. FLUOROSCOPY TIME:  One minutes, 18 seconds (7 mGy) COMPLICATIONS: None immediate. CONTRAST:  None. PROCEDURE: Informed consent was obtained from the patient following explanation of the procedure, risks, benefits and alternatives. The patient understands, agrees and consents for the procedure. All questions were addressed. A time out was performed. Maximal barrier sterile technique utilized including caps, mask, sterile gowns, sterile gloves, large sterile drape, hand hygiene, and 2% chlorhexidine scrub. Under sterile conditions and local anesthesia, right internal jugular micropuncture venous access was performed. Access was performed with ultrasound. Images were obtained for documentation of the patent right internal jugular vein. A guide wire was inserted followed by a transitional dilator. This allowed  insertion of a guide wire and catheter into the IVC. Measurements were obtained from the SVC / RA junction back to the right IJ venotomy site. In the right infraclavicular chest, a subcutaneous pocket was created over the second anterior rib. This was done under sterile conditions and local anesthesia. 1% lidocaine with epinephrine was utilized for this. A 2.5 cm incision was made in the skin. Blunt dissection was performed to create a subcutaneous pocket over the right pectoralis major muscle. The pocket was flushed with saline vigorously. There was adequate hemostasis. The port catheter was assembled and checked for leakage. The port catheter was secured in the pocket with two retention sutures. The tubing was tunneled subcutaneously to the right venotomy site and inserted into the SVC/RA junction through a valved peel-away sheath. Position was confirmed with fluoroscopy. Images were obtained for documentation. The patient tolerated the procedure well. No immediate complications. Incisions were closed in a two layer fashion with 4 - 0 Vicryl suture. Dermabond was applied to the skin. The port catheter was accessed, blood was aspirated followed by saline and heparin flushes. Needle was removed. A dry sterile dressing was applied. IMPRESSION: Ultrasound and fluoroscopically guided right internal jugular single lumen power port catheter insertion. Tip in the SVC/RA junction. Catheter ready for use. Electronically Signed   By: Jerilynn Mages.  Shick M.D.   On: 02/15/2020 10:19    ASSESSMENT & PLAN Tanner Gallagher 55 y.o. male with no significant medical history presents for evaluation of Anaplastic T Cell Lymphoma.  Review the labs, review the records, discussion with the patient the findings are most consistent with Anaplastic T Cell Lymphoma.     PET CT scan performed on 02/24/2020 confirmed Stage III disease with involvement of the axillary and inguinal lymph nodes. He has also had a port placed and echocardiogram performed.    Today Tanner Gallagher has had the opportunity to ask any questions or concerns that he has had about his disease or chemotherapy moving forward.  He notes that he is aware of the risks and benefits of treatment and is willing and able to proceed with treatment today.  The treatment of choice for this patient's cancer would be B-CHP chemotherapy.  This regimen consists of doxorubicin 50 mg per metered squared on day  1, cyclophosphamide 750 mg per metered squared on day 1, and brentuximab 1.8 mg/kg on day 1 of a 21-day cycle.  The patient also received prednisone 170m PO on days 1-5.  We will proceed for 6 cycles.  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  --port had been placed, TTE shows adequate heart function for anthracycline therapy.  --PET CT scan shows involvement of the inguinal/axillary lymph nodes. Findings consistent with Stage III disease.  --plan for B-CHP chemotherapy as noted above. Cycle 1 Day 1 of treatment on 02/23/2020.  --RTC on 03/15/2020 for Cycle 2 Day 1 of treatment and a clinic visit with interval weekly labs.   #Supportive Care --chemotherapy education to be scheduled  --zofran [id number] PRN and compazine 1024mO q6H for nausea -- allopurinol 300m3m daily for TLS prophylaxis -- EMLA cream for port -- no pain medication required at this time.   No orders of the defined types were placed in this encounter.   All questions were answered. The patient knows to call the clinic with any problems, questions or concerns.  A total of more than 30 minutes were spent on this encounter and over half of that time was spent on counseling and coordination of care as outlined above.   JohnLedell Peoples Department of Hematology/Oncology [facility]: [phone]: [phone]: [email or web address]  02/26/2020 6:07 PM   Literature  Support:  HorwLynden Ang Pro B, IlliCalionnaValley ForgevaPeoartLillieriOrrvillerschhauser F, Domingo-Domenech E, RossMargot Chimes [address] , TobiJanesvilleukAdjuntash [place] Shustov A, Httmann A, SavaPounden S, Iyer S, Zinzani PL, Hua Z, Little M, Rao Romelle StarcherTrmper L; [employer]. 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 Lancet. 2019 Jan 19;393(10168):229-240.   --Median progression-free survival was 482 months (95% CI 352-not evaluable) in the A+CHP group and 208 months (127-476) in the CHOP group (hazard ratio 071 [95% CI 054-093], p=[id number]).   --Front-line treatment with A+CHP is superior to CHOP for patients with CD30-positive peripheral T-cell lymphomas as shown by a significant improvement in progression-free survival and overall survival with a manageable safety profile.

## 2020-02-27 ENCOUNTER — Telehealth: Payer: Self-pay | Admitting: Hematology and Oncology

## 2020-02-27 NOTE — Telephone Encounter (Signed)
Scheduled appt per 2/7 sch msg - left message for pt with appt date and time

## 2020-03-01 ENCOUNTER — Other Ambulatory Visit: Payer: Self-pay | Admitting: *Deleted

## 2020-03-01 ENCOUNTER — Other Ambulatory Visit: Payer: Self-pay

## 2020-03-01 ENCOUNTER — Inpatient Hospital Stay: Payer: Self-pay

## 2020-03-01 ENCOUNTER — Encounter: Payer: Self-pay | Admitting: Hematology and Oncology

## 2020-03-01 ENCOUNTER — Other Ambulatory Visit: Payer: Self-pay | Admitting: Hematology and Oncology

## 2020-03-01 DIAGNOSIS — C8460 Anaplastic large cell lymphoma, ALK-positive, unspecified site: Secondary | ICD-10-CM

## 2020-03-01 DIAGNOSIS — Z95828 Presence of other vascular implants and grafts: Secondary | ICD-10-CM

## 2020-03-01 LAB — CMP (CANCER CENTER ONLY)
ALT: 36 U/L (ref 0–44)
AST: 23 U/L (ref 15–41)
Albumin: 3.6 g/dL (ref 3.5–5.0)
Alkaline Phosphatase: 115 U/L (ref 38–126)
Anion gap: 7 (ref 5–15)
BUN: 14 mg/dL (ref 6–20)
CO2: 29 mmol/L (ref 22–32)
Calcium: 9.4 mg/dL (ref 8.9–10.3)
Chloride: 101 mmol/L (ref 98–111)
Creatinine: 0.82 mg/dL (ref 0.61–1.24)
GFR, Estimated: >60 mL/min (ref >60)
Glucose, Bld: 129 mg/dL — ABNORMAL HIGH (ref 70–99)
Potassium: 4.2 mmol/L (ref 3.5–5.1)
Sodium: 137 mmol/L (ref 135–145)
Total Bilirubin: 0.6 mg/dL (ref 0.3–1.2)
Total Protein: 7.4 g/dL (ref 6.5–8.1)

## 2020-03-01 LAB — CBC WITH DIFFERENTIAL (CANCER CENTER ONLY)
Abs Immature Granulocytes: 0.13 10*3/uL — ABNORMAL HIGH (ref 0.00–0.07)
Basophils Absolute: 0.1 10*3/uL (ref 0.0–0.1)
Basophils Relative: 1 %
Eosinophils Absolute: 0.2 10*3/uL (ref 0.0–0.5)
Eosinophils Relative: 3 %
HCT: 37.4 % — ABNORMAL LOW (ref 39.0–52.0)
Hemoglobin: 12.6 g/dL — ABNORMAL LOW (ref 13.0–17.0)
Immature Granulocytes: 2 %
Lymphocytes Relative: 20 %
Lymphs Abs: 1.4 10*3/uL (ref 0.7–4.0)
MCH: 30.4 pg (ref 26.0–34.0)
MCHC: 33.7 g/dL (ref 30.0–36.0)
MCV: 90.1 fL (ref 80.0–100.0)
Monocytes Absolute: 0.6 10*3/uL (ref 0.1–1.0)
Monocytes Relative: 8 %
Neutro Abs: 4.6 10*3/uL (ref 1.7–7.7)
Neutrophils Relative %: 66 %
Platelet Count: 179 10*3/uL (ref 150–400)
RBC: 4.15 MIL/uL — ABNORMAL LOW (ref 4.22–5.81)
RDW: 14.5 % (ref 11.5–15.5)
WBC Count: 7 10*3/uL (ref 4.0–10.5)
nRBC: 0 % (ref 0.0–0.2)

## 2020-03-01 LAB — LACTATE DEHYDROGENASE: LDH: 330 U/L — ABNORMAL HIGH (ref 98–192)

## 2020-03-01 LAB — URIC ACID: Uric Acid, Serum: 2.9 mg/dL — ABNORMAL LOW (ref 3.7–8.6)

## 2020-03-01 MED ORDER — HEPARIN SOD (PORK) LOCK FLUSH 100 UNIT/ML IV SOLN
500.0000 [IU] | Freq: Once | INTRAVENOUS | Status: AC
  Administered 2020-03-01: 500 [IU]
  Filled 2020-03-01: qty 5

## 2020-03-01 MED ORDER — PANTOPRAZOLE SODIUM 40 MG PO TBEC: 40.0000 mg | DELAYED_RELEASE_TABLET | Freq: Every day | ORAL | 3 refills | Status: DC

## 2020-03-01 MED ORDER — SODIUM CHLORIDE 0.9% FLUSH
10.0000 mL | Freq: Once | INTRAVENOUS | Status: AC
  Administered 2020-03-01: 10 mL
  Filled 2020-03-01: qty 10

## 2020-03-01 MED FILL — PANTOPRAZOLE SOD DR 40 MG T: 40 | 30 days supply | Qty: 30 | Fill #0

## 2020-03-01 NOTE — Progress Notes (Signed)
Met with patient in hallway to discuss his concerns while Lenise was busy.  Patient needed assistance with completing his Medicaid which application was started on hospital side. He had been working with Ana(Financial Navigator) whom needed additional information from provider.  Staff message sent to Dr.Dorsey with contact information for [facility].  Lenise advised.

## 2020-03-08 ENCOUNTER — Other Ambulatory Visit: Payer: Self-pay

## 2020-03-08 ENCOUNTER — Inpatient Hospital Stay: Payer: Self-pay

## 2020-03-08 DIAGNOSIS — Z95828 Presence of other vascular implants and grafts: Secondary | ICD-10-CM

## 2020-03-08 DIAGNOSIS — C8460 Anaplastic large cell lymphoma, ALK-positive, unspecified site: Secondary | ICD-10-CM

## 2020-03-08 LAB — CMP (CANCER CENTER ONLY)
ALT: 21 U/L (ref 0–44)
AST: 16 U/L (ref 15–41)
Albumin: 3.3 g/dL — ABNORMAL LOW (ref 3.5–5.0)
Alkaline Phosphatase: 116 U/L (ref 38–126)
Anion gap: 6 (ref 5–15)
BUN: 12 mg/dL (ref 6–20)
CO2: 26 mmol/L (ref 22–32)
Calcium: 8.7 mg/dL — ABNORMAL LOW (ref 8.9–10.3)
Chloride: 106 mmol/L (ref 98–111)
Creatinine: 0.91 mg/dL (ref 0.61–1.24)
GFR, Estimated: >60 mL/min (ref >60)
Glucose, Bld: 120 mg/dL — ABNORMAL HIGH (ref 70–99)
Potassium: 4.5 mmol/L (ref 3.5–5.1)
Sodium: 138 mmol/L (ref 135–145)
Total Bilirubin: <0.2 mg/dL — ABNORMAL LOW (ref 0.3–1.2)
Total Protein: 6.8 g/dL (ref 6.5–8.1)

## 2020-03-08 LAB — CBC WITH DIFFERENTIAL (CANCER CENTER ONLY)
Abs Immature Granulocytes: 0.27 10*3/uL — ABNORMAL HIGH (ref 0.00–0.07)
Basophils Absolute: 0.1 10*3/uL (ref 0.0–0.1)
Basophils Relative: 1 %
Eosinophils Absolute: 0.2 10*3/uL (ref 0.0–0.5)
Eosinophils Relative: 1 %
HCT: 35.3 % — ABNORMAL LOW (ref 39.0–52.0)
Hemoglobin: 11.6 g/dL — ABNORMAL LOW (ref 13.0–17.0)
Immature Granulocytes: 2 %
Lymphocytes Relative: 16 %
Lymphs Abs: 2.4 10*3/uL (ref 0.7–4.0)
MCH: 30.3 pg (ref 26.0–34.0)
MCHC: 32.9 g/dL (ref 30.0–36.0)
MCV: 92.2 fL (ref 80.0–100.0)
Monocytes Absolute: 1 10*3/uL (ref 0.1–1.0)
Monocytes Relative: 6 %
Neutro Abs: 11.2 10*3/uL — ABNORMAL HIGH (ref 1.7–7.7)
Neutrophils Relative %: 74 %
Platelet Count: 209 10*3/uL (ref 150–400)
RBC: 3.83 MIL/uL — ABNORMAL LOW (ref 4.22–5.81)
RDW: 15.3 % (ref 11.5–15.5)
WBC Count: 15.1 10*3/uL — ABNORMAL HIGH (ref 4.0–10.5)
nRBC: 0.3 % — ABNORMAL HIGH (ref 0.0–0.2)

## 2020-03-08 LAB — LACTATE DEHYDROGENASE: LDH: 382 U/L — ABNORMAL HIGH (ref 98–192)

## 2020-03-08 LAB — URIC ACID: Uric Acid, Serum: 3.4 mg/dL — ABNORMAL LOW (ref 3.7–8.6)

## 2020-03-08 MED ORDER — SODIUM CHLORIDE 0.9% FLUSH
10.0000 mL | Freq: Once | INTRAVENOUS | Status: AC
  Administered 2020-03-08: 10 mL
  Filled 2020-03-08: qty 10

## 2020-03-08 MED ORDER — HEPARIN SOD (PORK) LOCK FLUSH 100 UNIT/ML IV SOLN
500.0000 [IU] | Freq: Once | INTRAVENOUS | Status: AC
  Administered 2020-03-08: 500 [IU]
  Filled 2020-03-08: qty 5

## 2020-03-15 ENCOUNTER — Other Ambulatory Visit: Payer: Self-pay

## 2020-03-15 ENCOUNTER — Inpatient Hospital Stay: Payer: Self-pay

## 2020-03-15 ENCOUNTER — Other Ambulatory Visit: Payer: Self-pay | Admitting: *Deleted

## 2020-03-15 ENCOUNTER — Other Ambulatory Visit: Payer: Self-pay | Admitting: Hematology and Oncology

## 2020-03-15 ENCOUNTER — Inpatient Hospital Stay (HOSPITAL_BASED_OUTPATIENT_CLINIC_OR_DEPARTMENT_OTHER): Payer: Self-pay | Admitting: Hematology and Oncology

## 2020-03-15 ENCOUNTER — Encounter: Payer: Self-pay | Admitting: Hematology and Oncology

## 2020-03-15 VITALS — BP 98/76 | HR 89 | Temp 98.6°F | Resp 14 | Ht 68.0 in | Wt 133.5 lb

## 2020-03-15 DIAGNOSIS — C8460 Anaplastic large cell lymphoma, ALK-positive, unspecified site: Secondary | ICD-10-CM

## 2020-03-15 DIAGNOSIS — C8442 Peripheral T-cell lymphoma, not classified, intrathoracic lymph nodes: Secondary | ICD-10-CM

## 2020-03-15 DIAGNOSIS — R590 Localized enlarged lymph nodes: Secondary | ICD-10-CM

## 2020-03-15 LAB — CMP (CANCER CENTER ONLY)
ALT: 21 U/L (ref 0–44)
AST: 21 U/L (ref 15–41)
Albumin: 3.3 g/dL — ABNORMAL LOW (ref 3.5–5.0)
Alkaline Phosphatase: 74 U/L (ref 38–126)
Anion gap: 7 (ref 5–15)
BUN: 15 mg/dL (ref 6–20)
CO2: 23 mmol/L (ref 22–32)
Calcium: 8.9 mg/dL (ref 8.9–10.3)
Chloride: 104 mmol/L (ref 98–111)
Creatinine: 0.98 mg/dL (ref 0.61–1.24)
GFR, Estimated: >60 mL/min (ref >60)
Glucose, Bld: 115 mg/dL — ABNORMAL HIGH (ref 70–99)
Potassium: 3.8 mmol/L (ref 3.5–5.1)
Sodium: 134 mmol/L — ABNORMAL LOW (ref 135–145)
Total Bilirubin: 0.3 mg/dL (ref 0.3–1.2)
Total Protein: 7.1 g/dL (ref 6.5–8.1)

## 2020-03-15 LAB — CBC WITH DIFFERENTIAL (CANCER CENTER ONLY)
Abs Immature Granulocytes: 0.01 10*3/uL (ref 0.00–0.07)
Basophils Absolute: 0 10*3/uL (ref 0.0–0.1)
Basophils Relative: 1 %
Eosinophils Absolute: 0.1 10*3/uL (ref 0.0–0.5)
Eosinophils Relative: 1 %
HCT: 35.4 % — ABNORMAL LOW (ref 39.0–52.0)
Hemoglobin: 12.1 g/dL — ABNORMAL LOW (ref 13.0–17.0)
Immature Granulocytes: 0 %
Lymphocytes Relative: 35 %
Lymphs Abs: 1.8 10*3/uL (ref 0.7–4.0)
MCH: 30.5 pg (ref 26.0–34.0)
MCHC: 34.2 g/dL (ref 30.0–36.0)
MCV: 89.2 fL (ref 80.0–100.0)
Monocytes Absolute: 1.1 10*3/uL — ABNORMAL HIGH (ref 0.1–1.0)
Monocytes Relative: 20 %
Neutro Abs: 2.2 10*3/uL (ref 1.7–7.7)
Neutrophils Relative %: 43 %
Platelet Count: 304 10*3/uL (ref 150–400)
RBC: 3.97 MIL/uL — ABNORMAL LOW (ref 4.22–5.81)
RDW: 14.6 % (ref 11.5–15.5)
WBC Count: 5.2 10*3/uL (ref 4.0–10.5)
nRBC: 0 % (ref 0.0–0.2)

## 2020-03-15 LAB — URIC ACID: Uric Acid, Serum: 4.4 mg/dL (ref 3.7–8.6)

## 2020-03-15 LAB — LACTATE DEHYDROGENASE: LDH: 169 U/L (ref 98–192)

## 2020-03-15 MED ORDER — SODIUM CHLORIDE 0.9 % IV SOLN
750.0000 mg/m2 | Freq: Once | INTRAVENOUS | Status: AC
  Administered 2020-03-15: 1320 mg via INTRAVENOUS
  Filled 2020-03-15: qty 66

## 2020-03-15 MED ORDER — SODIUM CHLORIDE 0.9 % IV SOLN
Freq: Once | INTRAVENOUS | Status: AC
  Filled 2020-03-15: qty 250

## 2020-03-15 MED ORDER — ACETAMINOPHEN 325 MG PO TABS
ORAL_TABLET | ORAL | Status: AC
  Filled 2020-03-15: qty 2

## 2020-03-15 MED ORDER — SODIUM CHLORIDE 0.9 % IV SOLN
1.8000 mg/kg | Freq: Once | INTRAVENOUS | Status: AC
  Administered 2020-03-15: 115 mg via INTRAVENOUS
  Filled 2020-03-15: qty 23

## 2020-03-15 MED ORDER — DOXORUBICIN HCL CHEMO IV INJECTION 2 MG/ML
50.0000 mg/m2 | Freq: Once | INTRAVENOUS | Status: AC
  Administered 2020-03-15: 88 mg via INTRAVENOUS
  Filled 2020-03-15: qty 44

## 2020-03-15 MED ORDER — PREDNISONE 20 MG PO TABS: 100.0000 mg | ORAL_TABLET | Freq: Every day | ORAL | 3 refills | Status: DC

## 2020-03-15 MED ORDER — SODIUM CHLORIDE 0.9% FLUSH
10.0000 mL | INTRAVENOUS | Status: DC | PRN
  Administered 2020-03-15: 10 mL
  Filled 2020-03-15: qty 10

## 2020-03-15 MED ORDER — PALONOSETRON HCL INJECTION 0.25 MG/5ML
INTRAVENOUS | Status: AC
  Filled 2020-03-15: qty 5

## 2020-03-15 MED ORDER — PALONOSETRON HCL INJECTION 0.25 MG/5ML
0.2500 mg | Freq: Once | INTRAVENOUS | Status: AC
  Administered 2020-03-15: 0.25 mg via INTRAVENOUS

## 2020-03-15 MED ORDER — ACETAMINOPHEN 325 MG PO TABS
650.0000 mg | ORAL_TABLET | Freq: Once | ORAL | Status: AC
  Administered 2020-03-15: 650 mg via ORAL

## 2020-03-15 MED ORDER — DIPHENHYDRAMINE HCL 50 MG/ML IJ SOLN
50.0000 mg | Freq: Once | INTRAMUSCULAR | Status: AC
  Administered 2020-03-15: 50 mg via INTRAVENOUS

## 2020-03-15 MED ORDER — SODIUM CHLORIDE 0.9 % IV SOLN
10.0000 mg | Freq: Once | INTRAVENOUS | Status: AC
  Administered 2020-03-15: 10 mg via INTRAVENOUS
  Filled 2020-03-15: qty 10

## 2020-03-15 MED ORDER — SODIUM CHLORIDE 0.9 % IV SOLN
150.0000 mg | Freq: Once | INTRAVENOUS | Status: AC
  Administered 2020-03-15: 150 mg via INTRAVENOUS
  Filled 2020-03-15: qty 150

## 2020-03-15 MED ORDER — HEPARIN SOD (PORK) LOCK FLUSH 100 UNIT/ML IV SOLN
500.0000 [IU] | Freq: Once | INTRAVENOUS | Status: AC | PRN
  Administered 2020-03-15: 500 [IU]
  Filled 2020-03-15: qty 5

## 2020-03-15 MED ORDER — DIPHENHYDRAMINE HCL 50 MG/ML IJ SOLN
INTRAMUSCULAR | Status: AC
  Filled 2020-03-15: qty 1

## 2020-03-15 MED FILL — predniSONE 20 MG TABS: 20 | 20 days supply | Qty: 100 | Fill #0

## 2020-03-15 NOTE — Progress Notes (Signed)
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 Previously seen in June 2021 and given course of doxycycline. Infectious workup including RPR, HIV, GC/chlamydia were negative.  2) 09/23/2019: presented to PCP with concern for bilaterally enlarged lymph nodes of the groin. Noted to have scant drainage 3) 10/12/2019: establish care with Dr. 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  5) 02/23/2020: Cycle 1 Day 1 of B-CHP 6) 03/15/2020: Cycle 2 Day 1 of B-CHP  Interval History:  Ramiel Forti 55 y.o. male with medical history significant for Anaplastic T Cell Lymphoma who presents for a follow up visit. The patient's last visit was on 02/23/2020. In the interim since the last visit the patient he has completed Cycle 1 Day 1.  On exam today Mr. Heffern notes he tolerated his first cycle of chemotherapy quite well. He reports he did not have any nausea, vomiting, or diarrhea until early this week when on Monday and Tuesday he got sick and had some issues with diarrhea and an episode of vomiting. He does not believe that the symptoms he experienced were related to his treatment as they occurred almost 2 weeks after treatment. He reports that he was doing a lot of work going up and down a ladder and having some lightheadedness and dizziness when this occurred. He has unfortunately decreased about 7 pounds in the interim since January. He also reports that he had an abscess open on his backside which is subsequently drained and is no longer causing any issues. He reports that he is a chronic history of this and he feels that it is resolved and no longer draining. He  declines an exam of this area today. Otherwise he denies any fevers, chills, sweats, nausea, vomiting or diarrhea. A full 10 point ROS is listed below.  MEDICAL HISTORY:  Past Medical History:  Diagnosis Date  . Cancer (HCC)    lymph nodes  . Chronic lower back pain 15 years  . Medical history non-contributory   . Tooth pain    no abscess or drainage per pt 02-01-2020    SURGICAL HISTORY: Past Surgical History:  Procedure Laterality Date  . INCISION AND DRAINAGE PERIRECTAL ABSCESS  06/16/2017  . INCISION AND DRAINAGE PERIRECTAL ABSCESS N/A 06/16/2017   Procedure: IRRIGATION AND DEBRIDEMENT PERIRECTAL ABSCESS;  Surgeon: Coralie Keens, MD;  Location: [place];  Service: General;  Laterality: N/A;  . INGUINAL HERNIA REPAIR Right 2000s  . IR IMAGING GUIDED PORT INSERTION  02/15/2020  . LYMPH NODE BIOPSY Right 02/02/2020   Procedure: EXCISIONAL INGUINAL LYMPH NODE BIOPSY;  Surgeon: Leighton Ruff, MD;  Location: [place];  Service: General;  Laterality: Right;  . PROCTOSCOPY  06/16/2017   Procedure: RIGID PROCTOSCOPY;  Surgeon: Coralie Keens, MD;  Location: [facility] OR;  Service: General;;    SOCIAL HISTORY: Social History   Socioeconomic History  . Marital status: Married    Spouse name: Not on file  . Number of children: Not on file  . Years of education: Not on file  . Highest education level: Not on file  Occupational History  . Not on file  Tobacco Use  . Smoking status: Former Smoker    Packs/day:  0.12    Years: 37.00    Pack years: 4.44    Types: Cigarettes    Quit date: 05/20/2017    Years since quitting: 2.8  . Smokeless tobacco: Never Used  Vaping Use  . Vaping Use: Never used  Substance and Sexual Activity  . Alcohol use: Not Currently    Comment: rarew  . Drug use: Yes    Types: Marijuana    Comment: 02-01-2020 "once a day"  . Sexual activity: Yes  Other Topics Concern  . Not on file  Social History Narrative  . Not on file   Social  Determinants of Health   Financial Resource Strain: Not on file  Food Insecurity: Not on file  Transportation Needs: Not on file  Physical Activity: Not on file  Stress: Not on file  Social Connections: Not on file  Intimate Partner Violence: Not on file    FAMILY HISTORY: Family History  Problem Relation Age of Onset  . Diabetes Neg Hx     ALLERGIES:  has No Known Allergies.  MEDICATIONS:  Current Outpatient Medications  Medication Sig Dispense Refill  . pantoprazole (PROTONIX) 40 MG tablet Take 1 tablet (40 mg total) by mouth daily. 30 tablet 3  . acetaminophen (TYLENOL) 500 MG tablet Take 1,000 mg by mouth every 6 (six) hours as needed.    Marland Kitchen allopurinol (ZYLOPRIM) 300 MG tablet Take 1 tablet (300 mg total) by mouth daily. 30 tablet 3  . ibuprofen (ADVIL) 200 MG tablet Take 200 mg by mouth every 6 (six) hours as needed. Prn for tooth pain    . lidocaine-prilocaine (EMLA) cream Apply 1 application topically as needed. 30 g 1  . ondansetron (ZOFRAN) 8 MG tablet Take 1 tablet (8 mg total) by mouth every 8 (eight) hours as needed for nausea or vomiting. 20 tablet 2  . predniSONE (DELTASONE) 20 MG tablet Take 5 tablets (100 mg total) by mouth daily with breakfast. Take for 5 days, starting on Day 1 of your chemotherapy 100 tablet 3  . prochlorperazine (COMPAZINE) 10 MG tablet Take 1 tablet (10 mg total) by mouth every 6 (six) hours as needed for nausea or vomiting. 30 tablet 3   No current facility-administered medications for this visit.   Facility-Administered Medications Ordered in Other Visits  Medication Dose Route Frequency Provider Last Rate Last Admin  . sodium chloride flush (NS) 0.9 % injection 10 mL  10 mL Intracatheter PRN Orson Slick, MD   10 mL at 03/15/20 1440    REVIEW OF SYSTEMS:   Constitutional: ( - ) fevers, ( - )  chills , ( - ) night sweats Eyes: ( - ) blurriness of vision, ( - ) double vision, ( - ) watery eyes Ears, nose, mouth, throat, and face: (  - ) mucositis, ( - ) sore throat Respiratory: ( - ) cough, ( - ) dyspnea, ( - ) wheezes Cardiovascular: ( - ) palpitation, ( - ) chest discomfort, ( - ) lower extremity swelling Gastrointestinal:  ( - ) nausea, ( - ) heartburn, ( - ) change in bowel habits Skin: ( - ) abnormal skin rashes Lymphatics: ( - ) new lymphadenopathy, ( - ) easy bruising Neurological: ( - ) numbness, ( - ) tingling, ( - ) new weaknesses Behavioral/Psych: ( - ) mood change, ( - ) new changes  All other systems were reviewed with the patient and are negative.  PHYSICAL EXAMINATION: ECOG PERFORMANCE STATUS: 0 - Asymptomatic  Vitals:  03/15/20 1019  BP: 98/76  Pulse: 89  Resp: 14  Temp: 98.6 F (37 C)  SpO2: 100%   Filed Weights   03/15/20 1019  Weight: 133 lb 8 oz (60.6 kg)    GENERAL: well appearing middle aged [place] American male. alert, no distress and comfortable SKIN: skin color, texture, turgor are normal, no rashes or significant lesions EYES: conjunctiva are pink and non-injected, sclera clear LYMPH:  Decreased size of inguinal lymph nodes. Still palpable.  LUNGS: clear to auscultation and percussion with normal breathing effort HEART: regular rate & rhythm and no murmurs and no lower extremity edema Musculoskeletal: no cyanosis of digits and no clubbing  PSYCH: alert & oriented x 3, fluent speech NEURO: no focal motor/sensory deficits  LABORATORY DATA:  I have reviewed the data as listed CBC Latest Ref Rng & Units 03/15/2020 03/08/2020 03/01/2020  WBC 4.0 - 10.5 K/uL 5.2 15.1(H) 7.0  Hemoglobin 13.0 - 17.0 g/dL 12.1(L) 11.6(L) 12.6(L)  Hematocrit 39.0 - 52.0 % 35.4(L) 35.3(L) 37.4(L)  Platelets 150 - 400 K/uL 304 209 179    CMP Latest Ref Rng & Units 03/15/2020 03/08/2020 03/01/2020  Glucose 70 - 99 mg/dL 115(H) 120(H) 129(H)  BUN 6 - 20 mg/dL $Remove'15 12 14  'BpfRjQR$ Creatinine 0.61 - 1.24 mg/dL 0.98 0.91 0.82  Sodium 135 - 145 mmol/L 134(L) 138 137  Potassium 3.5 - 5.1 mmol/L 3.8 4.5 4.2  Chloride  98 - 111 mmol/L 104 106 101  CO2 22 - 32 mmol/L $RemoveB'23 26 29  'QyENkeYi$ Calcium 8.9 - 10.3 mg/dL 8.9 8.7(L) 9.4  Total Protein 6.5 - 8.1 g/dL 7.1 6.8 7.4  Total Bilirubin 0.3 - 1.2 mg/dL 0.3 <0.2(L) 0.6  Alkaline Phos 38 - 126 U/L 74 116 115  AST 15 - 41 U/L $Remo'21 16 23  'TgvRo$ ALT 0 - 44 U/L 21 21 36    RADIOGRAPHIC STUDIES: NM PET Image Initial (PI) Skull Base To Thigh  Result Date: 02/24/2020 CLINICAL DATA:  Initial treatment strategy for T-cell and null cell lymphoma. Chemotherapy yesterday. EXAM: NUCLEAR MEDICINE PET SKULL BASE TO THIGH TECHNIQUE: 6.7 mCi F-18 FDG was injected intravenously. Full-ring PET imaging was performed from the skull base to thigh after the radiotracer. CT data was obtained and used for attenuation correction and anatomic localization. Fasting blood glucose: 101 mg/dl COMPARISON:  10/21/2019 CT abdomen/pelvis. FINDINGS: Mediastinal blood pool activity: SUV max 1.9 Liver activity: SUV max 2.6 NECK: No hypermetabolic lymph nodes in the neck. Incidental CT findings: Right internal jugular Port-A-Cath terminates at the cavoatrial junction. CHEST: Enlarged hypermetabolic bilateral axillary lymph nodes. Representative 1.2 cm short axis diameter left axillary node with max SUV 6.8 (series 4/image 47). Representative 1.4 cm right axillary node with max SUV 3.0 (series 4/image 50). No enlarged or hypermetabolic mediastinal or hilar nodes. No hypermetabolic pulmonary findings. Tiny 0.5 cm cutaneous focus of hypermetabolism in the lateral right chest wall with max SUV 2.5 (series 4/image 85). Small 1.0 cm cutaneous focus of hypermetabolism in the low right thoracic back with max SUV 4.9 (series 4/image 118). Incidental CT findings: Coronary atherosclerosis. A few scattered small solid pulmonary nodules in the lungs bilaterally, largest 4 mm in the right middle lobe (series 8/image 45). ABDOMEN/PELVIS: No abnormal hypermetabolic activity within the liver, pancreas, adrenal glands, or spleen. No hypermetabolic  lymph nodes in the abdomen. Enlarged hypermetabolic bilateral inguinal lymph nodes. Representative 2.2 cm left inguinal node with max SUV 6.3 (series 4/image 187). Representative 2.2 cm right inguinal node with max SUV 6.9 (series  4/image 188). Nonspecific circumferential hypermetabolism at the anorectal junction with max SUV 6.9, with hypermetabolism extending into the levator musculature bilaterally, right greater than left with max SUV 8.3 on the right associated with a vague low-attenuation 1.9 x 1.4 cm collection (series 4/image 183). Incidental CT findings: Mildly atherosclerotic nonaneurysmal abdominal aorta. SKELETON: Small hypermetabolic right sternal lesion with max SUV 3.6, CT occult. No additional hypermetabolic skeletal lesions. Incidental CT findings: none IMPRESSION: 1. Hypermetabolic bilateral axillary and bilateral inguinal lymphadenopathy, compatible with lymphoma. Deauville category for activity. 2. Solitary small hypermetabolic right sternal lesion, suspicious for skeletal involvement by lymphoma. 3. Two small cutaneous foci of hypermetabolism in the right chest wall, cannot exclude cutaneous involvement by lymphoma. 4. Nonspecific circumferential hypermetabolism at the anorectal junction extending into the right greater the left levator musculature, indeterminate for inflammatory (such as anal fistula related) versus neoplastic activity. Anal fistula protocol MRI pelvis without and with IV contrast could be obtained for further characterization as clinically warranted. 5. Small scattered bilateral solid pulmonary nodules, largest 4 mm, below PET resolution. Recommend attention on follow-up chest CT in 3-6 months. 6. Aortic Atherosclerosis (ICD10-I70.0). Electronically Signed   By: Delbert Phenix M.D.   On: 02/24/2020 09:40   ECHOCARDIOGRAM COMPLETE  Result Date: 02/24/2020    ECHOCARDIOGRAM REPORT   Patient Name:   ANDIE MORTIMER Date of Exam: 02/23/2020 Medical Rec #:  [id number]   Height:        68.0 in Accession #:    [id number]  Weight:       140.0 lb Date of Birth:  03-31-1965   BSA:          1.756 m Patient Age:    54 years    BP:           117/83 mmHg Patient Gender: M           HR:           72 bpm. Exam Location:  Outpatient Procedure: 2D Echo, Cardiac Doppler, Color Doppler and Strain Analysis Indications:    Chemo Z09  History:        Patient has no prior history of Echocardiogram examinations.                 Risk Factors:Former Smoker.  Sonographer:    Renella Cunas RDCS Referring Phys: [id number] Jackquline Denmark DORSEY IV IMPRESSIONS  1. Left ventricular ejection fraction, by estimation, is 55 to 60%. The left ventricle has normal function. The left ventricle has no regional wall motion abnormalities. Left ventricular diastolic parameters were normal. The average left ventricular global longitudinal strain is -21.4 %. The global longitudinal strain is normal.  2. Right ventricular systolic function is normal. The right ventricular size is normal. There is normal pulmonary artery systolic pressure.  3. Redundant mitral valve chords that are very mobile and demonstrate systolic anterior motion into the LVOT. No obstruction noted, unlikely to be of clinical significance given normal mitral valve function. . The mitral valve is grossly normal. Trivial  mitral valve regurgitation. No evidence of mitral stenosis.  4. The aortic valve is tricuspid. Aortic valve regurgitation is not visualized. No aortic stenosis is present.  5. Borderline aortic dilatation noted. There is borderline dilatation of the aortic root, measuring 38 mm.  6. The inferior vena cava is normal in size with greater than 50% respiratory variability, suggesting right atrial pressure of 3 mmHg. FINDINGS  Left Ventricle: Left ventricular ejection fraction, by estimation, is 55 to 60%. The left ventricle  has normal function. The left ventricle has no regional wall motion abnormalities. The average left ventricular global longitudinal strain is -21.4  %. The global longitudinal strain is normal. The left ventricular internal cavity size was normal in size. There is no left ventricular hypertrophy. Left ventricular diastolic parameters were normal. Right Ventricle: The right ventricular size is normal. No increase in right ventricular wall thickness. Right ventricular systolic function is normal. There is normal pulmonary artery systolic pressure. The tricuspid regurgitant velocity is 1.53 m/s, and  with an assumed right atrial pressure of 3 mmHg, the estimated right ventricular systolic pressure is 47.4 mmHg. Left Atrium: Left atrial size was normal in size. Right Atrium: Right atrial size was normal in size. Pericardium: Trivial pericardial effusion is present. Mitral Valve: Redundant mitral valve chords that are very mobile and demonstrate systolic anterior motion into the LVOT. No obstruction noted, unlikely to be of clinical significance given normal mitral valve function. The mitral valve is grossly normal.  Trivial mitral valve regurgitation. No evidence of mitral valve stenosis. Tricuspid Valve: The tricuspid valve is normal in structure. Tricuspid valve regurgitation is trivial. No evidence of tricuspid stenosis. Aortic Valve: The aortic valve is tricuspid. Aortic valve regurgitation is not visualized. No aortic stenosis is present. Pulmonic Valve: The pulmonic valve was normal in structure. Pulmonic valve regurgitation is trivial. No evidence of pulmonic stenosis. Aorta: Aortic dilatation noted. There is borderline dilatation of the aortic root, measuring 38 mm. Venous: The inferior vena cava is normal in size with greater than 50% respiratory variability, suggesting right atrial pressure of 3 mmHg. IAS/Shunts: No atrial level shunt detected by color flow Doppler.  LEFT VENTRICLE PLAX 2D LVIDd:         5.20 cm      Diastology LVIDs:         3.50 cm      LV e' medial:    7.62 cm/s LV PW:         0.70 cm      LV E/e' medial:  6.1 LV IVS:        0.70 cm       LV e' lateral:   10.20 cm/s LVOT diam:     2.50 cm      LV E/e' lateral: 4.6 LV SV:         78 LV SV Index:   44           2D Longitudinal Strain LVOT Area:     4.91 cm     2D Strain GLS Avg:     -21.4 %  LV Volumes (MOD) LV vol d, MOD A2C: 141.0 ml LV vol d, MOD A4C: 131.0 ml LV vol s, MOD A2C: 62.3 ml LV vol s, MOD A4C: 62.0 ml LV SV MOD A2C:     78.7 ml LV SV MOD A4C:     131.0 ml LV SV MOD BP:      73.7 ml RIGHT VENTRICLE RV S prime:     15.00 cm/s TAPSE (M-mode): 2.3 cm LEFT ATRIUM           Index       RIGHT ATRIUM           Index LA diam:      2.50 cm 1.42 cm/m  RA Area:     11.20 cm LA Vol (A2C): 25.3 ml 14.41 ml/m RA Volume:   26.20 ml  14.92 ml/m LA Vol (A4C): 17.0 ml 9.68 ml/m  AORTIC VALVE LVOT  Vmax:   77.20 cm/s LVOT Vmean:  50.400 cm/s LVOT VTI:    0.159 m  AORTA Ao Root diam: 3.80 cm Ao Asc diam:  3.40 cm MITRAL VALVE               TRICUSPID VALVE MV Area (PHT): 2.56 cm    TR Peak grad:   9.4 mmHg MV Decel Time: 296 msec    TR Vmax:        153.00 cm/s MR Peak grad: 42.2 mmHg MR Vmax:      325.00 cm/s  SHUNTS MV E velocity: 46.50 cm/s  Systemic VTI:  0.16 m MV A velocity: 45.00 cm/s  Systemic Diam: 2.50 cm MV E/A ratio:  1.03 Weston Brass MD Electronically signed by Weston Brass MD Signature Date/Time: 02/24/2020/6:52:56 AM    Final    IR IMAGING GUIDED PORT INSERTION  Result Date: 02/15/2020 CLINICAL DATA:  Lymphoma, access for chemotherapy EXAM: RIGHT INTERNAL JUGULAR SINGLE LUMEN POWER PORT CATHETER INSERTION Date:  02/15/2020 02/15/2020 10:03 am Radiologist:  M. Ruel Favors, MD Guidance:  Ultrasound and fluoroscopic MEDICATIONS: Ancef 2 g; The antibiotic was administered within an appropriate time interval prior to skin puncture. ANESTHESIA/SEDATION: Versed 4.0 mg IV; Fentanyl 100 mcg IV; Moderate Sedation Time:  30 minutes The patient was continuously monitored during the procedure by the interventional radiology nurse under my direct supervision. FLUOROSCOPY TIME:  One minutes, 18  seconds (7 mGy) COMPLICATIONS: None immediate. CONTRAST:  None. PROCEDURE: Informed consent was obtained from the patient following explanation of the procedure, risks, benefits and alternatives. The patient understands, agrees and consents for the procedure. All questions were addressed. A time out was performed. Maximal barrier sterile technique utilized including caps, mask, sterile gowns, sterile gloves, large sterile drape, hand hygiene, and 2% chlorhexidine scrub. Under sterile conditions and local anesthesia, right internal jugular micropuncture venous access was performed. Access was performed with ultrasound. Images were obtained for documentation of the patent right internal jugular vein. A guide wire was inserted followed by a transitional dilator. This allowed insertion of a guide wire and catheter into the IVC. Measurements were obtained from the SVC / RA junction back to the right IJ venotomy site. In the right infraclavicular chest, a subcutaneous pocket was created over the second anterior rib. This was done under sterile conditions and local anesthesia. 1% lidocaine with epinephrine was utilized for this. A 2.5 cm incision was made in the skin. Blunt dissection was performed to create a subcutaneous pocket over the right pectoralis major muscle. The pocket was flushed with saline vigorously. There was adequate hemostasis. The port catheter was assembled and checked for leakage. The port catheter was secured in the pocket with two retention sutures. The tubing was tunneled subcutaneously to the right venotomy site and inserted into the SVC/RA junction through a valved peel-away sheath. Position was confirmed with fluoroscopy. Images were obtained for documentation. The patient tolerated the procedure well. No immediate complications. Incisions were closed in a two layer fashion with 4 - 0 Vicryl suture. Dermabond was applied to the skin. The port catheter was accessed, blood was aspirated followed by  saline and heparin flushes. Needle was removed. A dry sterile dressing was applied. IMPRESSION: Ultrasound and fluoroscopically guided right internal jugular single lumen power port catheter insertion. Tip in the SVC/RA junction. Catheter ready for use. Electronically Signed   By: Judie Petit.  Shick M.D.   On: 02/15/2020 10:19    ASSESSMENT & PLAN Jaxon Flatt 55 y.o. male with no significant  medical history presents for evaluation of Anaplastic T Cell Lymphoma.  Review the labs, review the records, discussion with the patient the findings are most consistent with Anaplastic T Cell Lymphoma.     PET CT scan performed on 02/24/2020 confirmed Stage III disease with involvement of the axillary and inguinal lymph nodes. He has also had a port placed and echocardiogram performed.   Previously Mr. Feighner has had the opportunity to ask any questions or concerns that he has had about his disease or chemotherapy moving forward.  He notes that he is aware of the risks and benefits of treatment and is willing and able to proceed with treatment today.  The treatment of choice for this patient's cancer would be B-CHP chemotherapy.  This regimen consists of doxorubicin 50 mg per metered squared on day 1, cyclophosphamide 750 mg per metered squared on day 1, and brentuximab 1.8 mg/kg on day 1 of a 21-day cycle.  The patient also received prednisone 100mg  PO on days 1-5.  We will proceed for 6 cycles.  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  --port had been placed, TTE shows adequate heart function for anthracycline therapy.  --PET CT scan shows involvement of the inguinal/axillary lymph nodes. Findings consistent with Stage III disease.  --plan for B-CHP chemotherapy as noted above. Cycle 1 Day 1 of treatment started on 02/23/2020. Today is Cycle 2 Day 1.  --RTC on 03/26/2020 for Cycle 3 Day 1 of treatment  #Supportive Care --chemotherapy  education to be scheduled  --zofran 8mg  q8H PRN and compazine 10mg  PO q6H for nausea -- allopurinol 300mg  PO daily for TLS prophylaxis -- EMLA cream for port -- no pain medication required at this time.   No orders of the defined types were placed in this encounter.   All questions were answered. The patient knows to call the clinic with any problems, questions or concerns.  A total of more than 30 minutes were spent on this encounter and over half of that time was spent on counseling and coordination of care as outlined above.   Ledell Peoples, MD Department of Hematology/Oncology [place] at [facility] Phone: [phone] Pager: [phone] Email: [email or web address]  03/15/2020 7:57 PM   Literature Support:  Lynden Ang OA, Pro B, [place], [place], [place], Daniel, [place]r, Morschhauser F, Domingo-Domenech E, Margot Chimes [address] D, [place] , [place], [place], [place] SP, Shustov A, Httmann A, [place], Yuen S, Iyer S, Zinzani PL, Hua Z, [place] M, Romelle Starcher T, Trmper L; [place] Study Group. 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 Lancet. 2019 Jan 19;393(10168):229-240.   --Median progression-free survival was 482 months (95% CI 352-not evaluable) in the A+CHP group and 208 months (127-476) in the CHOP group (hazard ratio 071 [95% CI 054-093], p=[id number]).   --Front-line treatment with A+CHP is superior to CHOP for patients with CD30-positive peripheral T-cell lymphomas as shown by a significant improvement in progression-free survival and overall survival with a manageable safety profile.

## 2020-03-20 ENCOUNTER — Telehealth: Payer: Self-pay | Admitting: Hematology and Oncology

## 2020-03-20 NOTE — Telephone Encounter (Signed)
Scheduled per 02/25 los, patient has been called and notified regarding upcoming March appointments.

## 2020-03-21 ENCOUNTER — Telehealth: Payer: Self-pay | Admitting: *Deleted

## 2020-03-21 NOTE — Telephone Encounter (Signed)
Received message from on call stating pt had been having diarrhea and nausea. TCT patient and he states he has had diarrhea on Monday and Tuesday with some nausea. Vomited once. He did not take his compazine initially but took it later on and has felt better.  He did start imodium and that has slowed his diarrhea down considerably. He is drinking extra fluids and feels better overall today.  Advised to call back if these symptoms come back and he is not able to control them with his current medications.  He voiced understanding and is aware of his next appt on 04/05/20.  Dr. Lorenso Courier made aware of the above.

## 2020-03-22 ENCOUNTER — Inpatient Hospital Stay: Payer: Self-pay

## 2020-03-29 ENCOUNTER — Inpatient Hospital Stay: Payer: Self-pay

## 2020-04-04 ENCOUNTER — Encounter: Payer: Self-pay | Admitting: Hematology and Oncology

## 2020-04-04 NOTE — Progress Notes (Signed)
Called pt to introduce myself as his Arboriculturist and to discuss the [employer].  Pt would like to apply so he will bring proof of income on 04/05/20.  If approved I will give him an expense sheet and my card for any questions or concerns he may have in the future.

## 2020-04-05 ENCOUNTER — Other Ambulatory Visit: Payer: Self-pay

## 2020-04-05 ENCOUNTER — Encounter: Payer: Self-pay | Admitting: Hematology and Oncology

## 2020-04-05 ENCOUNTER — Other Ambulatory Visit: Payer: Self-pay | Admitting: Hematology and Oncology

## 2020-04-05 ENCOUNTER — Inpatient Hospital Stay: Payer: Self-pay | Attending: Hematology and Oncology

## 2020-04-05 ENCOUNTER — Inpatient Hospital Stay: Payer: Self-pay | Admitting: Nutrition

## 2020-04-05 ENCOUNTER — Inpatient Hospital Stay (HOSPITAL_BASED_OUTPATIENT_CLINIC_OR_DEPARTMENT_OTHER): Payer: Self-pay | Admitting: Hematology and Oncology

## 2020-04-05 ENCOUNTER — Inpatient Hospital Stay: Payer: Self-pay

## 2020-04-05 VITALS — BP 113/71 | HR 79 | Temp 97.6°F | Resp 15 | Ht 68.0 in | Wt 140.9 lb

## 2020-04-05 DIAGNOSIS — Z87891 Personal history of nicotine dependence: Secondary | ICD-10-CM | POA: Insufficient documentation

## 2020-04-05 DIAGNOSIS — C8460 Anaplastic large cell lymphoma, ALK-positive, unspecified site: Secondary | ICD-10-CM

## 2020-04-05 DIAGNOSIS — C8448 Peripheral T-cell lymphoma, not classified, lymph nodes of multiple sites: Secondary | ICD-10-CM | POA: Insufficient documentation

## 2020-04-05 DIAGNOSIS — Z5111 Encounter for antineoplastic chemotherapy: Secondary | ICD-10-CM | POA: Insufficient documentation

## 2020-04-05 DIAGNOSIS — Z5112 Encounter for antineoplastic immunotherapy: Secondary | ICD-10-CM | POA: Insufficient documentation

## 2020-04-05 DIAGNOSIS — Z95828 Presence of other vascular implants and grafts: Secondary | ICD-10-CM

## 2020-04-05 DIAGNOSIS — Z79899 Other long term (current) drug therapy: Secondary | ICD-10-CM | POA: Insufficient documentation

## 2020-04-05 DIAGNOSIS — C8442 Peripheral T-cell lymphoma, not classified, intrathoracic lymph nodes: Secondary | ICD-10-CM

## 2020-04-05 DIAGNOSIS — R590 Localized enlarged lymph nodes: Secondary | ICD-10-CM

## 2020-04-05 DIAGNOSIS — Z7952 Long term (current) use of systemic steroids: Secondary | ICD-10-CM | POA: Insufficient documentation

## 2020-04-05 LAB — CBC WITH DIFFERENTIAL (CANCER CENTER ONLY)
Abs Immature Granulocytes: 0.03 10*3/uL (ref 0.00–0.07)
Basophils Absolute: 0 10*3/uL (ref 0.0–0.1)
Basophils Relative: 1 %
Eosinophils Absolute: 0 10*3/uL (ref 0.0–0.5)
Eosinophils Relative: 0 %
HCT: 30.4 % — ABNORMAL LOW (ref 39.0–52.0)
Hemoglobin: 10.2 g/dL — ABNORMAL LOW (ref 13.0–17.0)
Immature Granulocytes: 1 %
Lymphocytes Relative: 16 %
Lymphs Abs: 0.8 10*3/uL (ref 0.7–4.0)
MCH: 31.9 pg (ref 26.0–34.0)
MCHC: 33.6 g/dL (ref 30.0–36.0)
MCV: 95 fL (ref 80.0–100.0)
Monocytes Absolute: 0.2 10*3/uL (ref 0.1–1.0)
Monocytes Relative: 4 %
Neutro Abs: 3.7 10*3/uL (ref 1.7–7.7)
Neutrophils Relative %: 78 %
Platelet Count: 425 10*3/uL — ABNORMAL HIGH (ref 150–400)
RBC: 3.2 MIL/uL — ABNORMAL LOW (ref 4.22–5.81)
RDW: 16.9 % — ABNORMAL HIGH (ref 11.5–15.5)
WBC Count: 4.8 10*3/uL (ref 4.0–10.5)
nRBC: 0 % (ref 0.0–0.2)

## 2020-04-05 LAB — CMP (CANCER CENTER ONLY)
ALT: 49 U/L — ABNORMAL HIGH (ref 0–44)
AST: 34 U/L (ref 15–41)
Albumin: 3.3 g/dL — ABNORMAL LOW (ref 3.5–5.0)
Alkaline Phosphatase: 55 U/L (ref 38–126)
Anion gap: 6 (ref 5–15)
BUN: 18 mg/dL (ref 6–20)
CO2: 25 mmol/L (ref 22–32)
Calcium: 8.9 mg/dL (ref 8.9–10.3)
Chloride: 107 mmol/L (ref 98–111)
Creatinine: 0.79 mg/dL (ref 0.61–1.24)
GFR, Estimated: >60 mL/min (ref >60)
Glucose, Bld: 114 mg/dL — ABNORMAL HIGH (ref 70–99)
Potassium: 4.5 mmol/L (ref 3.5–5.1)
Sodium: 138 mmol/L (ref 135–145)
Total Bilirubin: <0.2 mg/dL — ABNORMAL LOW (ref 0.3–1.2)
Total Protein: 6.9 g/dL (ref 6.5–8.1)

## 2020-04-05 LAB — LACTATE DEHYDROGENASE: LDH: 188 U/L (ref 98–192)

## 2020-04-05 LAB — URIC ACID: Uric Acid, Serum: 5.5 mg/dL (ref 3.7–8.6)

## 2020-04-05 MED ORDER — DIPHENHYDRAMINE HCL 50 MG/ML IJ SOLN
INTRAMUSCULAR | Status: AC
  Filled 2020-04-05: qty 1

## 2020-04-05 MED ORDER — DEXAMETHASONE SODIUM PHOSPHATE 100 MG/10ML IJ SOLN
10.0000 mg | Freq: Once | INTRAMUSCULAR | Status: AC
Start: 2020-04-05 — End: 2020-04-05
  Administered 2020-04-05: 10 mg via INTRAVENOUS
  Filled 2020-04-05: qty 10

## 2020-04-05 MED ORDER — SODIUM CHLORIDE 0.9 % IV SOLN
150.0000 mg | Freq: Once | INTRAVENOUS | Status: AC
  Administered 2020-04-05: 150 mg via INTRAVENOUS
  Filled 2020-04-05: qty 150

## 2020-04-05 MED ORDER — SODIUM CHLORIDE 0.9 % IV SOLN
Freq: Once | INTRAVENOUS | Status: AC
  Filled 2020-04-05: qty 250

## 2020-04-05 MED ORDER — PALONOSETRON HCL INJECTION 0.25 MG/5ML
0.2500 mg | Freq: Once | INTRAVENOUS | Status: AC
  Administered 2020-04-05: 0.25 mg via INTRAVENOUS

## 2020-04-05 MED ORDER — ALLOPURINOL 300 MG PO TABS: 300.0000 mg | ORAL_TABLET | Freq: Every day | ORAL | 3 refills | Status: DC

## 2020-04-05 MED ORDER — SODIUM CHLORIDE 0.9% FLUSH
10.0000 mL | Freq: Once | INTRAVENOUS | Status: AC
  Administered 2020-04-05: 10 mL
  Filled 2020-04-05: qty 10

## 2020-04-05 MED ORDER — DOXORUBICIN HCL CHEMO IV INJECTION 2 MG/ML
50.0000 mg/m2 | Freq: Once | INTRAVENOUS | Status: AC
  Administered 2020-04-05: 88 mg via INTRAVENOUS
  Filled 2020-04-05: qty 44

## 2020-04-05 MED ORDER — ACETAMINOPHEN 325 MG PO TABS
ORAL_TABLET | ORAL | Status: AC
  Filled 2020-04-05: qty 2

## 2020-04-05 MED ORDER — HEPARIN SOD (PORK) LOCK FLUSH 100 UNIT/ML IV SOLN
500.0000 [IU] | Freq: Once | INTRAVENOUS | Status: AC | PRN
  Administered 2020-04-05: 500 [IU]
  Filled 2020-04-05: qty 5

## 2020-04-05 MED ORDER — SODIUM CHLORIDE 0.9 % IV SOLN
750.0000 mg/m2 | Freq: Once | INTRAVENOUS | Status: AC
  Administered 2020-04-05: 1320 mg via INTRAVENOUS
  Filled 2020-04-05: qty 66

## 2020-04-05 MED ORDER — SODIUM CHLORIDE 0.9% FLUSH
10.0000 mL | INTRAVENOUS | Status: DC | PRN
Start: 2020-04-05 — End: 2020-04-05
  Administered 2020-04-05: 10 mL
  Filled 2020-04-05: qty 10

## 2020-04-05 MED ORDER — ACETAMINOPHEN 325 MG PO TABS
650.0000 mg | ORAL_TABLET | Freq: Once | ORAL | Status: AC
  Administered 2020-04-05: 650 mg via ORAL

## 2020-04-05 MED ORDER — PALONOSETRON HCL INJECTION 0.25 MG/5ML
INTRAVENOUS | Status: AC
  Filled 2020-04-05: qty 5

## 2020-04-05 MED ORDER — SODIUM CHLORIDE 0.9 % IV SOLN
1.8000 mg/kg | Freq: Once | INTRAVENOUS | Status: AC
  Administered 2020-04-05: 115 mg via INTRAVENOUS
  Filled 2020-04-05: qty 23

## 2020-04-05 MED ORDER — DIPHENHYDRAMINE HCL 50 MG/ML IJ SOLN
50.0000 mg | Freq: Once | INTRAMUSCULAR | Status: AC
  Administered 2020-04-05: 50 mg via INTRAVENOUS

## 2020-04-05 NOTE — Patient Instructions (Signed)

## 2020-04-05 NOTE — Patient Instructions (Signed)
[facility] Discharge Instructions for Patients Receiving Chemotherapy  Today you received the following chemotherapy agents Doxorubin(Adrucil), Cyclophosphomide(Cytoxan), Brentuxiumab.  To help prevent nausea and vomiting after your treatment, we encourage you to take your nausea medication as directed.   If you develop nausea and vomiting that is not controlled by your nausea medication, call the clinic.   BELOW ARE SYMPTOMS THAT SHOULD BE REPORTED IMMEDIATELY:  *FEVER GREATER THAN 100.5 F  *CHILLS WITH OR WITHOUT FEVER  NAUSEA AND VOMITING THAT IS NOT CONTROLLED WITH YOUR NAUSEA MEDICATION  *UNUSUAL SHORTNESS OF BREATH  *UNUSUAL BRUISING OR BLEEDING  TENDERNESS IN MOUTH AND THROAT WITH OR WITHOUT PRESENCE OF ULCERS  *URINARY PROBLEMS  *BOWEL PROBLEMS  UNUSUAL RASH Items with * indicate a potential emergency and should be followed up as soon as possible.  Feel free to call the clinic should you have any questions or concerns. The clinic phone number is [phone].  Please show the [facility] at check-in to the Emergency Department and triage nurse.

## 2020-04-05 NOTE — Progress Notes (Signed)
Pt is approved for the $1000 [employer] grant.  

## 2020-04-05 NOTE — Progress Notes (Signed)
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 Previously seen in June 2021 and given course of doxycycline. Infectious workup including RPR, HIV, GC/chlamydia were negative.  2) 09/23/2019: presented to PCP with concern for bilaterally enlarged lymph nodes of the groin. Noted to have scant drainage 3) 10/12/2019: establish care with Dr. 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  5) 02/23/2020: Cycle 1 Day 1 of BV-CHP 6) 03/15/2020: Cycle 2 Day 1 of BV-CHP 7) 04/05/2020: Cycle 3 Day 1 of BV-CHP  Interval History:  Tanner Gallagher 55 y.o. male with medical history significant for Anaplastic T Cell Lymphoma who presents for a follow up visit. The patient's last visit was on 03/15/2020. In the interim since the last visit the patient he has completed Cycle 2.  On exam today Tanner Gallagher notes he tolerated his second cycle of chemotherapy quite well.  He did have some diarrhea and vomiting on the Monday through Wednesday.  He thinks this may be due to the fact that he continue to work and became dehydrated.  He was taking his nausea medications which were quite effective in treating this.  He reports that he requires a refill on his allopurinol medication a day but otherwise is in good supply of his other medications.  He notes that his energy and appetite are both quite good and has been drinking protein shakes try to keep up his weight.  He reports that his lymph nodes are approximately the same in size from our prior visit.. Otherwise he denies any fevers, chills, sweats, nausea, vomiting or diarrhea. A full 10 point ROS is listed below.  MEDICAL HISTORY:   Past Medical History:  Diagnosis Date  . Cancer (HCC)    lymph nodes  . Chronic lower back pain 15 years  . Medical history non-contributory   . Tooth pain    no abscess or drainage per pt 02-01-2020    SURGICAL HISTORY: Past Surgical History:  Procedure Laterality Date  . INCISION AND DRAINAGE PERIRECTAL ABSCESS  06/16/2017  . INCISION AND DRAINAGE PERIRECTAL ABSCESS N/A 06/16/2017   Procedure: IRRIGATION AND DEBRIDEMENT PERIRECTAL ABSCESS;  Surgeon: Coralie Keens, MD;  Location: [facility];  Service: General;  Laterality: N/A;  . INGUINAL HERNIA REPAIR Right 2000s  . IR IMAGING GUIDED PORT INSERTION  02/15/2020  . LYMPH NODE BIOPSY Right 02/02/2020   Procedure: EXCISIONAL INGUINAL LYMPH NODE BIOPSY;  Surgeon: Leighton Ruff, MD;  Location: [facility];  Service: General;  Laterality: Right;  . PROCTOSCOPY  06/16/2017   Procedure: RIGID PROCTOSCOPY;  Surgeon: Coralie Keens, MD;  Location: [facility] OR;  Service: General;;    SOCIAL HISTORY: Social History   Socioeconomic History  . Marital status: Married    Spouse name: Not on file  . Number of children: Not on file  . Years of education: Not on file  . Highest education level: Not on file  Occupational History  . Not on file  Tobacco Use  . Smoking status: Former Smoker    Packs/day: 0.12    Years: 37.00    Pack years: 4.44    Types: Cigarettes    Quit date: 05/20/2017    Years since quitting:  2.8  . Smokeless tobacco: Never Used  Vaping Use  . Vaping Use: Never used  Substance and Sexual Activity  . Alcohol use: Not Currently    Comment: rarew  . Drug use: Yes    Types: Marijuana    Comment: 02-01-2020 "once a day"  . Sexual activity: Yes  Other Topics Concern  . Not on file  Social History Narrative  . Not on file   Social Determinants of Health   Financial Resource Strain: Not on file  Food Insecurity: Not on file  Transportation Needs: Not on file  Physical Activity: Not on file  Stress: Not  on file  Social Connections: Not on file  Intimate Partner Violence: Not on file    FAMILY HISTORY: Family History  Problem Relation Age of Onset  . Diabetes Neg Hx     ALLERGIES:  has No Known Allergies.  MEDICATIONS:  Current Outpatient Medications  Medication Sig Dispense Refill  . acetaminophen (TYLENOL) 500 MG tablet Take 1,000 mg by mouth every 6 (six) hours as needed.    Marland Kitchen allopurinol (ZYLOPRIM) 300 MG tablet Take 1 tablet (300 mg total) by mouth daily. 30 tablet 3  . ibuprofen (ADVIL) 200 MG tablet Take 200 mg by mouth every 6 (six) hours as needed. Prn for tooth pain    . lidocaine-prilocaine (EMLA) cream Apply 1 application topically as needed. 30 g 1  . ondansetron (ZOFRAN) 8 MG tablet Take 1 tablet (8 mg total) by mouth every 8 (eight) hours as needed for nausea or vomiting. 20 tablet 2  . pantoprazole (PROTONIX) 40 MG tablet Take 1 tablet (40 mg total) by mouth daily. 30 tablet 3  . predniSONE (DELTASONE) 20 MG tablet Take 5 tablets (100 mg total) by mouth daily with breakfast. Take for 5 days, starting on Day 1 of your chemotherapy 100 tablet 3  . prochlorperazine (COMPAZINE) 10 MG tablet Take 1 tablet (10 mg total) by mouth every 6 (six) hours as needed for nausea or vomiting. 30 tablet 3   No current facility-administered medications for this visit.    REVIEW OF SYSTEMS:   Constitutional: ( - ) fevers, ( - )  chills , ( - ) night sweats Eyes: ( - ) blurriness of vision, ( - ) double vision, ( - ) watery eyes Ears, nose, mouth, throat, and face: ( - ) mucositis, ( - ) sore throat Respiratory: ( - ) cough, ( - ) dyspnea, ( - ) wheezes Cardiovascular: ( - ) palpitation, ( - ) chest discomfort, ( - ) lower extremity swelling Gastrointestinal:  ( - ) nausea, ( - ) heartburn, ( - ) change in bowel habits Skin: ( - ) abnormal skin rashes Lymphatics: ( - ) new lymphadenopathy, ( - ) easy bruising Neurological: ( - ) numbness, ( - ) tingling, ( - ) new  weaknesses Behavioral/Psych: ( - ) mood change, ( - ) new changes  All other systems were reviewed with the patient and are negative.  PHYSICAL EXAMINATION: ECOG PERFORMANCE STATUS: 0 - Asymptomatic  Vitals:   04/05/20 1200  BP: 113/71  Pulse: 79  Resp: 15  Temp: 97.6 F (36.4 C)  SpO2: 100%   Filed Weights   04/05/20 1200  Weight: 140 lb 14.4 oz (63.9 kg)    GENERAL: well appearing middle aged [place] American male. alert, no distress and comfortable SKIN: skin color, texture, turgor are normal, no rashes or significant lesions EYES: conjunctiva are pink and non-injected, sclera clear LYMPH:  Deferred today LUNGS: clear to auscultation and percussion with normal breathing effort HEART: regular rate & rhythm and no murmurs and no lower extremity edema Musculoskeletal: no cyanosis of digits and no clubbing  PSYCH: alert & oriented x 3, fluent speech NEURO: no focal motor/sensory deficits  LABORATORY DATA:  I have reviewed the data as listed CBC Latest Ref Rng & Units 04/05/2020 03/15/2020 03/08/2020  WBC 4.0 - 10.5 K/uL 4.8 5.2 15.1(H)  Hemoglobin 13.0 - 17.0 g/dL 10.2(L) 12.1(L) 11.6(L)  Hematocrit 39.0 - 52.0 % 30.4(L) 35.4(L) 35.3(L)  Platelets 150 - 400 K/uL 425(H) 304 209    CMP Latest Ref Rng & Units 04/05/2020 03/15/2020 03/08/2020  Glucose 70 - 99 mg/dL 114(H) 115(H) 120(H)  BUN 6 - 20 mg/dL 18 15 12   Creatinine 0.61 - 1.24 mg/dL 0.79 0.98 0.91  Sodium 135 - 145 mmol/L 138 134(L) 138  Potassium 3.5 - 5.1 mmol/L 4.5 3.8 4.5  Chloride 98 - 111 mmol/L 107 104 106  CO2 22 - 32 mmol/L 25 23 26   Calcium 8.9 - 10.3 mg/dL 8.9 8.9 8.7(L)  Total Protein 6.5 - 8.1 g/dL 6.9 7.1 6.8  Total Bilirubin 0.3 - 1.2 mg/dL <0.2(L) 0.3 <0.2(L)  Alkaline Phos 38 - 126 U/L 55 74 116  AST 15 - 41 U/L 34 21 16  ALT 0 - 44 U/L 49(H) 21 21    RADIOGRAPHIC STUDIES: No results found.  ASSESSMENT & PLAN Tanner Gallagher 55 y.o. male with no significant medical history presents for evaluation  of Anaplastic T Cell Lymphoma.  Review the labs, review the records, discussion with the patient the findings are most consistent with Anaplastic T Cell Lymphoma.     PET CT scan performed on 02/24/2020 confirmed Stage III disease with involvement of the axillary and inguinal lymph nodes. He has also had a port placed and echocardiogram performed.   Previously Mr. Nishiyama has had the opportunity to ask any questions or concerns that he has had about his disease or chemotherapy moving forward.  He notes that he is aware of the risks and benefits of treatment and is willing and able to proceed with treatment today.  The treatment of choice for this patient's cancer would be BV-CHP chemotherapy.  This regimen consists of doxorubicin 50 mg per metered squared on day 1, cyclophosphamide 750 mg per metered squared on day 1, and brentuximab 1.8 mg/kg on day 1 of a 21-day cycle.  The patient also received prednisone 100mg  PO on days 1-5.  We will proceed for 6 cycles.  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  --port had been placed, TTE shows adequate heart function for anthracycline therapy.  --PET CT scan shows involvement of the inguinal/axillary lymph nodes. Findings consistent with Stage III disease.  --plan for BV-CHP chemotherapy as noted above. Cycle 1 Day 1 of treatment started on 02/23/2020. Today is Cycle 3 Day 1.  --RTC on 04/26/2020 for Cycle 4 Day 1 of treatment  #Supportive Care --zofran 8mg  q8H PRN and compazine 10mg  PO q6H for nausea -- allopurinol 300mg  PO daily for TLS prophylaxis -- EMLA cream for port -- no pain medication required at this time.   No orders of the defined types were placed in this encounter.   All questions were answered. The patient knows to call the clinic with any problems, questions or concerns.  A total of more than 30 minutes were spent  on this encounter and over half of that  time was spent on counseling and coordination of care as outlined above.   Ledell Peoples, MD Department of Hematology/Oncology [facility] Phone: [phone] Pager: [phone] Email: [email or web address]  04/06/2020 11:44 AM   Literature Support:  Lynden Ang OA, Pro B, [place], [place], [place], [place], [place], Morschhauser F, Domingo-Domenech E, Margot Chimes [address] D, [place] , [place], [place], [place] SP, Shustov A, Httmann A, [place], Yuen S, Iyer S, Zinzani PL, Hua Z, [place] M, Romelle Starcher T, Trmper L; [place] Study Group. 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 Lancet. 2019 Jan 19;393(10168):229-240.   --Median progression-free survival was 482 months (95% CI 352-not evaluable) in the A+CHP group and 208 months (127-476) in the CHOP group (hazard ratio 071 [95% CI 054-093], p=[id number]).   --Front-line treatment with A+CHP is superior to CHOP for patients with CD30-positive peripheral T-cell lymphomas as shown by a significant improvement in progression-free survival and overall survival with a manageable safety profile.

## 2020-04-05 NOTE — Progress Notes (Signed)
Nutrition Assessment  55 year old male with Lymphoma currently undergoing B-CHP chemotherapy. He is followed by Dr. Lorenso Courier  Met with patient during infusion, he was eating an apple and watching basketball. Patient reports good appetite, had fried eggs and bologna for breakfast. Yesterday, he ate eggs with bacon for breakfast, tuna sandwich with nuts and strawberries for lunch, chicken wings, green beans, and corn for dinner. He drinks 64 ounces of water and 2 Boost daily. Patient endorses recent weight due to having a stomach bug a few weeks ago, experienced nausea, vomiting and diarrhea for 3 days. This has resolved, weights have trended up.  He denies nausea, vomiting, diarrhea, constipation, reports regular bowel movements daily.   Nutrition Focused Physical Exam: deferred   Medications: Protonix, Zofran, Prednisone, Compazine, Imodium   Labs: Glucose 114, Hgb 10.2   Anthropometrics: Weight today 140.6 lbs increased from 133.5 lbs on 2/4  Height: 5'8" Weight: 140.6 lbs (63.9 kg) UBW: 150 lbs (July 2021) BMI: 21.42    NUTRITION DIAGNOSIS: Increased nutrient needs secondary to cancer and related treatments    INTERVENTION:  Educated on small frequent meals and snacks high in calories and protein Strategies for increasing calories at meal times Encouraged 3 Ensure daily Complimentary case of Ensure Enlive provided today Handouts provided Patient given RD contact information   MONITORING, EVALUATION, GOAL:  Patient will consume sufficient calories and protein to prevent weight loss during treatment.    Next Visit: April 7 in infusion

## 2020-04-09 ENCOUNTER — Telehealth: Payer: Self-pay | Admitting: Hematology and Oncology

## 2020-04-09 NOTE — Telephone Encounter (Signed)
Checked out appointment. No LOS notes needing to be scheduled. No changes made. 

## 2020-04-18 ENCOUNTER — Inpatient Hospital Stay (HOSPITAL_COMMUNITY)
Admission: EM | Admit: 2020-04-18 | Discharge: 2020-04-20 | DRG: 394 | Disposition: A | Discharge status: Home / Self Care | Payer: Self-pay | Attending: Student | Admitting: Student

## 2020-04-18 ENCOUNTER — Emergency Department (HOSPITAL_COMMUNITY): Payer: Self-pay

## 2020-04-18 ENCOUNTER — Encounter (HOSPITAL_COMMUNITY): Payer: Self-pay

## 2020-04-18 DIAGNOSIS — C8590 Non-Hodgkin lymphoma, unspecified, unspecified site: Secondary | ICD-10-CM | POA: Diagnosis present

## 2020-04-18 DIAGNOSIS — C8460 Anaplastic large cell lymphoma, ALK-positive, unspecified site: Secondary | ICD-10-CM | POA: Diagnosis present

## 2020-04-18 DIAGNOSIS — K610 Anal abscess: Secondary | ICD-10-CM | POA: Diagnosis present

## 2020-04-18 DIAGNOSIS — Z87891 Personal history of nicotine dependence: Secondary | ICD-10-CM

## 2020-04-18 DIAGNOSIS — F1290 Cannabis use, unspecified, uncomplicated: Secondary | ICD-10-CM | POA: Diagnosis present

## 2020-04-18 DIAGNOSIS — D72819 Decreased white blood cell count, unspecified: Secondary | ICD-10-CM | POA: Diagnosis present

## 2020-04-18 DIAGNOSIS — Z20822 Contact with and (suspected) exposure to covid-19: Secondary | ICD-10-CM | POA: Diagnosis present

## 2020-04-18 DIAGNOSIS — E860 Dehydration: Secondary | ICD-10-CM | POA: Diagnosis present

## 2020-04-18 DIAGNOSIS — M109 Gout, unspecified: Secondary | ICD-10-CM | POA: Diagnosis present

## 2020-04-18 DIAGNOSIS — D649 Anemia, unspecified: Secondary | ICD-10-CM | POA: Diagnosis present

## 2020-04-18 DIAGNOSIS — I700 Atherosclerosis of aorta: Secondary | ICD-10-CM | POA: Diagnosis present

## 2020-04-18 DIAGNOSIS — K603 Anal fistula: Secondary | ICD-10-CM

## 2020-04-18 DIAGNOSIS — K612 Anorectal abscess: Principal | ICD-10-CM | POA: Diagnosis present

## 2020-04-18 DIAGNOSIS — M545 Low back pain, unspecified: Secondary | ICD-10-CM | POA: Diagnosis present

## 2020-04-18 DIAGNOSIS — Z23 Encounter for immunization: Secondary | ICD-10-CM

## 2020-04-18 DIAGNOSIS — G8929 Other chronic pain: Secondary | ICD-10-CM | POA: Diagnosis present

## 2020-04-18 DIAGNOSIS — K611 Rectal abscess: Secondary | ICD-10-CM | POA: Diagnosis present

## 2020-04-18 DIAGNOSIS — L0291 Cutaneous abscess, unspecified: Secondary | ICD-10-CM

## 2020-04-18 LAB — COMPREHENSIVE METABOLIC PANEL
ALT: 16 U/L (ref 0–44)
AST: 14 U/L — ABNORMAL LOW (ref 15–41)
Albumin: 3.4 g/dL — ABNORMAL LOW (ref 3.5–5.0)
Alkaline Phosphatase: 47 U/L (ref 38–126)
Anion gap: 7 (ref 5–15)
BUN: 20 mg/dL (ref 6–20)
CO2: 26 mmol/L (ref 22–32)
Calcium: 8.8 mg/dL — ABNORMAL LOW (ref 8.9–10.3)
Chloride: 104 mmol/L (ref 98–111)
Creatinine, Ser: 0.84 mg/dL (ref 0.61–1.24)
GFR, Estimated: >60 mL/min (ref >60)
Glucose, Bld: 107 mg/dL — ABNORMAL HIGH (ref 70–99)
Potassium: 4.4 mmol/L (ref 3.5–5.1)
Sodium: 137 mmol/L (ref 135–145)
Total Bilirubin: 0.6 mg/dL (ref 0.3–1.2)
Total Protein: 6.8 g/dL (ref 6.5–8.1)

## 2020-04-18 LAB — CBC WITH DIFFERENTIAL/PLATELET
Abs Immature Granulocytes: 0.01 10*3/uL (ref 0.00–0.07)
Basophils Absolute: 0.1 10*3/uL (ref 0.0–0.1)
Basophils Relative: 2 %
Eosinophils Absolute: 0.1 10*3/uL (ref 0.0–0.5)
Eosinophils Relative: 2 %
HCT: 31.7 % — ABNORMAL LOW (ref 39.0–52.0)
Hemoglobin: 10.4 g/dL — ABNORMAL LOW (ref 13.0–17.0)
Immature Granulocytes: 0 %
Lymphocytes Relative: 28 %
Lymphs Abs: 1 10*3/uL (ref 0.7–4.0)
MCH: 31.4 pg (ref 26.0–34.0)
MCHC: 32.8 g/dL (ref 30.0–36.0)
MCV: 95.8 fL (ref 80.0–100.0)
Monocytes Absolute: 0.9 10*3/uL (ref 0.1–1.0)
Monocytes Relative: 26 %
Neutro Abs: 1.5 10*3/uL — ABNORMAL LOW (ref 1.7–7.7)
Neutrophils Relative %: 42 %
Platelets: 219 10*3/uL (ref 150–400)
RBC: 3.31 MIL/uL — ABNORMAL LOW (ref 4.22–5.81)
RDW: 16.9 % — ABNORMAL HIGH (ref 11.5–15.5)
WBC: 3.5 10*3/uL — ABNORMAL LOW (ref 4.0–10.5)
nRBC: 0 % (ref 0.0–0.2)

## 2020-04-18 LAB — POC OCCULT BLOOD, ED: Fecal Occult Bld: POSITIVE — AB

## 2020-04-18 LAB — RESP PANEL BY RT-PCR (FLU A&B, COVID) ARPGX2
Influenza A by PCR: NEGATIVE
Influenza B by PCR: NEGATIVE
SARS Coronavirus 2 by RT PCR: NEGATIVE

## 2020-04-18 MED ORDER — ALBUTEROL SULFATE (2.5 MG/3ML) 0.083% IN NEBU: 2.5000 mg | INHALATION_SOLUTION | RESPIRATORY_TRACT | Status: DC | PRN

## 2020-04-18 MED ORDER — SODIUM CHLORIDE 0.9% FLUSH
3.0000 mL | Freq: Two times a day (BID) | INTRAVENOUS | Status: DC
  Administered 2020-04-18 – 2020-04-19 (×3): 3 mL via INTRAVENOUS

## 2020-04-18 MED ORDER — GADOBUTROL 1 MMOL/ML IV SOLN
6.0000 mL | Freq: Once | INTRAVENOUS | Status: AC | PRN
  Administered 2020-04-18: 6 mL via INTRAVENOUS

## 2020-04-18 MED ORDER — SENNA 8.6 MG PO TABS
1.0000 | ORAL_TABLET | Freq: Two times a day (BID) | ORAL | Status: DC
  Administered 2020-04-18 – 2020-04-19 (×2): 8.6 mg via ORAL
  Filled 2020-04-18 (×3): qty 1

## 2020-04-18 MED ORDER — MAGNESIUM CITRATE PO SOLN: 1.0000 | Freq: Once | ORAL | Status: DC | PRN

## 2020-04-18 MED ORDER — ACETAMINOPHEN 500 MG PO TABS
1000.0000 mg | ORAL_TABLET | Freq: Once | ORAL | Status: AC
  Administered 2020-04-18: 1000 mg via ORAL
  Filled 2020-04-18: qty 2

## 2020-04-18 MED ORDER — PIPERACILLIN-TAZOBACTAM 3.375 G IVPB
3.3750 g | Freq: Three times a day (TID) | INTRAVENOUS | Status: DC
  Administered 2020-04-18 – 2020-04-20 (×5): 3.375 g via INTRAVENOUS
  Filled 2020-04-18 (×5): qty 50

## 2020-04-18 MED ORDER — MORPHINE SULFATE (PF) 2 MG/ML IV SOLN: 1.0000 mg | INTRAVENOUS | Status: DC | PRN

## 2020-04-18 MED ORDER — LIDOCAINE-EPINEPHRINE (PF) 2 %-1:200000 IJ SOLN
20.0000 mL | Freq: Once | INTRAMUSCULAR | Status: DC
  Filled 2020-04-18: qty 20

## 2020-04-18 MED ORDER — PIPERACILLIN-TAZOBACTAM 3.375 G IVPB 30 MIN
3.3750 g | Freq: Once | INTRAVENOUS | Status: AC
  Administered 2020-04-18: 3.375 g via INTRAVENOUS
  Filled 2020-04-18: qty 50

## 2020-04-18 MED ORDER — IOHEXOL 300 MG/ML  SOLN
100.0000 mL | Freq: Once | INTRAMUSCULAR | Status: AC | PRN
  Administered 2020-04-18: 100 mL via INTRAVENOUS

## 2020-04-18 MED ORDER — POLYETHYLENE GLYCOL 3350 17 G PO PACK: 17.0000 g | PACK | Freq: Every day | ORAL | Status: DC | PRN

## 2020-04-18 MED ORDER — PANTOPRAZOLE SODIUM 40 MG PO TBEC
40.0000 mg | DELAYED_RELEASE_TABLET | Freq: Every day | ORAL | Status: DC
  Administered 2020-04-19: 40 mg via ORAL
  Filled 2020-04-18: qty 1

## 2020-04-18 MED ORDER — TETANUS-DIPHTH-ACELL PERTUSSIS 5-2.5-18.5 LF-MCG/0.5 IM SUSY
0.5000 mL | PREFILLED_SYRINGE | Freq: Once | INTRAMUSCULAR | Status: AC
  Administered 2020-04-18: 0.5 mL via INTRAMUSCULAR
  Filled 2020-04-18: qty 0.5

## 2020-04-18 MED ORDER — ALLOPURINOL 300 MG PO TABS
300.0000 mg | ORAL_TABLET | Freq: Every day | ORAL | Status: DC
  Administered 2020-04-19: 300 mg via ORAL
  Filled 2020-04-18: qty 1

## 2020-04-18 MED ORDER — OXYCODONE HCL 5 MG PO TABS: 5.0000 mg | ORAL_TABLET | ORAL | Status: DC | PRN

## 2020-04-18 MED ORDER — HYDRALAZINE HCL 20 MG/ML IJ SOLN: 10.0000 mg | Freq: Four times a day (QID) | INTRAMUSCULAR | Status: DC | PRN

## 2020-04-18 MED ORDER — ACETAMINOPHEN 650 MG RE SUPP: 650.0000 mg | Freq: Four times a day (QID) | RECTAL | Status: DC | PRN

## 2020-04-18 MED ORDER — SORBITOL 70 % SOLN
30.0000 mL | Freq: Every day | Status: DC | PRN
  Filled 2020-04-18: qty 30

## 2020-04-18 MED ORDER — ACETAMINOPHEN 325 MG PO TABS: 650.0000 mg | ORAL_TABLET | Freq: Four times a day (QID) | ORAL | Status: DC | PRN

## 2020-04-18 MED ORDER — ONDANSETRON HCL 4 MG/2ML IJ SOLN: 4.0000 mg | Freq: Four times a day (QID) | INTRAMUSCULAR | Status: DC | PRN

## 2020-04-18 MED ORDER — IPRATROPIUM BROMIDE 0.02 % IN SOLN: 0.5000 mg | RESPIRATORY_TRACT | Status: DC | PRN

## 2020-04-18 MED ORDER — SODIUM CHLORIDE 0.9 % IV SOLN: INTRAVENOUS | Status: DC

## 2020-04-18 MED ORDER — ONDANSETRON HCL 4 MG PO TABS: 4.0000 mg | ORAL_TABLET | Freq: Four times a day (QID) | ORAL | Status: DC | PRN

## 2020-04-18 MED ORDER — SODIUM CHLORIDE 0.9 % IV SOLN: Freq: Once | INTRAVENOUS | Status: AC

## 2020-04-18 NOTE — Progress Notes (Signed)
Pharmacy Antibiotic Note  Tanner Gallagher is a 55 y.o. male admitted on 04/18/2020 with peri-rectal abscesses.  Pharmacy has been consulted for zosyn dosing.  Plan: Zosyn 3.375g IV q8h (4 hour infusion).  Pharmacy to sign off  Height: 5\' 8"  (172.7 cm) Weight: 62.6 kg (138 lb) IBW/kg (Calculated) : 68.4  Temp (24hrs), Avg:97.9 F (36.6 C), Min:97.6 F (36.4 C), Max:98.1 F (36.7 C)  Recent Labs  Lab 04/18/20 0743  WBC 3.5*  CREATININE 0.84    Estimated Creatinine Clearance: 89 mL/min (by C-G formula based on SCr of 0.84 mg/dL).    No Known Allergies  Thank you for allowing pharmacy to be a part of this patient's care.  Eudelia Bunch, Pharm.D 04/18/2020 4:09 PM

## 2020-04-18 NOTE — H&P (Signed)
History and Physical    Tavoris Brisk OXB:[id number] DOB: [birth year] DOA: 04/18/2020  PCP: Pcp, No  Patient coming from: Home  I have personally briefly reviewed patient's old medical records in [place]  Chief Complaint: Rectal abscess  HPI: Tanner Gallagher is a 55 y.o. 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 Patient noted with prior history of perirectal abscesses requiring I&D in the OR per Dr. Ninfa Linden 06/16/2017, also noted to have bouts of perirectal abscesses to occur usually after chemo that usually drain and resolve on their own. Patient stated perirectal pain/abscesses started 4 days prior to admission which were noted as small bumps around his rectum area which have subsequently gotten bigger in size, significant tenderness, some clear/brownish drainage 2 days prior to admission.  Patient stated tried some sitz bath's with no significant improvement and subsequently presented to the ED. Patient denies any fevers, no chills, no nausea, no vomiting, no abdominal pain, no diarrhea, no constipation, no hematemesis, no hematochezia, no melena, no lightheadedness, no syncopal episodes, no asymmetric weakness or numbness, no headaches.  ED Course: Patient seen in the ED, comprehensive metabolic profile done unremarkable.  CBC done with a white count of 3.5, hemoglobin of 10.4 platelet count of 219 otherwise was within normal limits.  COVID-19 PCR pending.  CT pelvis done with with density collections along ischio rectal fossa bilaterally suspicious for small abscesses, ill-definition of tissue planes between anus and external sphincter probably from inflammation, increased confluence of a band of density extending from about 3 o'clock position of the left anal  region into subcutaneous tissue along the medial left buttocks.  Aortic atherosclerosis.  Substantially reduced pelvic adenopathy compared to 02/24/2020, prominent atrophy of both hips.  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  Second simple intersphincteric fistula which extends posteriorly and inferiorly into the gluteal crease.  No associated abscess.  Bilateral inguinal lymphadenopathy incompletely visualized but appears decreased compared to recent PET/CT. General surgery consulted.  Review of Systems: As per HPI otherwise all other systems reviewed and are negative.  Past Medical History:  Diagnosis Date  . Cancer (HCC)    lymph nodes  . Chronic lower back pain 15 years  . Medical history non-contributory   . Tooth pain    no abscess or drainage per pt 02-01-2020    Past Surgical History:  Procedure Laterality Date  . INCISION AND DRAINAGE PERIRECTAL ABSCESS  06/16/2017  . INCISION AND DRAINAGE PERIRECTAL ABSCESS N/A 06/16/2017   Procedure: IRRIGATION AND DEBRIDEMENT PERIRECTAL ABSCESS;  Surgeon: Coralie Keens, MD;  Location: [facility];  Service: General;  Laterality: N/A;  . INGUINAL HERNIA REPAIR Right 2000s  . IR IMAGING GUIDED PORT INSERTION  02/15/2020  . LYMPH NODE BIOPSY Right 02/02/2020   Procedure: EXCISIONAL INGUINAL LYMPH NODE BIOPSY;  Surgeon: Leighton Ruff, MD;  Location: [place];  Service: General;  Laterality: Right;  . PROCTOSCOPY  06/16/2017   Procedure: RIGID PROCTOSCOPY;  Surgeon: Coralie Keens, MD;  Location: [place];  Service: General;;    Social History  reports that he quit smoking about 2 years ago. His smoking use included cigarettes. He has a 4.44 pack-year smoking history. He has never used smokeless tobacco. He reports previous alcohol use. He reports current drug use. Drug:  Marijuana.  No Known Allergies  Family History  Problem  Relation Age of Onset  . Diabetes Neg Hx    Mother alive age 44 and healthy.  Father deceased age 41 from a car accident per patient.  Prior to Admission medications   Medication Sig Start Date End Date Taking? Authorizing Provider  acetaminophen (TYLENOL) 500 MG tablet Take 1,000 mg by mouth every 6 (six) hours as needed for mild pain, fever or headache.   Yes [provider]  allopurinol (ZYLOPRIM) 300 MG tablet Take 1 tablet (300 mg total) by mouth daily. 04/05/20  Yes Orson Slick, MD  lidocaine-prilocaine (EMLA) cream Apply 1 application topically as needed. Patient taking differently: Apply 1 application topically as needed (port access/hemmoroids). 02/17/20  Yes Orson Slick, MD  ondansetron (ZOFRAN) 8 MG tablet Take 1 tablet (8 mg total) by mouth every 8 (eight) hours as needed for nausea or vomiting. 02/17/20  Yes Orson Slick, MD  pantoprazole (PROTONIX) 40 MG tablet Take 1 tablet (40 mg total) by mouth daily. 03/01/20  Yes Orson Slick, MD  predniSONE (DELTASONE) 20 MG tablet Take 5 tablets (100 mg total) by mouth daily with breakfast. Take for 5 days, starting on Day 1 of your chemotherapy 03/15/20  Yes Orson Slick, MD  prochlorperazine (COMPAZINE) 10 MG tablet Take 1 tablet (10 mg total) by mouth every 6 (six) hours as needed for nausea or vomiting. 02/17/20  Yes Orson Slick, MD    Physical Exam: Vitals:   04/18/20 1422 04/18/20 1445 04/18/20 1545 04/18/20 1627  BP: 108/71 120/76 128/83 116/79  Pulse: 78 85 84 79  Resp: 18 18 18    Temp: 98.1 F (36.7 C)     TempSrc: Oral     SpO2: 100% 98% 98% 98%  Weight:      Height:        Constitutional: NAD, calm, comfortable Vitals:   04/18/20 1422 04/18/20 1445 04/18/20 1545 04/18/20 1627  BP: 108/71 120/76 128/83 116/79  Pulse: 78 85 84 79  Resp: 18 18 18    Temp: 98.1 F (36.7 C)     TempSrc: Oral     SpO2: 100% 98% 98% 98%  Weight:      Height:       Eyes: PERRL, lids and  conjunctivae normal ENMT: Mucous membranes are moist. Posterior pharynx clear of any exudate or lesions.Normal dentition.  Neck: normal, supple, no masses, no thyromegaly Respiratory: clear to auscultation bilaterally, no wheezing, no crackles. Normal respiratory effort. No accessory muscle use.  Cardiovascular: Regular rate and rhythm, no murmurs / rubs / gallops. No extremity edema. 2+ pedal pulses. No carotid bruits.  Abdomen: no tenderness, no masses palpated. No hepatosplenomegaly. Bowel sounds positive.  Musculoskeletal: no clubbing / cyanosis. No joint deformity upper and lower extremities. Good ROM, no contractures. Normal muscle tone.  GU: Perirectal area with significant induration, tender to palpation, no significant drainage noted. Skin: no rashes, lesions, ulcers. No induration Neurologic: CN 2-12 grossly intact. Sensation intact, DTR normal. Strength 5/5 in all 4.  Psychiatric: Normal judgment and insight. Alert and oriented x 3. Normal mood.   Labs on Admission: I have personally reviewed following labs and imaging studies  CBC: Recent Labs  Lab 04/18/20 0743  WBC 3.5*  NEUTROABS 1.5*  HGB 10.4*  HCT 31.7*  MCV 95.8  PLT 983    Basic Metabolic Panel: Recent Labs  Lab 04/18/20 0743  NA 137  K 4.4  CL 104  CO2 26  GLUCOSE 107*  BUN 20  CREATININE 0.84  CALCIUM 8.8*    GFR: Estimated Creatinine Clearance: 89 mL/min (by C-G formula based on SCr of 0.84 mg/dL).  Liver Function Tests: Recent Labs  Lab 04/18/20 0743  AST 14*  ALT 16  ALKPHOS 47  BILITOT 0.6  PROT 6.8  ALBUMIN 3.4*    Urine analysis:    Component Value Date/Time   COLORURINE YELLOW 07/02/2019 1011   APPEARANCEUR CLEAR 07/02/2019 1011   LABSPEC 1.018 07/02/2019 1011   PHURINE 5.0 07/02/2019 1011   GLUCOSEU NEGATIVE 07/02/2019 1011   HGBUR NEGATIVE 07/02/2019 Gross 07/02/2019 1011   KETONESUR NEGATIVE 07/02/2019 1011   PROTEINUR NEGATIVE 07/02/2019 1011    NITRITE NEGATIVE 07/02/2019 1011   LEUKOCYTESUR SMALL (A) 07/02/2019 1011    Radiological Exams on Admission: CT PELVIS W CONTRAST  Result Date: 04/18/2020 CLINICAL DATA:  Perirectal abscess.  Lymphoma, recent chemotherapy. EXAM: CT PELVIS WITH CONTRAST TECHNIQUE: Multidetector CT imaging of the pelvis was performed using the standard protocol following the bolus administration of intravenous contrast. CONTRAST:  177mL OMNIPAQUE IOHEXOL 300 MG/ML  SOLN COMPARISON:  Multiple exams, including PET-CT from 02/24/2020 and CT pelvis 10/21/2019 FINDINGS: Urinary Tract:  Unremarkable Bowel: Ill definition of perianal soft tissues, probably inflammatory. Potential fluid collections bilaterally in the ischiorectal fossa, measuring about 2.6 by 1.0 by 2.0 cm (volume = 2.7 cm^3) on the left and about 1.6 by 0.9 by 1.5 cm (volume = 1 cm^3) on the right on image 40 of series 2. Abnormal bandlike stranding extends from the external sphincter region at the 3 o'clock position into the subcutaneous tissues of the left medial buttock on images 13 through 16 of series 10, and appears more prominent than on 10/21/2019. Although I not see definite gas density tracking in this region, I cannot exclude a perianal fistula. Vascular/Lymphatic: Aortoiliac atherosclerotic vascular disease. Substantially reduced inguinal adenopathy compared to 10/21/2019 and 02/24/2020, a right inguinal lymph node measures 1.0 cm in short axis on image 31 of series 2, previously 2.0 cm on 02/24/2020. Reproductive:  Unremarkable Other:  No supplemental non-categorized findings. Musculoskeletal: Prominent arthropathy of both hips with prominent spurring and moderate to prominent loss of articular cartilage. Extensive degenerative subcortical cyst formation along the femoral heads and acetabula. IMPRESSION: 1. Small fluid density collections along the ischiorectal fossa bilaterally suspicious for small abscesses. Ill definition of tissue planes between the  anus and external sphincter probably from inflammation. Increased confluence of a band of density extending from about the 3 o'clock position of the left anal region into the subcutaneous tissues along the medial left buttock, this could be an inflammatory band but perianal fistula is not excluded. Clinically warranted, perianal fistula protocol MRI could be utilized for further characterization of the region. 2.  Aortic Atherosclerosis (ICD10-I70.0). 3. Substantially reduced pelvic adenopathy compared to 02/24/2020 PET-CT 4. Prominent arthropathy of both hips. Electronically Signed   By: Van Clines M.D.   On: 04/18/2020 10:27   MR PELVIS W WO CONTRAST  Result Date: 04/18/2020 CLINICAL DATA:  Perianal abscess, with increased rectal pain. Currently undergoing chemotherapy for T-cell lymphoma EXAM: MRI PELVIS WITHOUT AND WITH CONTRAST TECHNIQUE: Multiplanar multisequence MR imaging of the pelvis was performed both before and after administration of intravenous contrast. CONTRAST:  94mL GADAVIST GADOBUTROL 1 MMOL/ML IV SOLN COMPARISON:  PET-CT on 02/24/2020 FINDINGS: Lower Urinary Tract: Unremarkable urinary bladder. Bowel: A complex transsphincteric fistula is seen which  arises from the left posterior wall of the upper anal canal, near the 4 o'clock position (image 22/6). This becomes complex with branching tracts which extend posteriorly and laterally into the bilateral ischioanal fossae. These also show mild bilateral supralevator extension. Associated abscesses are seen in the left lower ischiorectal fossa measuring 3.5 x 1.7 cm, and in the right upper ischiorectal fossa measuring 2.2 x 1.5 cm. A 2nd simple intersphincteric fistula is seen arising from the posterior wall of the inferior anal canal at the 5 o'clock position (image 26/6). This fistula extends posteriorly and inferiorly into the gluteal crease. No associated abscess. Vascular/Lymphatic: Bilateral inguinal lymphadenopathy is incompletely  visualized on this exam, but appears decreased compared to recent PET-CT. Reproductive: Unremarkable appearance of prostate and seminal vesicles. Other: None. Musculoskeletal: No significant abnormality identified. 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 (Grade 5) 2nd simple intersphincteric fistula, which extends posteriorly and inferiorly into the gluteal crease. No associated abscess. (Grade 1) Bilateral inguinal lymphadenopathy is incompletely visualized, but appears decreased compared to recent PET-CT. Electronically Signed   By: Marlaine Hind M.D.   On: 04/18/2020 14:38    EKG: Not done.  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 -CT pelvis with small fluid collections along ischio rectal fossa bilaterally suspicious for small abscesses, increased confluence of a band of density extending from about 3 o'clock position of the left anal region into subcutaneous tissues along medial left buttocks possibly an inflammatory band of perianal fistula. -MRI pelvis complex transsphincteric perianal fistula with bilateral perianal abscesses and mild bilateral supralevator extension.  Second simple intersphincteric fistula which extends posteriorly and inferiorly into the gluteal crease.  No associated abscess.  Bilateral inguinal lymphadenopathy incompletely visualized but appears decreased compared to recent PET/CT. -Patient seen in consultation by general surgery were recommending IV antibiotics with Zosyn, sitz bath's. -Reassess in the a.m. to determine whether patient may need I&D per general surgery.  2.  Dehydration IV  fluids.  3.  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  Will inform oncology of admission via epic.     DVT prophylaxis: SCDs Code Status:  Full Family Communication:  Updated patient.  No family at bedside. Disposition Plan:   Patient is from:  Home  Anticipated DC to:  Home  Anticipated DC date:  2 to 3 days  Anticipated DC barriers: Undetermined  Consults called:  General surgery: Dr. Kae Heller 04/18/2020 Admission status:  Place in observation/MedSurg.  Severity of Illness:     Irine Seal MD [facility]  How to contact the [facility] Attending or Consulting provider [place] or covering provider during after hours [place], for this patient?   1. Check the care team in [facility] and look for a) attending/consulting [facility] provider listed and b) the [facility] team listed 2. Log into [email or web address] and use [place]'s universal password to access. If you do not have the password, please contact the hospital operator. 3. Locate the [facility], The provider you are looking for under [facility] and page to a number that you can be directly reached. 4. If you still have difficulty reaching the provider, please page the  DOC (Director on Call) for the Hospitalists listed on amion for assistance.  04/18/2020, 5:18 PM

## 2020-04-18 NOTE — ED Notes (Signed)
Pt resting quietly in bed. VSS at this time. Pt denies any complaints or concerns at this time. Pt attached to cardiac monitor x2.

## 2020-04-18 NOTE — ED Notes (Signed)
Report received from [place], RN. Pt resting quietly in bed. No complaints or concerns at this time. VSS.

## 2020-04-18 NOTE — ED Notes (Signed)
MRI aware.

## 2020-04-18 NOTE — Consult Note (Signed)
[facility] Surgery Consult Note  Tanner Gallagher 02-Sep-1965  [id number].    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 He has a prior h/o perirectal abscess requiring I&D in the operating room by Dr. Ninfa Linden 06/16/2017. States that he noticed small bumps around his rectum earlier this week. These have grown in size and are tender. He reports clear/brown drainage from these areas 2 days ago. He usually develops these after chemotherapy, but they typically resolve on their own and he has not required further intervention in the last few years. Denies fever or chills. He tried doing sitz baths but this did not help so he came to the ED.  In the ED he was found to be afebrile, VSS. Leukopenic with WBC 3.5. CT scan showed small fluid density collections along the ischiorectal fossa bilaterally suspicious for small abscesses (2.6x1.0x2.0 cm on the left and about 1.6x0.9x1.5 cm); increased confluence of a band of density extending from about the 3 o'clock position of the left anal region into the subcutaneous tissues along the medial left buttock, possibly an inflammatory band or perianal fistula. MRI pelvis perianal fistula protocol ordered.   General surgery asked to see.  Anticoagulants: none Former smoker, quit in 2019 Denies alcohol use Admits to [facility] use, otherwise denies illicit drug use  ROS  All systems reviewed and otherwise negative except for as above  Family History  Problem Relation Age of Onset  . Diabetes Neg Hx     Past Medical History:  Diagnosis Date  . Cancer (HCC)    lymph nodes  . Chronic lower back pain 15 years  . Medical history non-contributory   . Tooth pain    no abscess or drainage per pt 02-01-2020    Past  Surgical History:  Procedure Laterality Date  . INCISION AND DRAINAGE PERIRECTAL ABSCESS  06/16/2017  . INCISION AND DRAINAGE PERIRECTAL ABSCESS N/A 06/16/2017   Procedure: IRRIGATION AND DEBRIDEMENT PERIRECTAL ABSCESS;  Surgeon: Coralie Keens, MD;  Location: [place];  Service: General;  Laterality: N/A;  . INGUINAL HERNIA REPAIR Right 2000s  . IR IMAGING GUIDED PORT INSERTION  02/15/2020  . LYMPH NODE BIOPSY Right 02/02/2020   Procedure: EXCISIONAL INGUINAL LYMPH NODE BIOPSY;  Surgeon: Leighton Ruff, MD;  Location: [place];  Service: General;  Laterality: Right;  . PROCTOSCOPY  06/16/2017   Procedure: RIGID PROCTOSCOPY;  Surgeon: Coralie Keens, MD;  Location: [place];  Service: General;;    Social History:  reports that he quit smoking about 2 years ago. His smoking use included cigarettes. He has a 4.44 pack-year smoking history. He has never used smokeless tobacco. He reports previous alcohol use. He reports current drug use. Drug: Marijuana.  Allergies: No Known Allergies  (Not in a hospital admission)   Prior to Admission medications   Medication Sig Start Date End Date Taking? Authorizing Provider  acetaminophen (TYLENOL) 500 MG tablet Take 1,000 mg by mouth every 6 (six) hours as needed for mild pain, fever or headache.   Yes [provider]  allopurinol (ZYLOPRIM) 300 MG tablet Take 1 tablet (300 mg total) by mouth daily. 04/05/20  Yes Orson Slick, MD  lidocaine-prilocaine (EMLA) cream Apply 1 application topically as needed. Patient taking differently: Apply 1 application topically as needed (  port access/hemmoroids). 02/17/20  Yes Orson Slick, MD  ondansetron (ZOFRAN) 8 MG tablet Take 1 tablet (8 mg total) by mouth every 8 (eight) hours as needed for nausea or vomiting. 02/17/20  Yes Orson Slick, MD  pantoprazole (PROTONIX) 40 MG tablet Take 1 tablet (40 mg total) by mouth daily. 03/01/20  Yes Orson Slick, MD  predniSONE  (DELTASONE) 20 MG tablet Take 5 tablets (100 mg total) by mouth daily with breakfast. Take for 5 days, starting on Day 1 of your chemotherapy 03/15/20  Yes Orson Slick, MD  prochlorperazine (COMPAZINE) 10 MG tablet Take 1 tablet (10 mg total) by mouth every 6 (six) hours as needed for nausea or vomiting. 02/17/20  Yes Ledell Peoples IV, MD    Blood pressure 120/76, pulse 85, temperature 98.1 F (36.7 C), temperature source Oral, resp. rate 18, height 5\' 8"  (1.727 m), weight 62.6 kg, SpO2 98 %. Physical Exam: General: pleasant, WD/WN male who is laying in bed in NAD HEENT: head is normocephalic, atraumatic.  Sclera are noninjected.  Pupils equal and round.  Ears and nose without any masses or lesions.  Mouth is pink and moist. Dentition fair Heart: regular, rate, and rhythm.  Normal s1,s2. No obvious murmurs, gallops, or rubs noted.  Palpable pedal pulses bilaterally  Lungs: CTAB, no wheezes, rhonchi, or rales noted.  Respiratory effort nonlabored Abd: soft, NT/ND MS: no BUE/BLE edema, calves soft and nontender Skin: warm and dry with no masses, lesions, or rashes Psych: A&Ox4 with an appropriate affect Neuro: cranial nerves grossly intact, equal strength in BUE/BLE bilaterally, normal speech, thought process intact Rectal exam: There is induration bilaterally more significant on the patient's right and small areas of drainage with purulent fluid.  Very tender.  Digital rectal exam not performed.  Results for orders placed or performed during the hospital encounter of 04/18/20 (from the past 48 hour(s))  POC occult blood, ED     Status: Abnormal   Collection Time: 04/18/20  7:02 AM  Result Value Ref Range   Fecal Occult Bld POSITIVE (A) NEGATIVE  CBC with Differential     Status: Abnormal   Collection Time: 04/18/20  7:43 AM  Result Value Ref Range   WBC 3.5 (L) 4.0 - 10.5 K/uL   RBC 3.31 (L) 4.22 - 5.81 MIL/uL   Hemoglobin 10.4 (L) 13.0 - 17.0 g/dL   HCT 31.7 (L) 39.0 - 52.0 %   MCV  95.8 80.0 - 100.0 fL   MCH 31.4 26.0 - 34.0 pg   MCHC 32.8 30.0 - 36.0 g/dL   RDW 16.9 (H) 11.5 - 15.5 %   Platelets 219 150 - 400 K/uL   nRBC 0.0 0.0 - 0.2 %   Neutrophils Relative % 42 %   Neutro Abs 1.5 (L) 1.7 - 7.7 K/uL   Lymphocytes Relative 28 %   Lymphs Abs 1.0 0.7 - 4.0 K/uL   Monocytes Relative 26 %   Monocytes Absolute 0.9 0.1 - 1.0 K/uL   Eosinophils Relative 2 %   Eosinophils Absolute 0.1 0.0 - 0.5 K/uL   Basophils Relative 2 %   Basophils Absolute 0.1 0.0 - 0.1 K/uL   Immature Granulocytes 0 %   Abs Immature Granulocytes 0.01 0.00 - 0.07 K/uL   Reactive, Benign Lymphocytes PRESENT     Comment: Performed at [facility], [place] [address], [place], [place] [address]  Comprehensive metabolic panel     Status: Abnormal  Collection Time: 04/18/20  7:43 AM  Result Value Ref Range   Sodium 137 135 - 145 mmol/L   Potassium 4.4 3.5 - 5.1 mmol/L   Chloride 104 98 - 111 mmol/L   CO2 26 22 - 32 mmol/L   Glucose, Bld 107 (H) 70 - 99 mg/dL    Comment: Glucose reference range applies only to samples taken after fasting for at least 8 hours.   BUN 20 6 - 20 mg/dL   Creatinine, Ser 0.84 0.61 - 1.24 mg/dL   Calcium 8.8 (L) 8.9 - 10.3 mg/dL   Total Protein 6.8 6.5 - 8.1 g/dL   Albumin 3.4 (L) 3.5 - 5.0 g/dL   AST 14 (L) 15 - 41 U/L   ALT 16 0 - 44 U/L   Alkaline Phosphatase 47 38 - 126 U/L   Total Bilirubin 0.6 0.3 - 1.2 mg/dL   GFR, Estimated >60 >60 mL/min    Comment: (NOTE) Calculated using the CKD-EPI Creatinine Equation (2021)    Anion gap 7 5 - 15    Comment: Performed at [facility], [place] [address], [place], [place] [address]   CT PELVIS W CONTRAST  Result Date: 04/18/2020 CLINICAL DATA:  Perirectal abscess.  Lymphoma, recent chemotherapy. EXAM: CT PELVIS WITH CONTRAST TECHNIQUE: Multidetector CT imaging of the pelvis was performed using the standard protocol following the bolus administration of intravenous contrast. CONTRAST:   135mL OMNIPAQUE IOHEXOL 300 MG/ML  SOLN COMPARISON:  Multiple exams, including PET-CT from 02/24/2020 and CT pelvis 10/21/2019 FINDINGS: Urinary Tract:  Unremarkable Bowel: Ill definition of perianal soft tissues, probably inflammatory. Potential fluid collections bilaterally in the ischiorectal fossa, measuring about 2.6 by 1.0 by 2.0 cm (volume = 2.7 cm^3) on the left and about 1.6 by 0.9 by 1.5 cm (volume = 1 cm^3) on the right on image 40 of series 2. Abnormal bandlike stranding extends from the external sphincter region at the 3 o'clock position into the subcutaneous tissues of the left medial buttock on images 13 through 16 of series 10, and appears more prominent than on 10/21/2019. Although I not see definite gas density tracking in this region, I cannot exclude a perianal fistula. Vascular/Lymphatic: Aortoiliac atherosclerotic vascular disease. Substantially reduced inguinal adenopathy compared to 10/21/2019 and 02/24/2020, a right inguinal lymph node measures 1.0 cm in short axis on image 31 of series 2, previously 2.0 cm on 02/24/2020. Reproductive:  Unremarkable Other:  No supplemental non-categorized findings. Musculoskeletal: Prominent arthropathy of both hips with prominent spurring and moderate to prominent loss of articular cartilage. Extensive degenerative subcortical cyst formation along the femoral heads and acetabula. IMPRESSION: 1. Small fluid density collections along the ischiorectal fossa bilaterally suspicious for small abscesses. Ill definition of tissue planes between the anus and external sphincter probably from inflammation. Increased confluence of a band of density extending from about the 3 o'clock position of the left anal region into the subcutaneous tissues along the medial left buttock, this could be an inflammatory band but perianal fistula is not excluded. Clinically warranted, perianal fistula protocol MRI could be utilized for further characterization of the region. 2.  Aortic  Atherosclerosis (ICD10-I70.0). 3. Substantially reduced pelvic adenopathy compared to 02/24/2020 PET-CT 4. Prominent arthropathy of both hips. Electronically Signed   By: Van Clines M.D.   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 Lorenso Courier Chronic  low back pain Leukopenia  Anemia  Small bilateral perirectal abscesses - CT scan shows small fluid density collections along the ischiorectal fossa bilaterally suspicious for small abscesses (2.6x1.0x2.0 cm on the left and about 1.6x0.9x1.5 cm); increased confluence of a band of density extending from about the 3 o'clock position of the left anal region into the subcutaneous tissues along the medial left buttock, possibly an inflammatory band or perianal fistula  ID - zosyn VTE - ok for chemical DVT prophylaxis from surgical standpoint FEN - ok for diet Foley - none Follow up - TBD  Plan - Perirectal abscesses are small and are spontaneously drained, may respond well to antibiotics and sitz baths. Recommend medical admission, start on IV antibiotics and hold on I&D at this time.  We will follow.   [facility] 04/18/2020, 3:43 PM Please see Amion for pager number during day hours 7:00am-4:30pm

## 2020-04-18 NOTE — ED Notes (Signed)
Pt back in room from CT 

## 2020-04-18 NOTE — ED Notes (Signed)
Hospitalist at the bedside 

## 2020-04-18 NOTE — ED Notes (Signed)
Pt transported to floor via stretcher accompanied by ED tech.

## 2020-04-18 NOTE — ED Notes (Signed)
Pt to MRI

## 2020-04-18 NOTE — ED Notes (Signed)
Pt back from CT. Maintenance fluids restarted to right 20g peripheral IV.

## 2020-04-18 NOTE — ED Provider Notes (Signed)
  Physical Exam  BP 120/76   Pulse 85   Temp 98.1 F (36.7 C) (Oral)   Resp 18   Ht 5\' 8"  (1.727 m)   Wt 62.6 kg   SpO2 98%   BMI 20.98 kg/m   Physical Exam  ED Course/Procedures   Clinical Course as of 04/18/20 1610  Wed Apr 18, 2020  1608 WBC(!): 3.5 [JS]    Clinical Course User Index [JS] Janeece Fitting, PA-C    Procedures  MDM  Patient care assumed from [place]. PA at shift change, please see his note for full HPI.  Briefly, patient currently going under chemotherapy here for recurrent para rectal abscesses.  Previously drained intraoperatively by Dr. Ninfa Linden couple of years ago.  Plan was for general surgery to come see patient for further recommendations.  He will need to be admitted for likely intraoperative drainage by specialist service.  Medical team Dr. Lorenso Courier follows patient.  Antibiotics have been started for patient. He is currently receiving chemotherapy.   No fever, leukopenia likely from chemotherapy.Vitals are within normal limits. Call placed to hospitalist for further admission.   4:16 PM Dr. Grandville Silos hospitalist service who will admit patient for further management.   Portions of this note were generated with [employer]. Dictation errors may occur despite best attempts at proofreading.        Janeece Fitting, PA-C 04/18/20 1617    Carmin Muskrat, MD 04/20/20 [phone]

## 2020-04-18 NOTE — ED Provider Notes (Signed)
[facility] DEPT Provider Note   CSN: [id number] Arrival date & time: 04/18/20  9476     History Chief Complaint  Patient presents with  . Abscess    Gregary Blackard is a 55 y.o. 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 Lorenso Courier), history of multiple perirectal abscesses (previously drained by Dr. Ninfa Linden), chronic low back pain.  Patient presents today with chief complaint of perirectal abscesses.  Patient reports that he first noticed these on Saturday, 04/14/2020.  Reports that they have grown in size since then.  Patient reports associated pain.  Rates his pain 8/10 on the pain scale.  Patient reports that he has some relief with sitz baths.  He denies any aggravating factors.  Patient states that he had clear to brown discharge from abscess on Monday.  Patient reports that he normally develops perirectal abscesses after chemotherapy treatment.  He states normally these resolve spontaneously after a few days.  Current episode of perirectal abscesses have not resolved which is what brought him to the emergency department today.  Patient denies any receptive anal sex.  Patient denies any fever, chills, pain with defecation, blood in stool, melena, diarrhea, constipation, abdominal pain, nausea, vomiting, dysuria, hematuria, urinary frequency, genital swelling, genital sores or lesions, penile discharge.   HPI     Past Medical History:  Diagnosis Date  . Cancer (HCC)    lymph nodes  . Chronic lower back pain 15 years  . Medical history non-contributory   . Tooth pain    no abscess or drainage per pt 02-01-2020    Patient Active Problem List   Diagnosis Date Noted  . Port-A-Cath in place 03/01/2020  . 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 Perirectal abscess 06/16/2017    Past  Surgical History:  Procedure Laterality Date  . INCISION AND DRAINAGE PERIRECTAL ABSCESS  06/16/2017  . INCISION AND DRAINAGE PERIRECTAL ABSCESS N/A 06/16/2017   Procedure: IRRIGATION AND DEBRIDEMENT PERIRECTAL ABSCESS;  Surgeon: Coralie Keens, MD;  Location: [facility];  Service: General;  Laterality: N/A;  . INGUINAL HERNIA REPAIR Right 2000s  . IR IMAGING GUIDED PORT INSERTION  02/15/2020  . LYMPH NODE BIOPSY Right 02/02/2020   Procedure: EXCISIONAL INGUINAL LYMPH NODE BIOPSY;  Surgeon: Leighton Ruff, MD;  Location: [facility];  Service: General;  Laterality: Right;  . PROCTOSCOPY  06/16/2017   Procedure: RIGID PROCTOSCOPY;  Surgeon: Coralie Keens, MD;  Location: [facility] OR;  Service: General;;       Family History  Problem Relation Age of Onset  . Diabetes Neg Hx     Social History   Tobacco Use  . Smoking status: Former Smoker    Packs/day: 0.12    Years: 37.00    Pack years: 4.44    Types: Cigarettes    Quit date: 05/20/2017    Years since quitting: 2.9  . Smokeless tobacco: Never Used  Vaping Use  . Vaping Use: Never used  Substance Use Topics  . Alcohol use: Not Currently    Comment: rarew  . Drug use: Yes    Types: Marijuana    Comment: 02-01-2020 "once a day"    Home Medications Prior to Admission medications   Medication Sig Start Date End Date Taking? Authorizing Provider  acetaminophen (TYLENOL) 500 MG tablet Take 1,000 mg by mouth every 6 (six) hours as needed.    [provider]  allopurinol (ZYLOPRIM) 300 MG tablet Take 1 tablet (300 mg total) by mouth daily. 04/05/20   Orson Slick, MD  ibuprofen (ADVIL) 200 MG tablet Take 200 mg by mouth every 6 (six) hours as needed. Prn for tooth pain    [provider]  lidocaine-prilocaine (EMLA) cream Apply 1 application topically as needed. 02/17/20   Orson Slick, MD  ondansetron (ZOFRAN) 8 MG tablet Take 1 tablet (8 mg total) by mouth every 8 (eight) hours as needed for nausea  or vomiting. 02/17/20   Orson Slick, MD  pantoprazole (PROTONIX) 40 MG tablet Take 1 tablet (40 mg total) by mouth daily. 03/01/20   Orson Slick, MD  predniSONE (DELTASONE) 20 MG tablet Take 5 tablets (100 mg total) by mouth daily with breakfast. Take for 5 days, starting on Day 1 of your chemotherapy 03/15/20   Orson Slick, MD  prochlorperazine (COMPAZINE) 10 MG tablet Take 1 tablet (10 mg total) by mouth every 6 (six) hours as needed for nausea or vomiting. 02/17/20   Orson Slick, MD    Allergies    Patient has no known allergies.  Review of Systems   Review of Systems  Constitutional: Negative for chills and fever.  Respiratory: Negative for shortness of breath.   Cardiovascular: Negative for chest pain.  Gastrointestinal: Negative for abdominal distention, abdominal pain, anal bleeding, blood in stool, constipation, diarrhea, nausea, rectal pain and vomiting.  Genitourinary: Negative for decreased urine volume, difficulty urinating, dysuria, enuresis, flank pain, frequency, genital sores, hematuria, penile discharge, penile pain, penile swelling, scrotal swelling, testicular pain and urgency.  Musculoskeletal: Negative for back pain and neck pain.  Skin: Positive for wound. Negative for color change and rash.  Allergic/Immunologic: Positive for immunocompromised state.  Neurological: Negative for dizziness, syncope, light-headedness and headaches.  Psychiatric/Behavioral: Negative for confusion.    Physical Exam Updated Vital Signs BP 124/82   Pulse 87   Temp 97.8 F (36.6 C)   Resp 16   Ht 5\' 8"  (1.727 m)   Wt 62.6 kg   SpO2 98%   BMI 20.98 kg/m   Physical Exam Vitals and nursing note reviewed. Exam conducted with a chaperone present (male nurse tech present as chaperone.).  Constitutional:      General: He is not in acute distress.    Appearance: He is not ill-appearing, toxic-appearing or diaphoretic.  HENT:     Head: Normocephalic and  atraumatic.  Eyes:     General: No scleral icterus.       Right eye: No discharge.        Left eye: No discharge.  Cardiovascular:     Rate and Rhythm: Normal rate.  Pulmonary:     Effort: Pulmonary effort is normal. No respiratory distress.     Breath sounds: No stridor.  Abdominal:     General: Abdomen is flat. Bowel sounds are normal. There is no distension. There are no signs of injury.     Palpations: Abdomen is soft. There is no mass or pulsatile mass.     Tenderness: There is no abdominal tenderness. There is no guarding or rebound.     Hernia: There is no hernia in the umbilical area or ventral area.  Genitourinary:    Pubic Area: No rash or pubic lice.      Penis: Circumcised. No erythema, tenderness, discharge, swelling or lesions.      Testes: Normal. Cremasteric reflex is present.  Epididymis:     Right: Normal.     Left: Normal.     Rectum: Guaiac result positive. Tenderness present. No mass, anal fissure, external hemorrhoid or internal hemorrhoid. Normal anal tone.       Comments: Induration to bilateral buttocks surrounding rectum  1 cm abscess with fluctuance to 3 o'clock position on rectum  Small wound noted to left side of perineum, purulent drainage noted from this wound  All areas around rectum are exquisitely tender  No blood or melena was noted on gloved hand after rectal exam. Musculoskeletal:     Cervical back: Normal range of motion and neck supple. No edema, erythema, signs of trauma, rigidity or torticollis. No pain with movement. Normal range of motion.     Right lower leg: No edema.     Left lower leg: No edema.  Skin:    General: Skin is warm and dry.     Coloration: Skin is not jaundiced or pale.  Neurological:     General: No focal deficit present.     Mental Status: He is alert.     GCS: GCS eye subscore is 4. GCS verbal subscore is 5. GCS motor subscore is 6.  Psychiatric:        Behavior: Behavior is cooperative.     ED Results  / Procedures / Treatments   Labs (all labs ordered are listed, but only abnormal results are displayed) Labs Reviewed  CBC WITH DIFFERENTIAL/PLATELET - Abnormal; Notable for the following components:      Result Value   WBC 3.5 (*)    RBC 3.31 (*)    Hemoglobin 10.4 (*)    HCT 31.7 (*)    RDW 16.9 (*)    Neutro Abs 1.5 (*)    All other components within normal limits  COMPREHENSIVE METABOLIC PANEL - Abnormal; Notable for the following components:   Glucose, Bld 107 (*)    Calcium 8.8 (*)    Albumin 3.4 (*)    AST 14 (*)    All other components within normal limits  POC OCCULT BLOOD, ED - Abnormal; Notable for the following components:   Fecal Occult Bld POSITIVE (*)    All other components within normal limits  POC OCCULT BLOOD, ED    EKG None  Radiology CT PELVIS W CONTRAST  Result Date: 04/18/2020 CLINICAL DATA:  Perirectal abscess.  Lymphoma, recent chemotherapy. EXAM: CT PELVIS WITH CONTRAST TECHNIQUE: Multidetector CT imaging of the pelvis was performed using the standard protocol following the bolus administration of intravenous contrast. CONTRAST:  136mL OMNIPAQUE IOHEXOL 300 MG/ML  SOLN COMPARISON:  Multiple exams, including PET-CT from 02/24/2020 and CT pelvis 10/21/2019 FINDINGS: Urinary Tract:  Unremarkable Bowel: Ill definition of perianal soft tissues, probably inflammatory. Potential fluid collections bilaterally in the ischiorectal fossa, measuring about 2.6 by 1.0 by 2.0 cm (volume = 2.7 cm^3) on the left and about 1.6 by 0.9 by 1.5 cm (volume = 1 cm^3) on the right on image 40 of series 2. Abnormal bandlike stranding extends from the external sphincter region at the 3 o'clock position into the subcutaneous tissues of the left medial buttock on images 13 through 16 of series 10, and appears more prominent than on 10/21/2019. Although I not see definite gas density tracking in this region, I cannot exclude a perianal fistula. Vascular/Lymphatic: Aortoiliac  atherosclerotic vascular disease. Substantially reduced inguinal adenopathy compared to 10/21/2019 and 02/24/2020, a right inguinal lymph node measures 1.0 cm in short axis on image 31 of  series 2, previously 2.0 cm on 02/24/2020. Reproductive:  Unremarkable Other:  No supplemental non-categorized findings. Musculoskeletal: Prominent arthropathy of both hips with prominent spurring and moderate to prominent loss of articular cartilage. Extensive degenerative subcortical cyst formation along the femoral heads and acetabula. IMPRESSION: 1. Small fluid density collections along the ischiorectal fossa bilaterally suspicious for small abscesses. Ill definition of tissue planes between the anus and external sphincter probably from inflammation. Increased confluence of a band of density extending from about the 3 o'clock position of the left anal region into the subcutaneous tissues along the medial left buttock, this could be an inflammatory band but perianal fistula is not excluded. Clinically warranted, perianal fistula protocol MRI could be utilized for further characterization of the region. 2.  Aortic Atherosclerosis (ICD10-I70.0). 3. Substantially reduced pelvic adenopathy compared to 02/24/2020 PET-CT 4. Prominent arthropathy of both hips. Electronically Signed   By: Van Clines M.D.   On: 04/18/2020 10:27   MR PELVIS W WO CONTRAST  Result Date: 04/18/2020 CLINICAL DATA:  Perianal abscess, with increased rectal pain. Currently undergoing chemotherapy for T-cell lymphoma EXAM: MRI PELVIS WITHOUT AND WITH CONTRAST TECHNIQUE: Multiplanar multisequence MR imaging of the pelvis was performed both before and after administration of intravenous contrast. CONTRAST:  41mL GADAVIST GADOBUTROL 1 MMOL/ML IV SOLN COMPARISON:  PET-CT on 02/24/2020 FINDINGS: Lower Urinary Tract: Unremarkable urinary bladder. Bowel: A complex transsphincteric fistula is seen which arises from the left posterior wall of the upper anal  canal, near the 4 o'clock position (image 22/6). This becomes complex with branching tracts which extend posteriorly and laterally into the bilateral ischioanal fossae. These also show mild bilateral supralevator extension. Associated abscesses are seen in the left lower ischiorectal fossa measuring 3.5 x 1.7 cm, and in the right upper ischiorectal fossa measuring 2.2 x 1.5 cm. A 2nd simple intersphincteric fistula is seen arising from the posterior wall of the inferior anal canal at the 5 o'clock position (image 26/6). This fistula extends posteriorly and inferiorly into the gluteal crease. No associated abscess. Vascular/Lymphatic: Bilateral inguinal lymphadenopathy is incompletely visualized on this exam, but appears decreased compared to recent PET-CT. Reproductive: Unremarkable appearance of prostate and seminal vesicles. Other: None. Musculoskeletal: No significant abnormality identified. 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 (Grade 5) 2nd simple intersphincteric fistula, which extends posteriorly and inferiorly into the gluteal crease. No associated abscess. (Grade 1) Bilateral inguinal lymphadenopathy is incompletely visualized, but appears decreased compared to recent PET-CT. Electronically Signed   By: Marlaine Hind M.D.   On: 04/18/2020 14:38    Procedures Procedures   Medications Ordered in ED Medications - No data to display  ED Course  I have reviewed the triage vital signs and the nursing notes.  Pertinent labs & imaging results that were available during my care of the patient were reviewed by me and considered in my medical decision making (see chart for details).  Clinical Course as of 04/18/20 1708  Wed Apr 18, 2020  1608 WBC(!): 3.5 [JS]    Clinical Course User Index [JS] Janeece Fitting, PA-C   MDM Rules/Calculators/A&P                          Alert 55 year old male no acute  distress, nontoxic appearing.  Upon initial assessment patient is laying in left lateral position due to complaints of pain.  Patient presents with chief complaint of perirectal abscesses.  Patient has a history of  the same previously has required surgical intervention.  Patient is actively treating for lymphoma with last chemotherapy infusion on 04/05/2020.  Patient reports that he normally develops perirectal abscesses after chemotherapy however they spontaneously resolve after few days.  Patient presents today due to worsening abscesses and pain.    Patient denies any fever, chills, pain with defecation, blood in stool, melena, diarrhea, constipation, abdominal pain, nausea, vomiting, dysuria, hematuria, urinary frequency, genital swelling, genital sores or lesions, penile discharge.  On physical exam patient noted to have induration to bilateral buttocks surrounding rectum, 1 cm abscess with fluctuance to 3 o'clock position on rectum, small wound to left side of perineum, no drainage noted.  All areas around the rectum are exquisitely tender.  No mass, tenderness, swelling, erythema, or rash noted to patient's scrotum.    We will obtain CBC, CMP, Hemoccult as well as contrast CT scan of pelvis.  Hemoccult positive however no blood or melena was seen on gloved hand after rectal exam.  Patient denies any blood in stool, or melena.  Low suspicion for GI bleed at this time.  CBC shows hematocrit 31.7 however this is improved from 13 days prior when hemoglobin was 10.2 Hematocrit 30.4.  Leukopenia noted at 3.5 likely due to his recent chemotherapy treatment on 3/17.    CMP is unremarkable.  CT pelvis shows fluid collections bilaterally 2.6 by 1.0 by 2.0 cm (volume = 2.7 cm^3) on the left and about 1.6 by 0.9 by 1.5 cm (volume = 1cm^3).  Ill definition of tissue planes between the anus and external sphincter probably from inflammation. Increased confluence of a band of density extending from about the 3  o'clock position of the left anal region into the subcutaneous tissues along the medial left buttock, this could be an inflammatory band but perianal fistula is not excluded. Perianal fistula protocol MRI was recommended.  Spoke to patient about these findings and he is agreeable with obtaining an MRI at this time.   MRI shows complex drains sphincteric perianal fistula with bilateral perianal abscesses and mild bilateral supralevator extension.  Second simple anterior sphincteric fistula which extends posteriorly and inferiorly into the gluteal crease, no associated abscess.  Due to this finding we will consult general surgery.   15:30 Spoke to PA will Effie with general surgery advised surgical team will come to see the patient.  After assessment Dr. Windle Guard with general surgery recommended admission for antibiotics.  Request that patient be admitted by hospitalist; they will remain on as consult.  We will defer antibiotic regimen to hospitalist team.    Patient care transferred to [facility] at the end of my shift. Patient presentation, ED course, and plan of care discussed with review of all pertinent labs and imaging. Please see his/her note for further details regarding further ED course and disposition.  Final Clinical Impression(s) / ED Diagnoses Final diagnoses:  Perianal fistula  Abscess    Rx / DC Orders ED Discharge Orders    None       Loni Beckwith, PA-C 04/18/20 1709    Lorelle Gibbs, DO 04/19/20 1024

## 2020-04-18 NOTE — ED Notes (Signed)
ED TO INPATIENT HANDOFF REPORT  Name/Age/Gender Tanner Gallagher 55 y.o. male  Code Status Code Status History    Date Active Date Inactive Code Status Order ID Comments User Context   06/16/2017 1630 06/19/2017 1455 Full Code [id number]  Earnstine Regal, PA-C ED   Advance Care Planning Activity    Questions for Most Recent Historical Code Status (Order [id number])       Home/SNF/Other Home  Chief Complaint Perirectal abscess [K61.1]  Level of Care/Admitting Diagnosis ED Disposition    ED Disposition Condition Comment   [facility] Hospital Area: [facility] [[id number]]  Level of Care: Med-Surg [16]  Covid Evaluation: Asymptomatic Screening Protocol (No Symptoms)  Diagnosis: Perirectal abscess [546270]  Admitting Physician: Eugenie Filler [[id number]]  Attending Physician: Eugenie Filler [[id number]]       Medical History Past Medical History:  Diagnosis Date  . Cancer (HCC)    lymph nodes  . Chronic lower back pain 15 years  . Medical history non-contributory   . Tooth pain    no abscess or drainage per pt 02-01-2020    Allergies No Known Allergies  IV Location/Drains/Wounds Patient Lines/Drains/Airways Status    Active Line/Drains/Airways    Name Placement date Placement time Site Days   Implanted Port 02/15/20 Right Chest 02/15/20  0952  Chest  63   Peripheral IV 04/18/20 Right Antecubital 04/18/20  0755  Antecubital  less than 1   Open Drain 1 Rectal  06/16/17  1755  Rectal  1037   Incision (Closed) 06/16/17 Rectum 06/16/17  1811  -- 1037   Incision (Closed) 02/02/20 Groin Right 02/02/20  1235  -- 76          Labs/Imaging Results for orders placed or performed during the hospital encounter of 04/18/20 (from the past 48 hour(s))  POC occult blood, ED     Status: Abnormal   Collection Time: 04/18/20  7:02 AM  Result Value Ref Range   Fecal Occult Bld POSITIVE (A) NEGATIVE  CBC with Differential     Status: Abnormal   Collection Time: 04/18/20   7:43 AM  Result Value Ref Range   WBC 3.5 (L) 4.0 - 10.5 K/uL   RBC 3.31 (L) 4.22 - 5.81 MIL/uL   Hemoglobin 10.4 (L) 13.0 - 17.0 g/dL   HCT 31.7 (L) 39.0 - 52.0 %   MCV 95.8 80.0 - 100.0 fL   MCH 31.4 26.0 - 34.0 pg   MCHC 32.8 30.0 - 36.0 g/dL   RDW 16.9 (H) 11.5 - 15.5 %   Platelets 219 150 - 400 K/uL   nRBC 0.0 0.0 - 0.2 %   Neutrophils Relative % 42 %   Neutro Abs 1.5 (L) 1.7 - 7.7 K/uL   Lymphocytes Relative 28 %   Lymphs Abs 1.0 0.7 - 4.0 K/uL   Monocytes Relative 26 %   Monocytes Absolute 0.9 0.1 - 1.0 K/uL   Eosinophils Relative 2 %   Eosinophils Absolute 0.1 0.0 - 0.5 K/uL   Basophils Relative 2 %   Basophils Absolute 0.1 0.0 - 0.1 K/uL   Immature Granulocytes 0 %   Abs Immature Granulocytes 0.01 0.00 - 0.07 K/uL   Reactive, Benign Lymphocytes PRESENT     Comment: Performed at [facility], [place] [address], [place], [place] [address]  Comprehensive metabolic panel     Status: Abnormal   Collection Time: 04/18/20  7:43 AM  Result Value Ref Range   Sodium 137 135 - 145 mmol/L  Potassium 4.4 3.5 - 5.1 mmol/L   Chloride 104 98 - 111 mmol/L   CO2 26 22 - 32 mmol/L   Glucose, Bld 107 (H) 70 - 99 mg/dL    Comment: Glucose reference range applies only to samples taken after fasting for at least 8 hours.   BUN 20 6 - 20 mg/dL   Creatinine, Ser 0.84 0.61 - 1.24 mg/dL   Calcium 8.8 (L) 8.9 - 10.3 mg/dL   Total Protein 6.8 6.5 - 8.1 g/dL   Albumin 3.4 (L) 3.5 - 5.0 g/dL   AST 14 (L) 15 - 41 U/L   ALT 16 0 - 44 U/L   Alkaline Phosphatase 47 38 - 126 U/L   Total Bilirubin 0.6 0.3 - 1.2 mg/dL   GFR, Estimated >60 >60 mL/min    Comment: (NOTE) Calculated using the CKD-EPI Creatinine Equation (2021)    Anion gap 7 5 - 15    Comment: Performed at [facility], [place] [address], [place], [place] [address]   CT PELVIS W CONTRAST  Result Date: 04/18/2020 CLINICAL DATA:  Perirectal abscess.  Lymphoma, recent chemotherapy. EXAM: CT PELVIS  WITH CONTRAST TECHNIQUE: Multidetector CT imaging of the pelvis was performed using the standard protocol following the bolus administration of intravenous contrast. CONTRAST:  153mL OMNIPAQUE IOHEXOL 300 MG/ML  SOLN COMPARISON:  Multiple exams, including PET-CT from 02/24/2020 and CT pelvis 10/21/2019 FINDINGS: Urinary Tract:  Unremarkable Bowel: Ill definition of perianal soft tissues, probably inflammatory. Potential fluid collections bilaterally in the ischiorectal fossa, measuring about 2.6 by 1.0 by 2.0 cm (volume = 2.7 cm^3) on the left and about 1.6 by 0.9 by 1.5 cm (volume = 1 cm^3) on the right on image 40 of series 2. Abnormal bandlike stranding extends from the external sphincter region at the 3 o'clock position into the subcutaneous tissues of the left medial buttock on images 13 through 16 of series 10, and appears more prominent than on 10/21/2019. Although I not see definite gas density tracking in this region, I cannot exclude a perianal fistula. Vascular/Lymphatic: Aortoiliac atherosclerotic vascular disease. Substantially reduced inguinal adenopathy compared to 10/21/2019 and 02/24/2020, a right inguinal lymph node measures 1.0 cm in short axis on image 31 of series 2, previously 2.0 cm on 02/24/2020. Reproductive:  Unremarkable Other:  No supplemental non-categorized findings. Musculoskeletal: Prominent arthropathy of both hips with prominent spurring and moderate to prominent loss of articular cartilage. Extensive degenerative subcortical cyst formation along the femoral heads and acetabula. IMPRESSION: 1. Small fluid density collections along the ischiorectal fossa bilaterally suspicious for small abscesses. Ill definition of tissue planes between the anus and external sphincter probably from inflammation. Increased confluence of a band of density extending from about the 3 o'clock position of the left anal region into the subcutaneous tissues along the medial left buttock, this could be an  inflammatory band but perianal fistula is not excluded. Clinically warranted, perianal fistula protocol MRI could be utilized for further characterization of the region. 2.  Aortic Atherosclerosis (ICD10-I70.0). 3. Substantially reduced pelvic adenopathy compared to 02/24/2020 PET-CT 4. Prominent arthropathy of both hips. Electronically Signed   By: Van Clines M.D.   On: 04/18/2020 10:27   MR PELVIS W WO CONTRAST  Result Date: 04/18/2020 CLINICAL DATA:  Perianal abscess, with increased rectal pain. Currently undergoing chemotherapy for T-cell lymphoma EXAM: MRI PELVIS WITHOUT AND WITH CONTRAST TECHNIQUE: Multiplanar multisequence MR imaging of the pelvis was performed both before and after administration of intravenous contrast. CONTRAST:  78mL  GADAVIST GADOBUTROL 1 MMOL/ML IV SOLN COMPARISON:  PET-CT on 02/24/2020 FINDINGS: Lower Urinary Tract: Unremarkable urinary bladder. Bowel: A complex transsphincteric fistula is seen which arises from the left posterior wall of the upper anal canal, near the 4 o'clock position (image 22/6). This becomes complex with branching tracts which extend posteriorly and laterally into the bilateral ischioanal fossae. These also show mild bilateral supralevator extension. Associated abscesses are seen in the left lower ischiorectal fossa measuring 3.5 x 1.7 cm, and in the right upper ischiorectal fossa measuring 2.2 x 1.5 cm. A 2nd simple intersphincteric fistula is seen arising from the posterior wall of the inferior anal canal at the 5 o'clock position (image 26/6). This fistula extends posteriorly and inferiorly into the gluteal crease. No associated abscess. Vascular/Lymphatic: Bilateral inguinal lymphadenopathy is incompletely visualized on this exam, but appears decreased compared to recent PET-CT. Reproductive: Unremarkable appearance of prostate and seminal vesicles. Other: None. Musculoskeletal: No significant abnormality identified. 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 (Grade 5) 2nd simple intersphincteric fistula, which extends posteriorly and inferiorly into the gluteal crease. No associated abscess. (Grade 1) Bilateral inguinal lymphadenopathy is incompletely visualized, but appears decreased compared to recent PET-CT. Electronically Signed   By: Marlaine Hind M.D.   On: 04/18/2020 14:38    Pending Labs Unresulted Labs (From admission, onward)          Start     Ordered   04/18/20 1612  Resp Panel by RT-PCR (Flu A&B, Covid) Nasopharyngeal Swab  (Tier 2 - Symptomatic/asymptomatic with Precautions )  Once,   STAT       Question Answer Comment  Is this test for diagnosis or screening Screening   Symptomatic for COVID-19 as defined by CDC No   Hospitalized for COVID-19 No   Admitted to ICU for COVID-19 No   Previously tested for COVID-19 No   Resident in a congregate (group) care setting No   Employed in healthcare setting No   Has patient completed COVID vaccination(s) (2 doses of Pfizer/Moderna 1 dose of [employer]) Unknown      04/18/20 1612          Vitals/Pain Today's Vitals   04/18/20 1422 04/18/20 1445 04/18/20 1545 04/18/20 1627  BP: 108/71 120/76 128/83 116/79  Pulse: 78 85 84 79  Resp: 18 18 18    Temp: 98.1 F (36.7 C)     TempSrc: Oral     SpO2: 100% 98% 98% 98%  Weight:      Height:      PainSc: 4        Isolation Precautions No active isolations  Medications Medications  lidocaine-EPINEPHrine (XYLOCAINE W/EPI) 2 %-1:200000 (PF) injection 20 mL ( Infiltration Canceled Entry 04/18/20 1528)  piperacillin-tazobactam (ZOSYN) IVPB 3.375 g (has no administration in time range)  acetaminophen (TYLENOL) tablet 1,000 mg (1,000 mg Oral Given 04/18/20 0751)  iohexol (OMNIPAQUE) 300 MG/ML solution 100 mL (100 mLs Intravenous Contrast Given 04/18/20 0930)  0.9 %  sodium chloride infusion ( Intravenous Stopped 04/18/20  1319)  gadobutrol (GADAVIST) 1 MMOL/ML injection 6 mL (6 mLs Intravenous Contrast Given 04/18/20 1356)  Tdap (BOOSTRIX) injection 0.5 mL (0.5 mLs Intramuscular Given 04/18/20 1631)  piperacillin-tazobactam (ZOSYN) IVPB 3.375 g (3.375 g Intravenous New Bag/Given 04/18/20 1632)    Mobility walks

## 2020-04-18 NOTE — ED Triage Notes (Signed)
Pt c/o perirectal abscess that has gotten more painful. States he usually gets them after chemo and they go away but this one has increased in size. Denies fevers. Last chemo approx 2 wks ago

## 2020-04-19 ENCOUNTER — Other Ambulatory Visit: Payer: Self-pay

## 2020-04-19 DIAGNOSIS — L0291 Cutaneous abscess, unspecified: Secondary | ICD-10-CM

## 2020-04-19 DIAGNOSIS — K603 Anal fistula: Secondary | ICD-10-CM

## 2020-04-19 DIAGNOSIS — K610 Anal abscess: Secondary | ICD-10-CM

## 2020-04-19 LAB — VITAMIN B12: Vitamin B-12: 560 pg/mL (ref 180–914)

## 2020-04-19 LAB — CBC WITH DIFFERENTIAL/PLATELET
Abs Immature Granulocytes: 0 10*3/uL (ref 0.00–0.07)
Basophils Absolute: 0.1 10*3/uL (ref 0.0–0.1)
Basophils Relative: 2 %
Eosinophils Absolute: 0.1 10*3/uL (ref 0.0–0.5)
Eosinophils Relative: 2 %
HCT: 32.3 % — ABNORMAL LOW (ref 39.0–52.0)
Hemoglobin: 10.6 g/dL — ABNORMAL LOW (ref 13.0–17.0)
Immature Granulocytes: 0 %
Lymphocytes Relative: 36 %
Lymphs Abs: 1.3 10*3/uL (ref 0.7–4.0)
MCH: 31.5 pg (ref 26.0–34.0)
MCHC: 32.8 g/dL (ref 30.0–36.0)
MCV: 95.8 fL (ref 80.0–100.0)
Monocytes Absolute: 1 10*3/uL (ref 0.1–1.0)
Monocytes Relative: 26 %
Neutro Abs: 1.2 10*3/uL — ABNORMAL LOW (ref 1.7–7.7)
Neutrophils Relative %: 34 %
Platelets: 254 10*3/uL (ref 150–400)
RBC: 3.37 MIL/uL — ABNORMAL LOW (ref 4.22–5.81)
RDW: 16.6 % — ABNORMAL HIGH (ref 11.5–15.5)
WBC: 3.7 10*3/uL — ABNORMAL LOW (ref 4.0–10.5)
nRBC: 0 % (ref 0.0–0.2)

## 2020-04-19 LAB — IRON AND TIBC
Iron: 27 ug/dL — ABNORMAL LOW (ref 45–182)
Saturation Ratios: 12 % — ABNORMAL LOW (ref 17.9–39.5)
TIBC: 224 ug/dL — ABNORMAL LOW (ref 250–450)
UIBC: 197 ug/dL

## 2020-04-19 LAB — COMPREHENSIVE METABOLIC PANEL
ALT: 13 U/L (ref 0–44)
AST: 15 U/L (ref 15–41)
Albumin: 3.2 g/dL — ABNORMAL LOW (ref 3.5–5.0)
Alkaline Phosphatase: 43 U/L (ref 38–126)
Anion gap: 9 (ref 5–15)
BUN: 17 mg/dL (ref 6–20)
CO2: 24 mmol/L (ref 22–32)
Calcium: 8.8 mg/dL — ABNORMAL LOW (ref 8.9–10.3)
Chloride: 104 mmol/L (ref 98–111)
Creatinine, Ser: 1.03 mg/dL (ref 0.61–1.24)
GFR, Estimated: >60 mL/min (ref >60)
Glucose, Bld: 90 mg/dL (ref 70–99)
Potassium: 4.3 mmol/L (ref 3.5–5.1)
Sodium: 137 mmol/L (ref 135–145)
Total Bilirubin: 0.7 mg/dL (ref 0.3–1.2)
Total Protein: 6.7 g/dL (ref 6.5–8.1)

## 2020-04-19 LAB — FOLATE: Folate: 17.5 ng/mL (ref >5.9)

## 2020-04-19 LAB — MAGNESIUM: Magnesium: 2.2 mg/dL (ref 1.7–2.4)

## 2020-04-19 LAB — FERRITIN: Ferritin: 411 ng/mL — ABNORMAL HIGH (ref 24–336)

## 2020-04-19 LAB — PHOSPHORUS: Phosphorus: 4.2 mg/dL (ref 2.5–4.6)

## 2020-04-19 LAB — GLUCOSE, CAPILLARY: Glucose-Capillary: 102 mg/dL — ABNORMAL HIGH (ref 70–99)

## 2020-04-19 MED ORDER — ENOXAPARIN SODIUM 40 MG/0.4ML ~~LOC~~ SOLN
40.0000 mg | Freq: Every day | SUBCUTANEOUS | Status: DC
  Administered 2020-04-19: 40 mg via SUBCUTANEOUS
  Filled 2020-04-19: qty 0.4

## 2020-04-19 NOTE — Progress Notes (Signed)
Subjective/Chief Complaint: Feels much better. Pain significantly improved. Ongoing drainage.    Objective: Vital signs in last 24 hours: Temp:  [97.6 F (36.4 C)-98.9 F (37.2 C)] 98.2 F (36.8 C) (03/31 0600) Pulse Rate:  [62-85] 76 (03/31 0600) Resp:  [16-20] 20 (03/31 0600) BP: (90-149)/(54-125) 111/72 (03/31 0600) SpO2:  [98 %-100 %] 100 % (03/31 0600) Weight:  [63.1 kg] 63.1 kg (03/31 0500) Last BM Date: 04/18/20  Intake/Output from previous day: 03/30 0701 - 03/31 0700 In: 814.9 [I.V.:763.2; IV Piggyback:51.7] Out: -  Intake/Output this shift: No intake/output data recorded.  General appearance: alert and cooperative Resp: unlabored Cardio: regular rate and rhythm GI: soft, non-tender; bowel sounds normal; no masses,  no organomegaly Extremities: extremities normal, atraumatic, no cyanosis or edema Skin: Skin color, texture, turgor normal. No rashes or lesions Ongoing induration and tenderness to bilateral perianal region, L>R. No fluctuance. Scant purulent drainage  Lab Results:  Recent Labs    04/18/20 0743 04/19/20 0250  WBC 3.5* 3.7*  HGB 10.4* 10.6*  HCT 31.7* 32.3*  PLT 219 254   BMET Recent Labs    04/18/20 0743 04/19/20 0250  NA 137 137  K 4.4 4.3  CL 104 104  CO2 26 24  GLUCOSE 107* 90  BUN 20 17  CREATININE 0.84 1.03  CALCIUM 8.8* 8.8*   PT/INR No results for input(s): LABPROT, INR in the last 72 hours. ABG No results for input(s): PHART, HCO3 in the last 72 hours.  Invalid input(s): PCO2, PO2  Studies/Results: CT PELVIS W CONTRAST  Result Date: 04/18/2020 CLINICAL DATA:  Perirectal abscess.  Lymphoma, recent chemotherapy. EXAM: CT PELVIS WITH CONTRAST TECHNIQUE: Multidetector CT imaging of the pelvis was performed using the standard protocol following the bolus administration of intravenous contrast. CONTRAST:  139mL OMNIPAQUE IOHEXOL 300 MG/ML  SOLN COMPARISON:  Multiple exams, including PET-CT from 02/24/2020 and CT pelvis  10/21/2019 FINDINGS: Urinary Tract:  Unremarkable Bowel: Ill definition of perianal soft tissues, probably inflammatory. Potential fluid collections bilaterally in the ischiorectal fossa, measuring about 2.6 by 1.0 by 2.0 cm (volume = 2.7 cm^3) on the left and about 1.6 by 0.9 by 1.5 cm (volume = 1 cm^3) on the right on image 40 of series 2. Abnormal bandlike stranding extends from the external sphincter region at the 3 o'clock position into the subcutaneous tissues of the left medial buttock on images 13 through 16 of series 10, and appears more prominent than on 10/21/2019. Although I not see definite gas density tracking in this region, I cannot exclude a perianal fistula. Vascular/Lymphatic: Aortoiliac atherosclerotic vascular disease. Substantially reduced inguinal adenopathy compared to 10/21/2019 and 02/24/2020, a right inguinal lymph node measures 1.0 cm in short axis on image 31 of series 2, previously 2.0 cm on 02/24/2020. Reproductive:  Unremarkable Other:  No supplemental non-categorized findings. Musculoskeletal: Prominent arthropathy of both hips with prominent spurring and moderate to prominent loss of articular cartilage. Extensive degenerative subcortical cyst formation along the femoral heads and acetabula. IMPRESSION: 1. Small fluid density collections along the ischiorectal fossa bilaterally suspicious for small abscesses. Ill definition of tissue planes between the anus and external sphincter probably from inflammation. Increased confluence of a band of density extending from about the 3 o'clock position of the left anal region into the subcutaneous tissues along the medial left buttock, this could be an inflammatory band but perianal fistula is not excluded. Clinically warranted, perianal fistula protocol MRI could be utilized for further characterization of the region. 2.  Aortic Atherosclerosis (  ICD10-I70.0). 3. Substantially reduced pelvic adenopathy compared to 02/24/2020 PET-CT 4.  Prominent arthropathy of both hips. Electronically Signed   By: Van Clines M.D.   On: 04/18/2020 10:27   MR PELVIS W WO CONTRAST  Result Date: 04/18/2020 CLINICAL DATA:  Perianal abscess, with increased rectal pain. Currently undergoing chemotherapy for T-cell lymphoma EXAM: MRI PELVIS WITHOUT AND WITH CONTRAST TECHNIQUE: Multiplanar multisequence MR imaging of the pelvis was performed both before and after administration of intravenous contrast. CONTRAST:  70mL GADAVIST GADOBUTROL 1 MMOL/ML IV SOLN COMPARISON:  PET-CT on 02/24/2020 FINDINGS: Lower Urinary Tract: Unremarkable urinary bladder. Bowel: A complex transsphincteric fistula is seen which arises from the left posterior wall of the upper anal canal, near the 4 o'clock position (image 22/6). This becomes complex with branching tracts which extend posteriorly and laterally into the bilateral ischioanal fossae. These also show mild bilateral supralevator extension. Associated abscesses are seen in the left lower ischiorectal fossa measuring 3.5 x 1.7 cm, and in the right upper ischiorectal fossa measuring 2.2 x 1.5 cm. A 2nd simple intersphincteric fistula is seen arising from the posterior wall of the inferior anal canal at the 5 o'clock position (image 26/6). This fistula extends posteriorly and inferiorly into the gluteal crease. No associated abscess. Vascular/Lymphatic: Bilateral inguinal lymphadenopathy is incompletely visualized on this exam, but appears decreased compared to recent PET-CT. Reproductive: Unremarkable appearance of prostate and seminal vesicles. Other: None. Musculoskeletal: No significant abnormality identified. 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 (Grade 5) 2nd simple intersphincteric fistula, which extends posteriorly and inferiorly into the gluteal crease. No associated abscess. (Grade 1) Bilateral inguinal lymphadenopathy is  incompletely visualized, but appears decreased compared to recent PET-CT. Electronically Signed   By: Marlaine Hind M.D.   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   Plan -Responding well to medical therapy. Continue IV abx and sitz baths. If continuing to improve can be discharged on PO abx within the next 24h. Will advance diet.      LOS: 0 days    Clovis Riley 04/19/2020

## 2020-04-19 NOTE — Progress Notes (Signed)
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 Ninfa Linden in 2019 and gout presenting with perirectal pain and perirectal abscess with drainage, and admitted for the same.  CT pelvis and MRI showed perirectal abscess and fistula.  General surgery consulted and recommended IV antibiotics.   Subjective: Seen and examined earlier this morning.  No major events overnight of this morning.  Reports improvement in his pain.  He rates his pain 2/10.  No other complaints.  He denies chest pain, dyspnea, GI or UTI symptoms.  Objective: Vitals:   04/18/20 2200 04/19/20 0157 04/19/20 0500 04/19/20 0600  BP: 115/74 104/74  111/72  Pulse: 72 72  76  Resp: 18 18  20   Temp: 98.3 F (36.8 C) 98.3 F (36.8 C)  98.2 F (36.8 C)  TempSrc: Oral Oral  Oral  SpO2: 100% 100%  100%  Weight:   63.1 kg   Height:        Intake/Output Summary (Last 24 hours) at 04/19/2020 1159 Last data filed at 04/19/2020 0400 Gross per 24 hour  Intake 814.89 ml  Output --  Net 814.89 ml   Filed Weights   04/18/20 0613 04/19/20 0500  Weight: 62.6 kg 63.1 kg    Examination:  GENERAL: No apparent distress.  Nontoxic. HEENT: MMM.  Vision and hearing grossly intact.  NECK: Supple.  No apparent JVD.  RESP:  No IWOB.  Fair aeration bilaterally. CVS:  RRR. Heart sounds normal.  ABD/GI/GU: BS+. Abd soft, NTND.  Deferred perirectal skin exam as he was just examined by general surgery this morning. MSK/EXT:  Moves extremities. No apparent deformity. No edema.  SKIN: no apparent skin lesion or wound NEURO: Awake, alert and oriented appropriately.  No apparent focal neuro deficit. PSYCH: Calm. Normal affect.   Procedures:   None  Microbiology summarized: [id number] and influenza PCR nonreactive.  Assessment & Plan: Bilateral perirectal abscesses/perianal fistula: Had I&D 2019 for perirectal abscess.  Presented with pain, enlarging abscess and drainage.  Improved with IV antibiotics.  -General surgery following-recommended IV antibiotics and sitz bath -Possibly discharge home on p.o. antibiotics in the next 24 hours  Anaplastic large T-cell lymphoma/leukopenia: Currently on chemotherapy.  Followed by Dr. Lorenso Courier.  Last chemo cycle on 3/17.  Next chemo will be on 4/17. -Outpatient follow-up with oncology  Normocytic anemia: FOBT positive but H&H stable.  Hemoccult could be from perirectal abscess drainage. Recent Labs    10/12/19 1432 12/09/19 1453 02/08/20 1505 02/23/20 0910 03/01/20 0915 03/08/20 0911 03/15/20 1011 04/05/20 1143 04/18/20 0743 04/19/20 0250  HGB 11.6* 12.7* 12.7* 12.4* 12.6* 11.6* 12.1* 10.2* 10.4* 10.6*  -Could benefit from GI evaluation outpatient -Discontinue subcu Lovenox -SCD for VTE prophylaxis  History of gout -Continue home allopurinol   Body mass index is 21.15 kg/m.         DVT prophylaxis:  Place and maintain sequential compression device Start: 04/19/20 1159 SCDs Start: 04/18/20 1714  Code Status: Full code Family Communication: Patient and/or RN. Available if any question.  Level of care: Med-Surg Status is: Observation  The patient will require care spanning > 2 midnights and should be moved to inpatient because: IV treatments appropriate due to intensity of illness or inability to take PO and  Inpatient level of care appropriate due to severity of illness  Dispo: The patient is from: Home              Anticipated d/c is to: Home              Patient currently is not medically stable to d/c.   Difficult to place patient No       Consultants:  General surgery   Sch Meds:  Scheduled Meds: . allopurinol  300 mg Oral Daily  . pantoprazole  40  mg Oral Daily  . senna  1 tablet Oral BID  . sodium chloride flush  3 mL Intravenous Q12H   Continuous Infusions: . piperacillin-tazobactam (ZOSYN)  IV 3.375 g (04/19/20 0502)   PRN Meds:.acetaminophen **OR** acetaminophen, albuterol, hydrALAZINE, ipratropium, magnesium citrate, morphine injection, ondansetron **OR** ondansetron (ZOFRAN) IV, oxyCODONE, polyethylene glycol, sorbitol  Antimicrobials: Anti-infectives (From admission, onward)   Start     Dose/Rate Route Frequency Ordered Stop   04/18/20 2200  piperacillin-tazobactam (ZOSYN) IVPB 3.375 g        3.375 g 12.5 mL/hr over 240 Minutes Intravenous Every 8 hours 04/18/20 1608     04/18/20 1615  piperacillin-tazobactam (ZOSYN) IVPB 3.375 g        3.375 g 100 mL/hr over 30 Minutes Intravenous  Once 04/18/20 1606 04/18/20 1702       I have personally reviewed the following labs and images: CBC: Recent Labs  Lab 04/18/20 0743 04/19/20 0250  WBC 3.5* 3.7*  NEUTROABS 1.5* 1.2*  HGB 10.4* 10.6*  HCT 31.7* 32.3*  MCV 95.8 95.8  PLT 219 254   BMP &GFR Recent Labs  Lab 04/18/20 0743 04/19/20 0250  NA 137 137  K 4.4 4.3  CL 104 104  CO2 26 24  GLUCOSE 107* 90  BUN 20 17  CREATININE 0.84 1.03  CALCIUM 8.8* 8.8*  MG  --  2.2  PHOS  --  4.2   Estimated Creatinine Clearance: 73.2 mL/min (by C-G formula based on SCr of 1.03 mg/dL). Liver & Pancreas: Recent Labs  Lab 04/18/20 0743 04/19/20 0250  AST 14* 15  ALT 16 13  ALKPHOS 47 43  BILITOT 0.6 0.7  PROT 6.8 6.7  ALBUMIN 3.4* 3.2*   No results for input(s): LIPASE, AMYLASE in the last 168 hours. No results for input(s): AMMONIA in the last 168 hours. Diabetic: No results for input(s): HGBA1C in the last 72 hours. Recent Labs  Lab 04/19/20 0729  GLUCAP 102*   Cardiac Enzymes: No results for input(s): CKTOTAL, CKMB, CKMBINDEX, TROPONINI in the last 168 hours. No results for input(s): PROBNP in the last 8760 hours. Coagulation Profile: No results for  input(s): INR, PROTIME in the last 168 hours. Thyroid Function Tests: No results for input(s): TSH, T4TOTAL, FREET4, T3FREE, THYROIDAB in the last 72 hours. Lipid Profile: No results for input(s): CHOL, HDL, LDLCALC, TRIG, CHOLHDL, LDLDIRECT in the last 72 hours. Anemia Panel: Recent Labs    04/19/20 0250  VITAMINB12 560  FOLATE 17.5  FERRITIN 411*  TIBC 224*  IRON 27*   Urine analysis:    Component Value Date/Time   COLORURINE YELLOW 07/02/2019 [place] 07/02/2019 1011   LABSPEC 1.018 07/02/2019 1011   PHURINE 5.0 07/02/2019 1011   GLUCOSEU NEGATIVE 07/02/2019 [place] 07/02/2019 [place] 07/02/2019 1011   KETONESUR NEGATIVE 07/02/2019 1011   PROTEINUR NEGATIVE 07/02/2019 1011   NITRITE NEGATIVE 07/02/2019 1011   LEUKOCYTESUR  SMALL (A) 07/02/2019 1011   Sepsis Labs: Invalid input(s): PROCALCITONIN, [place]  Microbiology: Recent Results (from the past 240 hour(s))  Resp Panel by RT-PCR (Flu A&B, Covid) Nasopharyngeal Swab     Status: None   Collection Time: 04/18/20  4:45 PM   Specimen: Nasopharyngeal Swab; Nasopharyngeal(NP) swabs in vial transport medium  Result Value Ref Range Status   SARS Coronavirus 2 by RT PCR NEGATIVE NEGATIVE Final    Comment: (NOTE) SARS-CoV-2 target nucleic acids are NOT DETECTED.  The SARS-CoV-2 RNA is generally detectable in upper respiratory specimens during the acute phase of infection. The lowest concentration of SARS-CoV-2 viral copies this assay can detect is 138 copies/mL. A negative result does not preclude SARS-Cov-2 infection and should not be used as the sole basis for treatment or other patient management decisions. A negative result may occur with  improper specimen collection/handling, submission of specimen other than nasopharyngeal swab, presence of viral mutation(s) within the areas targeted by this assay, and inadequate number of viral copies(<138 copies/mL). A  negative result must be combined with clinical observations, patient history, and epidemiological information. The expected result is Negative.  Fact Sheet for Patients:  EntrepreneurPulse.com.au  Fact Sheet for Healthcare Providers:  IncredibleEmployment.be  This test is no t yet approved or cleared by the [place] FDA and  has been authorized for detection and/or diagnosis of SARS-CoV-2 by FDA under an Emergency Use Authorization (EUA). This EUA will remain  in effect (meaning this test can be used) for the duration of the COVID-19 declaration under Section 564(b)(1) of the Act, 21 U.S.C.section 360bbb-3(b)(1), unless the authorization is terminated  or revoked sooner.       Influenza A by PCR NEGATIVE NEGATIVE Final   Influenza B by PCR NEGATIVE NEGATIVE Final    Comment: (NOTE) The Xpert Xpress SARS-CoV-2/FLU/RSV plus assay is intended as an aid in the diagnosis of influenza from Nasopharyngeal swab specimens and should not be used as a sole basis for treatment. Nasal washings and aspirates are unacceptable for Xpert Xpress SARS-CoV-2/FLU/RSV testing.  Fact Sheet for Patients: EntrepreneurPulse.com.au  Fact Sheet for Healthcare Providers: IncredibleEmployment.be  This test is not yet approved or cleared by the [place] FDA and has been authorized for detection and/or diagnosis of SARS-CoV-2 by FDA under an Emergency Use Authorization (EUA). This EUA will remain in effect (meaning this test can be used) for the duration of the COVID-19 declaration under Section 564(b)(1) of the Act, 21 U.S.C. section 360bbb-3(b)(1), unless the authorization is terminated or revoked.  Performed at [facility], [place] [address], [place], [place] [address]     Radiology Studies: MR PELVIS W WO CONTRAST  Result Date: 04/18/2020 CLINICAL DATA:  Perianal abscess, with increased rectal  pain. Currently undergoing chemotherapy for T-cell lymphoma EXAM: MRI PELVIS WITHOUT AND WITH CONTRAST TECHNIQUE: Multiplanar multisequence MR imaging of the pelvis was performed both before and after administration of intravenous contrast. CONTRAST:  78mL GADAVIST GADOBUTROL 1 MMOL/ML IV SOLN COMPARISON:  PET-CT on 02/24/2020 FINDINGS: Lower Urinary Tract: Unremarkable urinary bladder. Bowel: A complex transsphincteric fistula is seen which arises from the left posterior wall of the upper anal canal, near the 4 o'clock position (image 22/6). This becomes complex with branching tracts which extend posteriorly and laterally into the bilateral ischioanal fossae. These also show mild bilateral supralevator extension. Associated abscesses are seen in the left lower ischiorectal fossa measuring 3.5 x 1.7 cm, and in the right upper ischiorectal fossa measuring 2.2 x 1.5 cm. A  2nd simple intersphincteric fistula is seen arising from the posterior wall of the inferior anal canal at the 5 o'clock position (image 26/6). This fistula extends posteriorly and inferiorly into the gluteal crease. No associated abscess. Vascular/Lymphatic: Bilateral inguinal lymphadenopathy is incompletely visualized on this exam, but appears decreased compared to recent PET-CT. Reproductive: Unremarkable appearance of prostate and seminal vesicles. Other: None. Musculoskeletal: No significant abnormality identified. 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 (Grade 5) 2nd simple intersphincteric fistula, which extends posteriorly and inferiorly into the gluteal crease. No associated abscess. (Grade 1) Bilateral inguinal lymphadenopathy is incompletely visualized, but appears decreased compared to recent PET-CT. Electronically Signed   By: Marlaine Hind M.D.   On: 04/18/2020 14:38      Tanner Gallagher  If 7PM-7AM, please contact  night-coverage [email or web address] 04/19/2020, 11:59 AM

## 2020-04-19 NOTE — Discharge Instructions (Signed)
Anal Fistula  An anal fistula is a hole that develops between the bowel and the skin near the anus. The anus allows stool (feces) to leave the body. The anus has many tiny glands that make lubricating fluid. Sometimes, these glands become plugged and infected. This can cause a fluid-filled pocket (abscess) to form. An anal fistula often occurs when an abscess becomes infected and then develops into a hole between the bowel and the skin. What are the causes? In most cases, an anal fistula is caused by a past or current buildup of pus around the anus (anal abscess). Other causes include:  Using high-energy beams (radiation) to treat the area around the rectum. What increases the risk? You are more likely to develop this condition if you have certain medical conditions or diseases, including:  Chronic inflammatory bowel disease, such as Crohn's disease or ulcerative colitis.  Colon cancer or rectal cancer.  Diverticular disease, such as diverticulitis.  A sexually transmitted infection, or STI, such as gonorrhea, chlamydia, or syphilis.  An infection that is caused by HIV. What are the signs or symptoms? Symptoms of this condition include:  Throbbing or constant pain that may be worse while you are sitting.  Swelling or irritation around the anus.  Pus or blood from an opening near the anus.  Pain when passing stool.  Fever or chills. How is this diagnosed? This condition is diagnosed based on:  A physical exam. This may include: ? An exam to find the external opening of the fistula. ? An exam with a probe or scope to help locate the internal opening of the fistula. ? An exam of the rectum with a gloved hand (digital rectal exam).  Imaging tests that use dye to find the exact location and path of the fistula. Tests may include: ? X-rays. ? Ultrasound. ? CT scan. ? MRI.  Other tests to find the cause of the anal fistula. How is this treated? This condition is most commonly  treated with surgery. The type of surgery that is used will depend on where the fistula is located and how complex the fistula is. Surgery may include:  A fistulotomy. The whole fistula is opened up, and the contents are drained to promote healing.  Seton placement. A silk string (seton) is placed into the fistula during a fistulotomy. This helps to drain any infection and promote healing.  Advancement flap procedure. Tissue is removed from your rectum or the skin around the anus and attached to the opening of the fistula.  Bioprosthetic plug. A cone-shaped plug is made from your tissue and is used to block the opening of the fistula. Some anal fistulas do not require surgery. A nonsurgical treatment option involves injecting a fibrin glue to seal the fistula. You also may be prescribed an antibiotic medicine to treat any infection. Follow these instructions at home: Medicines  Take over-the-counter and prescription medicines only as told by your health care provider.  If you were prescribed an antibiotic medicine, take it as told by your health care provider. Do not stop taking the antibiotic even if you start to feel better.  Use a stool softener or a laxative if told to do so by your health care provider. General instructions  Eat a high-fiber diet as told by your health care provider. This can help to prevent constipation.  Drink enough fluid to keep your urine pale yellow.  Take a warm sitz bath for 15-20 minutes, 3-4 times per day, or as told by  your health care provider. Sitz baths can ease your pain and discomfort and help with healing.  Follow good hygiene to keep the anal area as clean and dry as possible. Use wet toilet paper or a moist towelette after each bowel movement.  Keep all follow-up visits as told by your health care provider. This is important.   Contact a health care provider if you have:  Increased pain that is not controlled with medicines.  New redness or  swelling around the anal area.  New fluid, blood, or pus coming from the anal area.  Tenderness or warmth around the anal area. Get help right away if you have:  A fever.  Severe pain.  Chills or diarrhea.  Severe problems urinating or having a bowel movement. Summary  An anal fistula is a hole that develops between the bowel and the skin near the anus.  This condition is most often caused by a buildup of pus around the anus (anal abscess). Other causes include a complication of surgery, an injury to the rectum, or the use of radiation to treat the rectal area.  This condition is most commonly treated with surgery.  Follow your health care provider's instructions about taking medicines, eating and drinking, or taking sitz baths.  Call your health care provider if you have more pain, swelling, or blood. Get help right away if you have fever, severe pain, or problems passing urine or stool. This information is not intended to replace advice given to you by your health care provider. Make sure you discuss any questions you have with your health care provider. Document Revised: 05/22/2017 Document Reviewed: 05/22/2017 Elsevier Patient Education  2021 [place].   How to Take a [employer] A sitz bath is a warm water bath that may be used to care for your rectum, genital area, or the area between your rectum and genitals (perineum). In a sitz bath, the water only comes up to your hips and covers your buttocks. A sitz bath may be done in a bathtub or with a portable sitz bath that fits over the toilet. Your health care provider may recommend a sitz bath to help:  Relieve pain and discomfort after delivering a baby.  Relieve pain and itching from hemorrhoids or anal fissures.  Relieve pain after certain surgeries.  Relax muscles that are sore or tight. How to take a sitz bath Take 3-4 sitz baths a day, or as many as told by your health care provider. Bathtub sitz bath To take a  sitz bath in a bathtub: 1. Partially fill a bathtub with warm water. The water should be deep enough to cover your hips and buttocks when you are sitting in the tub. 2. Follow your health care provider's instructions if you are told to put medicine in the water. 3. Sit in the water. Open the tub drain a little, and leave it open during your bath. 4. Turn on the warm water again, enough to replace the water that is draining out. Keep the water running throughout your bath. This helps keep the water at the right level and temperature. 5. Soak in the water for 15-20 minutes, or as long as told by your health care provider. 6. When you are done, be careful when you stand up. You may feel dizzy. 7. After the sitz bath, pat yourself dry. Do not rub your skin to dry it.   Over-the-toilet sitz bath To take a sitz bath with an over-the-toilet basin: 1. Follow the  manufacturer's instructions. 2. Fill the basin with warm water. 3. Follow your health care provider's instructions if you were told to put medicine in the water. 4. Sit on the seat. Make sure the water covers your buttocks and perineum. 5. Soak in the water for 15-20 minutes, or as long as told by your health care provider. 6. After the sitz bath, pat yourself dry. Do not rub your skin to dry it. 7. Clean and dry the basin between uses. 8. Discard the basin if it cracks, or according to the manufacturer's instructions.   Contact a health care provider if:  Your pain or itching gets worse. Do not continue with sitz baths if your symptoms get worse.  You have new symptoms. Do not continue with sitz baths until you talk with your health care provider. Summary  A sitz bath is a warm water bath in which the water only comes up to your hips and covers your buttocks.  A sitz bath may help relieve pain and discomfort after delivering a baby. It also may help with pain and itching from hemorrhoids or anal fissures, or pain after certain surgeries.  It can also help to relax muscles that are sore or tight.  Take 3-4 sitz baths a day, or as many as told by your health care provider. Soak in the water for 15-20 minutes.  Do not continue with sitz baths if your symptoms get worse. This information is not intended to replace advice given to you by your health care provider. Make sure you discuss any questions you have with your health care provider. Document Revised: 09/22/2019 Document Reviewed: 09/22/2019 Elsevier Patient Education  2021 [employer].

## 2020-04-20 ENCOUNTER — Other Ambulatory Visit (HOSPITAL_COMMUNITY): Payer: Self-pay | Admitting: Student

## 2020-04-20 LAB — RENAL FUNCTION PANEL
Albumin: 3.2 g/dL — ABNORMAL LOW (ref 3.5–5.0)
Anion gap: 7 (ref 5–15)
BUN: 13 mg/dL (ref 6–20)
CO2: 24 mmol/L (ref 22–32)
Calcium: 8.7 mg/dL — ABNORMAL LOW (ref 8.9–10.3)
Chloride: 108 mmol/L (ref 98–111)
Creatinine, Ser: 0.98 mg/dL (ref 0.61–1.24)
GFR, Estimated: >60 mL/min (ref >60)
Glucose, Bld: 118 mg/dL — ABNORMAL HIGH (ref 70–99)
Phosphorus: 3.5 mg/dL (ref 2.5–4.6)
Potassium: 4.1 mmol/L (ref 3.5–5.1)
Sodium: 139 mmol/L (ref 135–145)

## 2020-04-20 LAB — CBC WITH DIFFERENTIAL/PLATELET
Abs Immature Granulocytes: 0.01 10*3/uL (ref 0.00–0.07)
Basophils Absolute: 0.1 10*3/uL (ref 0.0–0.1)
Basophils Relative: 2 %
Eosinophils Absolute: 0.1 10*3/uL (ref 0.0–0.5)
Eosinophils Relative: 2 %
HCT: 32.2 % — ABNORMAL LOW (ref 39.0–52.0)
Hemoglobin: 10.7 g/dL — ABNORMAL LOW (ref 13.0–17.0)
Immature Granulocytes: 0 %
Lymphocytes Relative: 42 %
Lymphs Abs: 1.6 10*3/uL (ref 0.7–4.0)
MCH: 32 pg (ref 26.0–34.0)
MCHC: 33.2 g/dL (ref 30.0–36.0)
MCV: 96.4 fL (ref 80.0–100.0)
Monocytes Absolute: 1.2 10*3/uL — ABNORMAL HIGH (ref 0.1–1.0)
Monocytes Relative: 31 %
Neutro Abs: 0.9 10*3/uL — ABNORMAL LOW (ref 1.7–7.7)
Neutrophils Relative %: 23 %
Platelets: 279 10*3/uL (ref 150–400)
RBC: 3.34 MIL/uL — ABNORMAL LOW (ref 4.22–5.81)
RDW: 16.7 % — ABNORMAL HIGH (ref 11.5–15.5)
WBC: 3.8 10*3/uL — ABNORMAL LOW (ref 4.0–10.5)
nRBC: 0 % (ref 0.0–0.2)

## 2020-04-20 LAB — GLUCOSE, CAPILLARY: Glucose-Capillary: 95 mg/dL (ref 70–99)

## 2020-04-20 LAB — MAGNESIUM: Magnesium: 2.4 mg/dL (ref 1.7–2.4)

## 2020-04-20 MED ORDER — AMOXICILLIN-POT CLAVULANATE 875-125 MG PO TABS: 1.0000 | ORAL_TABLET | Freq: Two times a day (BID) | ORAL | 0 refills | Status: DC

## 2020-04-20 MED ORDER — SENNOSIDES-DOCUSATE SODIUM 8.6-50 MG PO TABS: 1.0000 | ORAL_TABLET | Freq: Two times a day (BID) | ORAL | 0 refills | Status: DC | PRN

## 2020-04-20 MED FILL — AMOX-CLAV 875-125 MG TABLET: 875-125 | 10 days supply | Qty: 20 | Fill #0

## 2020-04-20 NOTE — Discharge Summary (Signed)
Physician Discharge Summary  Tanner Gallagher GYF:[id number] DOB: [birth year] DOA: 04/18/2020  PCP: Merryl Hacker, No  Admit date: 04/18/2020 Discharge date: 04/20/2020  Admitted From: Home Disposition: Home  Recommendations for Outpatient Follow-up:  1. Follow ups as below. 2. Please obtain CBC/BMP/Mag at follow up 3. Please follow up on the following pending results: None  Home Health: None required Equipment/Devices: None required  Discharge Condition: Stable CODE STATUS: Full code   Follow-up Information    Ileana Roup, MD Follow up on 05/21/2020.   Specialties: General Surgery, Colon and Rectal Surgery Why: your appointment is at 9:10 AM.  Be at the office 30 minutes early for check in.  Bring photo ID and insurance information. 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 Ninfa Linden in 2019 and gout presenting with perirectal pain and perirectal abscess with drainage, and admitted for the same.  CT pelvis and MRI showed perirectal abscess and fistula.  General surgery consulted and recommended IV antibiotics.   Patient was started on IV Zosyn.  On the day of discharge, patient improved significantly.  Cleared for discharge by general surgery on oral antibiotics with outpatient follow-up.  He was discharged on p.o. Augmentin for 10 more days.  Outpatient follow-up with general surgery as above.  See individual problem list and imaging results below for more details.  Discharge Diagnoses:  Bilateral perirectal abscesses/perianal fistula: Had I&D 2019 for perirectal abscess.  Presented with pain, enlarging abscess and drainage.  Improved with IV Zosyn. -Discharged on p.o. Augmentin for 10 more days.  Patient to continue sitz bath at home.  Anaplastic large T-cell  lymphoma/leukopenia: Currently on chemotherapy.  Followed by Dr. Lorenso Courier.  Last chemo cycle on 3/17.  Next chemo will be on 4/17. -Outpatient follow-up with oncology  Normocytic anemia: FOBT positive but H&H stable.  Hemoccult could be from perirectal abscess drainage. Recent Labs    12/09/19 1453 02/08/20 1505 02/23/20 0910 03/01/20 0915 03/08/20 0911 03/15/20 1011 04/05/20 1143 04/18/20 0743 04/19/20 0250 04/20/20 0314  HGB 12.7* 12.7* 12.4* 12.6* 11.6* 12.1* 10.2* 10.4* 10.6* 10.7*  -Could benefit from GI evaluation outpatient -Check CBC at follow-up.  History of gout -Continue home allopurinol    Body mass index is 21.15 kg/m.            Discharge Exam: Vitals:   04/19/20 1945 04/20/20 0420  BP: 112/70 112/69  Pulse: 85 61  Resp: 16 18  Temp: 98.3 F (36.8 C) 97.8 F (36.6 C)  SpO2: 100% 100%    GENERAL: No apparent distress.  Nontoxic. HEENT: MMM.  Vision and hearing grossly intact.  NECK: Supple.  No apparent JVD.  RESP:  No IWOB.  Fair aeration bilaterally. CVS:  RRR. Heart sounds normal.  ABD/GI/GU: Bowel sounds present. Soft. Non tender.  MSK/EXT:  Moves extremities. No apparent deformity. No edema.  SKIN: no apparent skin lesion or wound NEURO: Awake, alert and oriented appropriately.  No apparent focal neuro deficit. PSYCH: Calm. Normal affect.  Discharge Instructions  Discharge Instructions    Call MD for:  extreme fatigue   Complete by: As directed    Call MD for:  redness, tenderness, or signs of infection (pain, swelling, redness, odor or green/yellow discharge around incision site)   Complete by: As directed  Call MD for:  severe uncontrolled pain   Complete by: As directed    Call MD for:  temperature >100.4   Complete by: As directed    Diet general   Complete by: As directed    Discharge instructions   Complete by: As directed    It has been a pleasure taking care of you!  You were hospitalized with perirectal abscess.   The pain and abscess improved with IV antibiotics and sitz baths.  We are discharging you on oral antibiotic to complete treatment course.  Continue sitz bath's at home.  Follow-up with your primary care doctor in 1 to 2 weeks.  Your surgeon might arrange outpatient follow-up as well.   Take care,   Increase activity slowly   Complete by: As directed    No wound care   Complete by: As directed      Allergies as of 04/20/2020   No Known Allergies     Medication List    TAKE these medications   acetaminophen 500 MG tablet Commonly known as: TYLENOL Take 1,000 mg by mouth every 6 (six) hours as needed for mild pain, fever or headache.   allopurinol 300 MG tablet Commonly known as: ZYLOPRIM Take 1 tablet (300 mg total) by mouth daily.   amoxicillin-clavulanate 875-125 MG tablet Commonly known as: Augmentin Take 1 tablet by mouth 2 (two) times daily for 10 days.   lidocaine-prilocaine cream Commonly known as: EMLA Apply 1 application topically as needed. What changed: reasons to take this   ondansetron 8 MG tablet Commonly known as: ZOFRAN Take 1 tablet (8 mg total) by mouth every 8 (eight) hours as needed for nausea or vomiting.   pantoprazole 40 MG tablet Commonly known as: Protonix Take 1 tablet (40 mg total) by mouth daily.   predniSONE 20 MG tablet Commonly known as: DELTASONE Take 5 tablets (100 mg total) by mouth daily with breakfast. Take for 5 days, starting on Day 1 of your chemotherapy   prochlorperazine 10 MG tablet Commonly known as: COMPAZINE Take 1 tablet (10 mg total) by mouth every 6 (six) hours as needed for nausea or vomiting.   senna-docusate 8.6-50 MG tablet Commonly known as: Senokot-S Take 1 tablet by mouth 2 (two) times daily between meals as needed for mild constipation.       Consultations:  General surgery  Procedures/Studies:   CT PELVIS W CONTRAST  Result Date: 04/18/2020 CLINICAL DATA:  Perirectal abscess.  Lymphoma, recent  chemotherapy. EXAM: CT PELVIS WITH CONTRAST TECHNIQUE: Multidetector CT imaging of the pelvis was performed using the standard protocol following the bolus administration of intravenous contrast. CONTRAST:  19mL OMNIPAQUE IOHEXOL 300 MG/ML  SOLN COMPARISON:  Multiple exams, including PET-CT from 02/24/2020 and CT pelvis 10/21/2019 FINDINGS: Urinary Tract:  Unremarkable Bowel: Ill definition of perianal soft tissues, probably inflammatory. Potential fluid collections bilaterally in the ischiorectal fossa, measuring about 2.6 by 1.0 by 2.0 cm (volume = 2.7 cm^3) on the left and about 1.6 by 0.9 by 1.5 cm (volume = 1 cm^3) on the right on image 40 of series 2. Abnormal bandlike stranding extends from the external sphincter region at the 3 o'clock position into the subcutaneous tissues of the left medial buttock on images 13 through 16 of series 10, and appears more prominent than on 10/21/2019. Although I not see definite gas density tracking in this region, I cannot exclude a perianal fistula. Vascular/Lymphatic: Aortoiliac atherosclerotic vascular disease. Substantially reduced inguinal adenopathy compared to 10/21/2019 and 02/24/2020,  a right inguinal lymph node measures 1.0 cm in short axis on image 31 of series 2, previously 2.0 cm on 02/24/2020. Reproductive:  Unremarkable Other:  No supplemental non-categorized findings. Musculoskeletal: Prominent arthropathy of both hips with prominent spurring and moderate to prominent loss of articular cartilage. Extensive degenerative subcortical cyst formation along the femoral heads and acetabula. IMPRESSION: 1. Small fluid density collections along the ischiorectal fossa bilaterally suspicious for small abscesses. Ill definition of tissue planes between the anus and external sphincter probably from inflammation. Increased confluence of a band of density extending from about the 3 o'clock position of the left anal region into the subcutaneous tissues along the medial left  buttock, this could be an inflammatory band but perianal fistula is not excluded. Clinically warranted, perianal fistula protocol MRI could be utilized for further characterization of the region. 2.  Aortic Atherosclerosis (ICD10-I70.0). 3. Substantially reduced pelvic adenopathy compared to 02/24/2020 PET-CT 4. Prominent arthropathy of both hips. Electronically Signed   By: Van Clines M.D.   On: 04/18/2020 10:27   MR PELVIS W WO CONTRAST  Result Date: 04/18/2020 CLINICAL DATA:  Perianal abscess, with increased rectal pain. Currently undergoing chemotherapy for T-cell lymphoma EXAM: MRI PELVIS WITHOUT AND WITH CONTRAST TECHNIQUE: Multiplanar multisequence MR imaging of the pelvis was performed both before and after administration of intravenous contrast. CONTRAST:  44mL GADAVIST GADOBUTROL 1 MMOL/ML IV SOLN COMPARISON:  PET-CT on 02/24/2020 FINDINGS: Lower Urinary Tract: Unremarkable urinary bladder. Bowel: A complex transsphincteric fistula is seen which arises from the left posterior wall of the upper anal canal, near the 4 o'clock position (image 22/6). This becomes complex with branching tracts which extend posteriorly and laterally into the bilateral ischioanal fossae. These also show mild bilateral supralevator extension. Associated abscesses are seen in the left lower ischiorectal fossa measuring 3.5 x 1.7 cm, and in the right upper ischiorectal fossa measuring 2.2 x 1.5 cm. A 2nd simple intersphincteric fistula is seen arising from the posterior wall of the inferior anal canal at the 5 o'clock position (image 26/6). This fistula extends posteriorly and inferiorly into the gluteal crease. No associated abscess. Vascular/Lymphatic: Bilateral inguinal lymphadenopathy is incompletely visualized on this exam, but appears decreased compared to recent PET-CT. Reproductive: Unremarkable appearance of prostate and seminal vesicles. Other: None. Musculoskeletal: No significant abnormality identified.  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 (Grade 5) 2nd simple intersphincteric fistula, which extends posteriorly and inferiorly into the gluteal crease. No associated abscess. (Grade 1) Bilateral inguinal lymphadenopathy is incompletely visualized, but appears decreased compared to recent PET-CT. Electronically Signed   By: Marlaine Hind M.D.   On: 04/18/2020 14:38        The results of significant diagnostics from this hospitalization (including imaging, microbiology, ancillary and laboratory) are listed below for reference.     Microbiology: Recent Results (from the past 240 hour(s))  Resp Panel by RT-PCR (Flu A&B, Covid) Nasopharyngeal Swab     Status: None   Collection Time: 04/18/20  4:45 PM   Specimen: Nasopharyngeal Swab; Nasopharyngeal(NP) swabs in vial transport medium  Result Value Ref Range Status   SARS Coronavirus 2 by RT PCR NEGATIVE NEGATIVE Final    Comment: (NOTE) SARS-CoV-2 target nucleic acids are NOT DETECTED.  The SARS-CoV-2 RNA is generally detectable in upper respiratory specimens during the acute phase of infection. The lowest concentration of SARS-CoV-2 viral copies this assay can detect is 138 copies/mL. A negative result does not preclude SARS-Cov-2 infection and should  not be used as the sole basis for treatment or other patient management decisions. A negative result may occur with  improper specimen collection/handling, submission of specimen other than nasopharyngeal swab, presence of viral mutation(s) within the areas targeted by this assay, and inadequate number of viral copies(<138 copies/mL). A negative result must be combined with clinical observations, patient history, and epidemiological information. The expected result is Negative.  Fact Sheet for Patients:  EntrepreneurPulse.com.au  Fact Sheet for Healthcare Providers:   IncredibleEmployment.be  This test is no t yet approved or cleared by the [place] FDA and  has been authorized for detection and/or diagnosis of SARS-CoV-2 by FDA under an Emergency Use Authorization (EUA). This EUA will remain  in effect (meaning this test can be used) for the duration of the COVID-19 declaration under Section 564(b)(1) of the Act, 21 U.S.C.section 360bbb-3(b)(1), unless the authorization is terminated  or revoked sooner.       Influenza A by PCR NEGATIVE NEGATIVE Final   Influenza B by PCR NEGATIVE NEGATIVE Final    Comment: (NOTE) The Xpert Xpress SARS-CoV-2/FLU/RSV plus assay is intended as an aid in the diagnosis of influenza from Nasopharyngeal swab specimens and should not be used as a sole basis for treatment. Nasal washings and aspirates are unacceptable for Xpert Xpress SARS-CoV-2/FLU/RSV testing.  Fact Sheet for Patients: EntrepreneurPulse.com.au  Fact Sheet for Healthcare Providers: IncredibleEmployment.be  This test is not yet approved or cleared by the [place] FDA and has been authorized for detection and/or diagnosis of SARS-CoV-2 by FDA under an Emergency Use Authorization (EUA). This EUA will remain in effect (meaning this test can be used) for the duration of the COVID-19 declaration under Section 564(b)(1) of the Act, 21 U.S.C. section 360bbb-3(b)(1), unless the authorization is terminated or revoked.  Performed at [facility], [place] [address] [place], [place] [address]      Labs:  CBC: Recent Labs  Lab 04/18/20 [phone] 04/19/20 0250 04/20/20 0314  WBC 3.5* 3.7* 3.8*  NEUTROABS 1.5* 1.2* 0.9*  HGB 10.4* 10.6* 10.7*  HCT 31.7* 32.3* 32.2*  MCV 95.8 95.8 96.4  PLT 219 254 279   BMP &GFR Recent Labs  Lab 04/18/20 0743 04/19/20 0250 04/20/20 0314  NA 137 137 139  K 4.4 4.3 4.1  CL 104 104 108  CO2 26 24 24   GLUCOSE 107* 90 118*  BUN 20  17 13   CREATININE 0.84 1.03 0.98  CALCIUM 8.8* 8.8* 8.7*  MG  --  2.2 2.4  PHOS  --  4.2 3.5   Estimated Creatinine Clearance: 76.9 mL/min (by C-G formula based on SCr of 0.98 mg/dL). Liver & Pancreas: Recent Labs  Lab 04/18/20 0743 04/19/20 0250 04/20/20 0314  AST 14* 15  --   ALT 16 13  --   ALKPHOS 47 43  --   BILITOT 0.6 0.7  --   PROT 6.8 6.7  --   ALBUMIN 3.4* 3.2* 3.2*   No results for input(s): LIPASE, AMYLASE in the last 168 hours. No results for input(s): AMMONIA in the last 168 hours. Diabetic: No results for input(s): HGBA1C in the last 72 hours. Recent Labs  Lab 04/19/20 0729 04/20/20 0739  GLUCAP 102* 95   Cardiac Enzymes: No results for input(s): CKTOTAL, CKMB, CKMBINDEX, TROPONINI in the last 168 hours. No results for input(s): PROBNP in the last 8760 hours. Coagulation Profile: No results for input(s): INR, PROTIME in the last 168 hours. Thyroid Function Tests: No results for input(s): TSH,  T4TOTAL, FREET4, T3FREE, THYROIDAB in the last 72 hours. Lipid Profile: No results for input(s): CHOL, HDL, LDLCALC, TRIG, CHOLHDL, LDLDIRECT in the last 72 hours. Anemia Panel: Recent Labs    04/19/20 0250  VITAMINB12 560  FOLATE 17.5  FERRITIN 411*  TIBC 224*  IRON 27*   Urine analysis:    Component Value Date/Time   COLORURINE YELLOW 07/02/2019 1011   APPEARANCEUR CLEAR 07/02/2019 1011   LABSPEC 1.018 07/02/2019 1011   PHURINE 5.0 07/02/2019 1011   GLUCOSEU NEGATIVE 07/02/2019 [place] 07/02/2019 [place] 07/02/2019 1011   KETONESUR NEGATIVE 07/02/2019 1011   PROTEINUR NEGATIVE 07/02/2019 1011   NITRITE NEGATIVE 07/02/2019 1011   LEUKOCYTESUR SMALL (A) 07/02/2019 1011   Sepsis Labs: Invalid input(s): PROCALCITONIN, LACTICIDVEN   Time coordinating discharge: 35 minutes  SIGNED:  Mercy Riding, MD  [facility] 04/20/2020, 2:00 PM  If 7PM-7AM, please contact night-coverage [email or web address]

## 2020-04-20 NOTE — Progress Notes (Signed)
Subjective/Chief Complaint: Continues to improve.  He is speed walking in the halls this morning.  Reports the pain continues to get better, he does have a small amount of ongoing drainage  Objective: Vital signs in last 24 hours: Temp:  [97.8 F (36.6 C)-98.8 F (37.1 C)] 97.8 F (36.6 C) (04/01 0420) Pulse Rate:  [61-85] 61 (04/01 0420) Resp:  [16-18] 18 (04/01 0420) BP: (112-113)/(69-72) 112/69 (04/01 0420) SpO2:  [100 %] 100 % (04/01 0420) Last BM Date: 04/18/20  Intake/Output from previous day: No intake/output data recorded. Intake/Output this shift: No intake/output data recorded.  General appearance: alert and cooperative Resp: unlabored Cardio: regular rate and rhythm GI: soft, non-tender; bowel sounds normal; no masses,  no organomegaly Extremities: extremities normal, atraumatic, no cyanosis or edema Skin: Skin color, texture, turgor normal. No rashes or lesions Ongoing induration and tenderness to bilateral perianal region, L>R, decreased from yesterday. No fluctuance. Scant purulent drainage  Lab Results:  Recent Labs    04/19/20 0250 04/20/20 0314  WBC 3.7* 3.8*  HGB 10.6* 10.7*  HCT 32.3* 32.2*  PLT 254 279   BMET Recent Labs    04/19/20 0250 04/20/20 0314  NA 137 139  K 4.3 4.1  CL 104 108  CO2 24 24  GLUCOSE 90 118*  BUN 17 13  CREATININE 1.03 0.98  CALCIUM 8.8* 8.7*   PT/INR No results for input(s): LABPROT, INR in the last 72 hours. ABG No results for input(s): PHART, HCO3 in the last 72 hours.  Invalid input(s): PCO2, PO2  Studies/Results: CT PELVIS W CONTRAST  Result Date: 04/18/2020 CLINICAL DATA:  Perirectal abscess.  Lymphoma, recent chemotherapy. EXAM: CT PELVIS WITH CONTRAST TECHNIQUE: Multidetector CT imaging of the pelvis was performed using the standard protocol following the bolus administration of intravenous contrast. CONTRAST:  175mL OMNIPAQUE IOHEXOL 300 MG/ML  SOLN COMPARISON:  Multiple exams, including PET-CT from  02/24/2020 and CT pelvis 10/21/2019 FINDINGS: Urinary Tract:  Unremarkable Bowel: Ill definition of perianal soft tissues, probably inflammatory. Potential fluid collections bilaterally in the ischiorectal fossa, measuring about 2.6 by 1.0 by 2.0 cm (volume = 2.7 cm^3) on the left and about 1.6 by 0.9 by 1.5 cm (volume = 1 cm^3) on the right on image 40 of series 2. Abnormal bandlike stranding extends from the external sphincter region at the 3 o'clock position into the subcutaneous tissues of the left medial buttock on images 13 through 16 of series 10, and appears more prominent than on 10/21/2019. Although I not see definite gas density tracking in this region, I cannot exclude a perianal fistula. Vascular/Lymphatic: Aortoiliac atherosclerotic vascular disease. Substantially reduced inguinal adenopathy compared to 10/21/2019 and 02/24/2020, a right inguinal lymph node measures 1.0 cm in short axis on image 31 of series 2, previously 2.0 cm on 02/24/2020. Reproductive:  Unremarkable Other:  No supplemental non-categorized findings. Musculoskeletal: Prominent arthropathy of both hips with prominent spurring and moderate to prominent loss of articular cartilage. Extensive degenerative subcortical cyst formation along the femoral heads and acetabula. IMPRESSION: 1. Small fluid density collections along the ischiorectal fossa bilaterally suspicious for small abscesses. Ill definition of tissue planes between the anus and external sphincter probably from inflammation. Increased confluence of a band of density extending from about the 3 o'clock position of the left anal region into the subcutaneous tissues along the medial left buttock, this could be an inflammatory band but perianal fistula is not excluded. Clinically warranted, perianal fistula protocol MRI could be utilized for further characterization of the  region. 2.  Aortic Atherosclerosis (ICD10-I70.0). 3. Substantially reduced pelvic adenopathy compared to  02/24/2020 PET-CT 4. Prominent arthropathy of both hips. Electronically Signed   By: Van Clines M.D.   On: 04/18/2020 10:27   MR PELVIS W WO CONTRAST  Result Date: 04/18/2020 CLINICAL DATA:  Perianal abscess, with increased rectal pain. Currently undergoing chemotherapy for T-cell lymphoma EXAM: MRI PELVIS WITHOUT AND WITH CONTRAST TECHNIQUE: Multiplanar multisequence MR imaging of the pelvis was performed both before and after administration of intravenous contrast. CONTRAST:  34mL GADAVIST GADOBUTROL 1 MMOL/ML IV SOLN COMPARISON:  PET-CT on 02/24/2020 FINDINGS: Lower Urinary Tract: Unremarkable urinary bladder. Bowel: A complex transsphincteric fistula is seen which arises from the left posterior wall of the upper anal canal, near the 4 o'clock position (image 22/6). This becomes complex with branching tracts which extend posteriorly and laterally into the bilateral ischioanal fossae. These also show mild bilateral supralevator extension. Associated abscesses are seen in the left lower ischiorectal fossa measuring 3.5 x 1.7 cm, and in the right upper ischiorectal fossa measuring 2.2 x 1.5 cm. A 2nd simple intersphincteric fistula is seen arising from the posterior wall of the inferior anal canal at the 5 o'clock position (image 26/6). This fistula extends posteriorly and inferiorly into the gluteal crease. No associated abscess. Vascular/Lymphatic: Bilateral inguinal lymphadenopathy is incompletely visualized on this exam, but appears decreased compared to recent PET-CT. Reproductive: Unremarkable appearance of prostate and seminal vesicles. Other: None. Musculoskeletal: No significant abnormality identified. 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 (Grade 5) 2nd simple intersphincteric fistula, which extends posteriorly and inferiorly into the gluteal crease. No associated abscess. (Grade 1) Bilateral inguinal  lymphadenopathy is incompletely visualized, but appears decreased compared to recent PET-CT. Electronically Signed   By: Marlaine Hind M.D.   On: 04/18/2020 14:38    Anti-infectives: Anti-infectives (From admission, onward)   Start     Dose/Rate Route Frequency Ordered Stop   04/20/20 0000  amoxicillin-clavulanate (AUGMENTIN) 875-125 MG tablet        1 tablet Oral 2 times daily 04/20/20 0751 04/30/20 2359   04/18/20 2200  piperacillin-tazobactam (ZOSYN) IVPB 3.375 g        3.375 g 12.5 mL/hr over 240 Minutes Intravenous Every 8 hours 04/18/20 1608     04/18/20 1615  piperacillin-tazobactam (ZOSYN) IVPB 3.375 g        3.375 g 100 mL/hr over 30 Minutes Intravenous  Once 04/18/20 1606 04/18/20 1702      Assessment/Plan:  Anaplastic T Cell Lymphoma currently on chemotherapy (last cycle 04/05/20) - managed by Dr. 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   Plan -responding well to medical therapy.  Okay for discharge from surgical standpoint.  Follow-up appointment, PO antibiotics for home ordered, and instructions are on AVS  LOS: 1 day    Clovis Riley 04/20/2020

## 2020-04-23 ENCOUNTER — Other Ambulatory Visit: Payer: Self-pay

## 2020-04-23 MED FILL — Pantoprazole Sodium EC Tab 40 MG (Base Equiv): ORAL | 30 days supply | Qty: 30 | Fill #0 | Status: AC

## 2020-04-26 ENCOUNTER — Encounter: Payer: Self-pay | Admitting: Hematology and Oncology

## 2020-04-26 ENCOUNTER — Inpatient Hospital Stay: Payer: Self-pay | Attending: Hematology and Oncology

## 2020-04-26 ENCOUNTER — Inpatient Hospital Stay: Payer: Self-pay

## 2020-04-26 ENCOUNTER — Inpatient Hospital Stay (HOSPITAL_BASED_OUTPATIENT_CLINIC_OR_DEPARTMENT_OTHER): Payer: Self-pay | Admitting: Hematology and Oncology

## 2020-04-26 ENCOUNTER — Other Ambulatory Visit: Payer: Self-pay

## 2020-04-26 ENCOUNTER — Inpatient Hospital Stay: Payer: Self-pay | Admitting: Nutrition

## 2020-04-26 VITALS — BP 114/79 | HR 78 | Temp 96.4°F | Resp 17 | Ht 68.0 in | Wt 143.5 lb

## 2020-04-26 DIAGNOSIS — Z95828 Presence of other vascular implants and grafts: Secondary | ICD-10-CM

## 2020-04-26 DIAGNOSIS — C8460 Anaplastic large cell lymphoma, ALK-positive, unspecified site: Secondary | ICD-10-CM

## 2020-04-26 DIAGNOSIS — C8442 Peripheral T-cell lymphoma, not classified, intrathoracic lymph nodes: Secondary | ICD-10-CM

## 2020-04-26 DIAGNOSIS — Z5112 Encounter for antineoplastic immunotherapy: Secondary | ICD-10-CM | POA: Insufficient documentation

## 2020-04-26 DIAGNOSIS — C8440 Peripheral T-cell lymphoma, not classified, unspecified site: Secondary | ICD-10-CM | POA: Insufficient documentation

## 2020-04-26 LAB — CMP (CANCER CENTER ONLY)
ALT: 22 U/L (ref 0–44)
AST: 21 U/L (ref 15–41)
Albumin: 3.6 g/dL (ref 3.5–5.0)
Alkaline Phosphatase: 54 U/L (ref 38–126)
Anion gap: 9 (ref 5–15)
BUN: 20 mg/dL (ref 6–20)
CO2: 25 mmol/L (ref 22–32)
Calcium: 9.1 mg/dL (ref 8.9–10.3)
Chloride: 105 mmol/L (ref 98–111)
Creatinine: 0.89 mg/dL (ref 0.61–1.24)
GFR, Estimated: >60 mL/min (ref >60)
Glucose, Bld: 117 mg/dL — ABNORMAL HIGH (ref 70–99)
Potassium: 4.5 mmol/L (ref 3.5–5.1)
Sodium: 139 mmol/L (ref 135–145)
Total Bilirubin: <0.2 mg/dL — ABNORMAL LOW (ref 0.3–1.2)
Total Protein: 7.4 g/dL (ref 6.5–8.1)

## 2020-04-26 LAB — CBC WITH DIFFERENTIAL (CANCER CENTER ONLY)
Abs Immature Granulocytes: 0.1 10*3/uL — ABNORMAL HIGH (ref 0.00–0.07)
Basophils Absolute: 0.1 10*3/uL (ref 0.0–0.1)
Basophils Relative: 1 %
Eosinophils Absolute: 0 10*3/uL (ref 0.0–0.5)
Eosinophils Relative: 0 %
HCT: 35 % — ABNORMAL LOW (ref 39.0–52.0)
Hemoglobin: 11.6 g/dL — ABNORMAL LOW (ref 13.0–17.0)
Immature Granulocytes: 1 %
Lymphocytes Relative: 16 %
Lymphs Abs: 1.1 10*3/uL (ref 0.7–4.0)
MCH: 31.4 pg (ref 26.0–34.0)
MCHC: 33.1 g/dL (ref 30.0–36.0)
MCV: 94.9 fL (ref 80.0–100.0)
Monocytes Absolute: 0.3 10*3/uL (ref 0.1–1.0)
Monocytes Relative: 4 %
Neutro Abs: 5.5 10*3/uL (ref 1.7–7.7)
Neutrophils Relative %: 78 %
Platelet Count: 381 10*3/uL (ref 150–400)
RBC: 3.69 MIL/uL — ABNORMAL LOW (ref 4.22–5.81)
RDW: 17 % — ABNORMAL HIGH (ref 11.5–15.5)
WBC Count: 7.1 10*3/uL (ref 4.0–10.5)
nRBC: 0.3 % — ABNORMAL HIGH (ref 0.0–0.2)

## 2020-04-26 LAB — URIC ACID: Uric Acid, Serum: 3.5 mg/dL — ABNORMAL LOW (ref 3.7–8.6)

## 2020-04-26 LAB — LACTATE DEHYDROGENASE: LDH: 231 U/L — ABNORMAL HIGH (ref 98–192)

## 2020-04-26 MED ORDER — PALONOSETRON HCL INJECTION 0.25 MG/5ML
0.2500 mg | Freq: Once | INTRAVENOUS | Status: AC
  Administered 2020-04-26: 0.25 mg via INTRAVENOUS

## 2020-04-26 MED ORDER — ACETAMINOPHEN 325 MG PO TABS
ORAL_TABLET | ORAL | Status: AC
  Filled 2020-04-26: qty 2

## 2020-04-26 MED ORDER — DIPHENHYDRAMINE HCL 50 MG/ML IJ SOLN
50.0000 mg | Freq: Once | INTRAMUSCULAR | Status: AC
  Administered 2020-04-26: 50 mg via INTRAVENOUS

## 2020-04-26 MED ORDER — SODIUM CHLORIDE 0.9 % IV SOLN
150.0000 mg | Freq: Once | INTRAVENOUS | Status: AC
  Administered 2020-04-26: 150 mg via INTRAVENOUS
  Filled 2020-04-26: qty 150

## 2020-04-26 MED ORDER — SODIUM CHLORIDE 0.9% FLUSH
10.0000 mL | INTRAVENOUS | Status: DC | PRN
  Administered 2020-04-26: 10 mL
  Filled 2020-04-26: qty 10

## 2020-04-26 MED ORDER — SODIUM CHLORIDE 0.9 % IV SOLN
1.8000 mg/kg | Freq: Once | INTRAVENOUS | Status: AC
  Administered 2020-04-26: 115 mg via INTRAVENOUS
  Filled 2020-04-26: qty 23

## 2020-04-26 MED ORDER — DOXORUBICIN HCL CHEMO IV INJECTION 2 MG/ML
50.0000 mg/m2 | Freq: Once | INTRAVENOUS | Status: AC
  Administered 2020-04-26: 88 mg via INTRAVENOUS
  Filled 2020-04-26: qty 44

## 2020-04-26 MED ORDER — SODIUM CHLORIDE 0.9 % IV SOLN
10.0000 mg | Freq: Once | INTRAVENOUS | Status: AC
  Administered 2020-04-26: 10 mg via INTRAVENOUS
  Filled 2020-04-26: qty 10

## 2020-04-26 MED ORDER — HEPARIN SOD (PORK) LOCK FLUSH 100 UNIT/ML IV SOLN
500.0000 [IU] | Freq: Once | INTRAVENOUS | Status: AC | PRN
  Administered 2020-04-26: 500 [IU]
  Filled 2020-04-26: qty 5

## 2020-04-26 MED ORDER — SODIUM CHLORIDE 0.9 % IV SOLN
Freq: Once | INTRAVENOUS | Status: AC
  Filled 2020-04-26: qty 250

## 2020-04-26 MED ORDER — DIPHENHYDRAMINE HCL 50 MG/ML IJ SOLN
INTRAMUSCULAR | Status: AC
  Filled 2020-04-26: qty 1

## 2020-04-26 MED ORDER — SODIUM CHLORIDE 0.9 % IV SOLN
750.0000 mg/m2 | Freq: Once | INTRAVENOUS | Status: AC
  Administered 2020-04-26: 1320 mg via INTRAVENOUS
  Filled 2020-04-26: qty 66

## 2020-04-26 MED ORDER — PALONOSETRON HCL INJECTION 0.25 MG/5ML
INTRAVENOUS | Status: AC
  Filled 2020-04-26: qty 5

## 2020-04-26 MED ORDER — ACETAMINOPHEN 325 MG PO TABS
650.0000 mg | ORAL_TABLET | Freq: Once | ORAL | Status: AC
  Administered 2020-04-26: 650 mg via ORAL

## 2020-04-26 NOTE — Patient Instructions (Signed)
[facility] Discharge Instructions for Patients Receiving Chemotherapy  Today you received the following chemotherapy agents: doxorubicin/cyclophosphamide/brentuximab.  To help prevent nausea and vomiting after your treatment, we encourage you to take your nausea medication as directed.   If you develop nausea and vomiting that is not controlled by your nausea medication, call the clinic.   BELOW ARE SYMPTOMS THAT SHOULD BE REPORTED IMMEDIATELY:  *FEVER GREATER THAN 100.5 F  *CHILLS WITH OR WITHOUT FEVER  NAUSEA AND VOMITING THAT IS NOT CONTROLLED WITH YOUR NAUSEA MEDICATION  *UNUSUAL SHORTNESS OF BREATH  *UNUSUAL BRUISING OR BLEEDING  TENDERNESS IN MOUTH AND THROAT WITH OR WITHOUT PRESENCE OF ULCERS  *URINARY PROBLEMS  *BOWEL PROBLEMS  UNUSUAL RASH Items with * indicate a potential emergency and should be followed up as soon as possible.  Feel free to call the clinic should you have any questions or concerns. The clinic phone number is [phone].  Please show the [place] at check-in to the Emergency Department and triage nurse.

## 2020-04-26 NOTE — Progress Notes (Signed)
Nutrition follow-up completed with patient during infusion for lymphoma.  Weight is stable and documented as 139 pounds on March 31.  Patient denies nutrition impact symptoms.  Reports eating well.  He is requesting an additional case of Ensure Plus.  He has no questions or concerns.  Nutrition diagnosis: Increased nutrient needs has resolved.  Encourage patient to contact RD with any questions or concerns.  Provided 1 complementary case of Ensure Plus.  **Disclaimer: This note was dictated with voice recognition software. Similar sounding words can inadvertently be transcribed and this note may contain transcription errors which may not have been corrected upon publication of note.**

## 2020-04-26 NOTE — Progress Notes (Signed)
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 Previously seen in June 2021 and given course of doxycycline. Infectious workup including RPR, HIV, GC/chlamydia were negative.  2) 09/23/2019: presented to PCP with concern for bilaterally enlarged lymph nodes of the groin. Noted to have scant drainage 3) 10/12/2019: establish care with Dr. 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  5) 02/23/2020: Cycle 1 Day 1 of BV-CHP 6) 03/15/2020: Cycle 2 Day 1 of BV-CHP 7) 04/05/2020: Cycle 3 Day 1 of BV-CHP 8) 04/26/2020: Cycle 4 Day 1 of BV-CHP  Interval History:  Tanner Gallagher 55 y.o. male with medical history significant for Anaplastic T Cell Lymphoma who presents for a follow up visit. The patient's last visit was on 04/05/2020. In the interim since the last visit the patient he has completed Cycle 3.  On exam today Tanner Gallagher notes he tolerated his third cycle of chemotherapy quite well.  He was unfortunately admitted to the hospital due to worsening of his perirectal abscess.  He was admitted from 04/18/2020 until 04/20/2020.  He was evaluated by surgery who recommended medical management only.  He was treated with antibiotic therapy with near resolution of his symptoms.  He reports he is continuing to take antibiotic therapy today, and that he feels much better.  He notes that the lesion is not resolving.  He is not had any nausea, vomiting, or diarrhea with his last cycle chemotherapy and his appetite is been "great".  He notes that he has not noticed any lumps or bumps elsewhere in the lymphadenopathy in his groin area is improving significantly.   Otherwise he denies any fevers, chills, sweats,  nausea, vomiting or diarrhea. A full 10 point ROS is listed below.  MEDICAL HISTORY:  Past Medical History:  Diagnosis Date  . Cancer (HCC)    lymph nodes  . Chronic lower back pain 15 years  . Medical history non-contributory   . Tooth pain    no abscess or drainage per pt 02-01-2020    SURGICAL HISTORY: Past Surgical History:  Procedure Laterality Date  . INCISION AND DRAINAGE PERIRECTAL ABSCESS  06/16/2017  . INCISION AND DRAINAGE PERIRECTAL ABSCESS N/A 06/16/2017   Procedure: IRRIGATION AND DEBRIDEMENT PERIRECTAL ABSCESS;  Surgeon: Coralie Keens, MD;  Location: [facility];  Service: General;  Laterality: N/A;  . INGUINAL HERNIA REPAIR Right 2000s  . IR IMAGING GUIDED PORT INSERTION  02/15/2020  . LYMPH NODE BIOPSY Right 02/02/2020   Procedure: EXCISIONAL INGUINAL LYMPH NODE BIOPSY;  Surgeon: Leighton Ruff, MD;  Location: [facility];  Service: General;  Laterality: Right;  . PROCTOSCOPY  06/16/2017   Procedure: RIGID PROCTOSCOPY;  Surgeon: Coralie Keens, MD;  Location: [facility] OR;  Service: General;;    SOCIAL HISTORY: Social History   Socioeconomic History  . Marital status: Married    Spouse name: Not on file  . Number of children: Not on file  . Years of education: Not on file  . Highest education level: Not on file  Occupational History  . Not on file  Tobacco Use  . Smoking status: Former Smoker    Packs/day: 0.12    Years: 37.00    Pack years: 4.44    Types:  Cigarettes    Quit date: 05/20/2017    Years since quitting: 2.9  . Smokeless tobacco: Never Used  Vaping Use  . Vaping Use: Never used  Substance and Sexual Activity  . Alcohol use: Not Currently    Comment: rarew  . Drug use: Yes    Types: Marijuana    Comment: 02-01-2020 "once a day"  . Sexual activity: Yes  Other Topics Concern  . Not on file  Social History Narrative  . Not on file   Social Determinants of Health   Financial Resource Strain: Not on file  Food Insecurity: Not on  file  Transportation Needs: Not on file  Physical Activity: Not on file  Stress: Not on file  Social Connections: Not on file  Intimate Partner Violence: Not on file    FAMILY HISTORY: Family History  Problem Relation Age of Onset  . Diabetes Neg Hx     ALLERGIES:  has No Known Allergies.  MEDICATIONS:  Current Outpatient Medications  Medication Sig Dispense Refill  . acetaminophen (TYLENOL) 500 MG tablet Take 1,000 mg by mouth every 6 (six) hours as needed for mild pain, fever or headache.    . allopurinol (ZYLOPRIM) 300 MG tablet TAKE 1 TABLET (300 MG TOTAL) BY MOUTH DAILY. 30 tablet 3  . amoxicillin-clavulanate (AUGMENTIN) 875-125 MG tablet TAKE 1 TABLET BY MOUTH 2 (TWO) TIMES DAILY FOR 10 DAYS. 20 tablet 0  . lidocaine-prilocaine (EMLA) cream APPLY 1 APPLICATION TOPICALLY AS NEEDED. (Patient taking differently: Apply 1 application topically as needed (port access/hemmoroids).) 30 g 1  . ondansetron (ZOFRAN) 8 MG tablet TAKE 1 TABLET (8 MG TOTAL) BY MOUTH EVERY 8 (EIGHT) HOURS AS NEEDED FOR NAUSEA OR VOMITING. 20 tablet 2  . pantoprazole (PROTONIX) 40 MG tablet TAKE 1 TABLET (40 MG TOTAL) BY MOUTH DAILY. 30 tablet 3  . predniSONE (DELTASONE) 20 MG tablet TAKE 5 TABLETS (100 MG TOTAL) BY MOUTH DAILY WITH BREAKFAST. TAKE FOR 5 DAYS, STARTING ON DAY 1 OF YOUR CHEMOTHERAPY 100 tablet 3  . predniSONE (DELTASONE) 20 MG tablet TAKE 5 TABLETS (100 MG TOTAL) BY MOUTH DAILY WITH BREAKFAST. TAKE FOR 5 DAYS, STARTING ON DAY 1 OF YOUR CHEMOTHERAPY 100 tablet 3  . prochlorperazine (COMPAZINE) 10 MG tablet TAKE 1 TABLET (10 MG TOTAL) BY MOUTH EVERY 6 (SIX) HOURS AS NEEDED FOR NAUSEA OR VOMITING. 30 tablet 3  . senna-docusate (SENOKOT-S) 8.6-50 MG tablet Take 1 tablet by mouth 2 (two) times daily between meals as needed for mild constipation. 60 tablet 0   No current facility-administered medications for this visit.   Facility-Administered Medications Ordered in Other Visits  Medication Dose  Route Frequency Provider Last Rate Last Admin  . brentuximab vedotin (ADCETRIS) 115 mg in sodium chloride 0.9 % 100 mL chemo infusion  1.8 mg/kg (Treatment Plan Recorded) Intravenous Once Orson Slick, MD      . cyclophosphamide (CYTOXAN) 1,320 mg in sodium chloride 0.9 % 250 mL chemo infusion  750 mg/m2 (Treatment Plan Recorded) Intravenous Once Ledell Peoples IV, MD      . DOXOrubicin (ADRIAMYCIN) chemo injection 88 mg  50 mg/m2 (Treatment Plan Recorded) Intravenous Once Ledell Peoples IV, MD   88 mg at 04/26/20 1425  . heparin lock flush 100 unit/mL  500 Units Intracatheter Once PRN Narda Rutherford T IV, MD      . sodium chloride flush (NS) 0.9 % injection 10 mL  10 mL Intracatheter PRN Orson Slick, MD  REVIEW OF SYSTEMS:   Constitutional: ( - ) fevers, ( - )  chills , ( - ) night sweats Eyes: ( - ) blurriness of vision, ( - ) double vision, ( - ) watery eyes Ears, nose, mouth, throat, and face: ( - ) mucositis, ( - ) sore throat Respiratory: ( - ) cough, ( - ) dyspnea, ( - ) wheezes Cardiovascular: ( - ) palpitation, ( - ) chest discomfort, ( - ) lower extremity swelling Gastrointestinal:  ( - ) nausea, ( - ) heartburn, ( - ) change in bowel habits Skin: ( - ) abnormal skin rashes Lymphatics: ( - ) new lymphadenopathy, ( - ) easy bruising Neurological: ( - ) numbness, ( - ) tingling, ( - ) new weaknesses Behavioral/Psych: ( - ) mood change, ( - ) new changes  All other systems were reviewed with the patient and are negative.  PHYSICAL EXAMINATION: ECOG PERFORMANCE STATUS: 0 - Asymptomatic  Vitals:   04/26/20 1221  BP: 114/79  Pulse: 78  Resp: 17  Temp: (!) 96.4 F (35.8 C)  SpO2: 100%   Filed Weights   04/26/20 1221  Weight: 143 lb 8 oz (65.1 kg)    GENERAL: well appearing middle aged [place] American male. alert, no distress and comfortable SKIN: skin color, texture, turgor are normal, no rashes or significant lesions EYES: conjunctiva are pink and  non-injected, sclera clear LYMPH:  Deferred today LUNGS: clear to auscultation and percussion with normal breathing effort HEART: regular rate & rhythm and no murmurs and no lower extremity edema Musculoskeletal: no cyanosis of digits and no clubbing  PSYCH: alert & oriented x 3, fluent speech NEURO: no focal motor/sensory deficits  LABORATORY DATA:  I have reviewed the data as listed CBC Latest Ref Rng & Units 04/26/2020 04/20/2020 04/19/2020  WBC 4.0 - 10.5 K/uL 7.1 3.8(L) 3.7(L)  Hemoglobin 13.0 - 17.0 g/dL 11.6(L) 10.7(L) 10.6(L)  Hematocrit 39.0 - 52.0 % 35.0(L) 32.2(L) 32.3(L)  Platelets 150 - 400 K/uL 381 279 254    CMP Latest Ref Rng & Units 04/26/2020 04/20/2020 04/19/2020  Glucose 70 - 99 mg/dL 117(H) 118(H) 90  BUN 6 - 20 mg/dL $Remove'20 13 17  'vPRxNhB$ Creatinine 0.61 - 1.24 mg/dL 0.89 0.98 1.03  Sodium 135 - 145 mmol/L 139 139 137  Potassium 3.5 - 5.1 mmol/L 4.5 4.1 4.3  Chloride 98 - 111 mmol/L 105 108 104  CO2 22 - 32 mmol/L $RemoveB'25 24 24  'vFHrGzgB$ Calcium 8.9 - 10.3 mg/dL 9.1 8.7(L) 8.8(L)  Total Protein 6.5 - 8.1 g/dL 7.4 - 6.7  Total Bilirubin 0.3 - 1.2 mg/dL <0.2(L) - 0.7  Alkaline Phos 38 - 126 U/L 54 - 43  AST 15 - 41 U/L 21 - 15  ALT 0 - 44 U/L 22 - 13    RADIOGRAPHIC STUDIES: CT PELVIS W CONTRAST  Result Date: 04/18/2020 CLINICAL DATA:  Perirectal abscess.  Lymphoma, recent chemotherapy. EXAM: CT PELVIS WITH CONTRAST TECHNIQUE: Multidetector CT imaging of the pelvis was performed using the standard protocol following the bolus administration of intravenous contrast. CONTRAST:  161mL OMNIPAQUE IOHEXOL 300 MG/ML  SOLN COMPARISON:  Multiple exams, including PET-CT from 02/24/2020 and CT pelvis 10/21/2019 FINDINGS: Urinary Tract:  Unremarkable Bowel: Ill definition of perianal soft tissues, probably inflammatory. Potential fluid collections bilaterally in the ischiorectal fossa, measuring about 2.6 by 1.0 by 2.0 cm (volume = 2.7 cm^3) on the left and about 1.6 by 0.9 by 1.5 cm (volume = 1 cm^3) on the  right on  image 40 of series 2. Abnormal bandlike stranding extends from the external sphincter region at the 3 o'clock position into the subcutaneous tissues of the left medial buttock on images 13 through 16 of series 10, and appears more prominent than on 10/21/2019. Although I not see definite gas density tracking in this region, I cannot exclude a perianal fistula. Vascular/Lymphatic: Aortoiliac atherosclerotic vascular disease. Substantially reduced inguinal adenopathy compared to 10/21/2019 and 02/24/2020, a right inguinal lymph node measures 1.0 cm in short axis on image 31 of series 2, previously 2.0 cm on 02/24/2020. Reproductive:  Unremarkable Other:  No supplemental non-categorized findings. Musculoskeletal: Prominent arthropathy of both hips with prominent spurring and moderate to prominent loss of articular cartilage. Extensive degenerative subcortical cyst formation along the femoral heads and acetabula. IMPRESSION: 1. Small fluid density collections along the ischiorectal fossa bilaterally suspicious for small abscesses. Ill definition of tissue planes between the anus and external sphincter probably from inflammation. Increased confluence of a band of density extending from about the 3 o'clock position of the left anal region into the subcutaneous tissues along the medial left buttock, this could be an inflammatory band but perianal fistula is not excluded. Clinically warranted, perianal fistula protocol MRI could be utilized for further characterization of the region. 2.  Aortic Atherosclerosis (ICD10-I70.0). 3. Substantially reduced pelvic adenopathy compared to 02/24/2020 PET-CT 4. Prominent arthropathy of both hips. Electronically Signed   By: Gaylyn Rong M.D.   On: 04/18/2020 10:27   MR PELVIS W WO CONTRAST  Result Date: 04/18/2020 CLINICAL DATA:  Perianal abscess, with increased rectal pain. Currently undergoing chemotherapy for T-cell lymphoma EXAM: MRI PELVIS WITHOUT AND WITH  CONTRAST TECHNIQUE: Multiplanar multisequence MR imaging of the pelvis was performed both before and after administration of intravenous contrast. CONTRAST:  110mL GADAVIST GADOBUTROL 1 MMOL/ML IV SOLN COMPARISON:  PET-CT on 02/24/2020 FINDINGS: Lower Urinary Tract: Unremarkable urinary bladder. Bowel: A complex transsphincteric fistula is seen which arises from the left posterior wall of the upper anal canal, near the 4 o'clock position (image 22/6). This becomes complex with branching tracts which extend posteriorly and laterally into the bilateral ischioanal fossae. These also show mild bilateral supralevator extension. Associated abscesses are seen in the left lower ischiorectal fossa measuring 3.5 x 1.7 cm, and in the right upper ischiorectal fossa measuring 2.2 x 1.5 cm. A 2nd simple intersphincteric fistula is seen arising from the posterior wall of the inferior anal canal at the 5 o'clock position (image 26/6). This fistula extends posteriorly and inferiorly into the gluteal crease. No associated abscess. Vascular/Lymphatic: Bilateral inguinal lymphadenopathy is incompletely visualized on this exam, but appears decreased compared to recent PET-CT. Reproductive: Unremarkable appearance of prostate and seminal vesicles. Other: None. Musculoskeletal: No significant abnormality identified. 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 (Grade 5) 2nd simple intersphincteric fistula, which extends posteriorly and inferiorly into the gluteal crease. No associated abscess. (Grade 1) Bilateral inguinal lymphadenopathy is incompletely visualized, but appears decreased compared to recent PET-CT. Electronically Signed   By: Danae Orleans M.D.   On: 04/18/2020 14:38    ASSESSMENT & PLAN Tanner Gallagher 55 y.o. male with no significant medical history presents for evaluation of Anaplastic T Cell Lymphoma.  Review the labs, review the  records, discussion with the patient the findings are most consistent with Anaplastic T Cell Lymphoma.     PET CT scan performed on 02/24/2020 confirmed Stage III disease with involvement of the axillary and inguinal lymph nodes. He  has also had a port placed and echocardiogram performed.   Previously Tanner Gallagher has had the opportunity to ask any questions or concerns that he has had about his disease or chemotherapy moving forward.  He notes that he is aware of the risks and benefits of treatment and is willing and able to proceed with treatment today.  The treatment of choice for this patient's cancer would be BV-CHP chemotherapy.  This regimen consists of doxorubicin 50 mg per metered squared on day 1, cyclophosphamide 750 mg per metered squared on day 1, and brentuximab 1.8 mg/kg on day 1 of a 21-day cycle.  The patient also received prednisone 100mg  PO on days 1-5.  We will proceed for 6 cycles.  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  --port had been placed, TTE shows adequate heart function for anthracycline therapy.  --PET CT scan shows involvement of the inguinal/axillary lymph nodes. Findings consistent with Stage III disease.  --plan for BV-CHP chemotherapy as noted above. Cycle 1 Day 1 of treatment started on 02/23/2020. Today is Cycle 4 Day 1.  --RTC on 05/17/2020 for Cycle 5 Day 1 of treatment  #Supportive Care --zofran 8mg  q8H PRN and compazine 10mg  PO q6H for nausea -- allopurinol 300mg  PO daily for TLS prophylaxis -- EMLA cream for port -- no pain medication required at this time.   No orders of the defined types were placed in this encounter.   All questions were answered. The patient knows to call the clinic with any problems, questions or concerns.  A total of more than 30 minutes were spent on this encounter and over half of that time was spent on counseling and coordination of care as  outlined above.   Ledell Peoples, MD Department of Hematology/Oncology [facility] Phone: [phone] Pager: [phone] Email: [email or web address]  04/26/2020 2:25 PM   Literature Support:  Lynden Ang OA, [place] B, [place], [place], [place], [place], [place], [place] F, Domingo[place]Domenech E, Margot Chimes [address] D, [place] , [place], [place], [place] SP, [place] A, Httmann A, [place], Yuen S, Iyer S, Zinzani PL, Hua Z, [place] M, Romelle Starcher T, Trmper L; [place] Study Group. 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 Lancet. 2019 Jan 19;393(10168):229-240.   --Median progression-free survival was 482 months (95% CI 352-not evaluable) in the A+CHP group and 208 months (127-476) in the CHOP group (hazard ratio 071 [95% CI 054-093], p=[id number]).   --Front-line treatment with A+CHP is superior to CHOP for patients with CD30-positive peripheral T-cell lymphomas as shown by a significant improvement in progression-free survival and overall survival with a manageable safety profile.

## 2020-04-28 ENCOUNTER — Inpatient Hospital Stay: Payer: Self-pay

## 2020-04-28 ENCOUNTER — Other Ambulatory Visit: Payer: Self-pay

## 2020-04-28 VITALS — BP 108/67 | HR 72 | Temp 96.7°F | Resp 18

## 2020-04-28 DIAGNOSIS — C8460 Anaplastic large cell lymphoma, ALK-positive, unspecified site: Secondary | ICD-10-CM

## 2020-04-28 MED ORDER — PEGFILGRASTIM-CBQV 6 MG/0.6ML ~~LOC~~ SOSY
6.0000 mg | PREFILLED_SYRINGE | Freq: Once | SUBCUTANEOUS | Status: AC
  Administered 2020-04-28: 6 mg via SUBCUTANEOUS

## 2020-04-28 NOTE — Patient Instructions (Signed)
Pegfilgrastim injection What is this medicine? PEGFILGRASTIM (PEG fil gra stim) is a long-acting granulocyte colony-stimulating factor that stimulates the growth of neutrophils, a type of white blood cell important in the body's fight against infection. It is used to reduce the incidence of fever and infection in patients with certain types of cancer who are receiving chemotherapy that affects the bone marrow, and to increase survival after being exposed to high doses of radiation. This medicine may be used for other purposes; ask your health care provider or pharmacist if you have questions. COMMON BRAND NAME(S): Fulphila, Neulasta, Nyvepria, UDENYCA, Ziextenzo What should I tell my health care provider before I take this medicine? They need to know if you have any of these conditions:  kidney disease  latex allergy  ongoing radiation therapy  sickle cell disease  skin reactions to acrylic adhesives (On-Body Injector only)  an unusual or allergic reaction to pegfilgrastim, filgrastim, other medicines, foods, dyes, or preservatives  pregnant or trying to get pregnant  breast-feeding How should I use this medicine? This medicine is for injection under the skin. If you get this medicine at home, you will be taught how to prepare and give the pre-filled syringe or how to use the On-body Injector. Refer to the patient Instructions for Use for detailed instructions. Use exactly as directed. Tell your healthcare provider immediately if you suspect that the On-body Injector may not have performed as intended or if you suspect the use of the On-body Injector resulted in a missed or partial dose. It is important that you put your used needles and syringes in a special sharps container. Do not put them in a trash can. If you do not have a sharps container, call your pharmacist or healthcare provider to get one. Talk to your pediatrician regarding the use of this medicine in children. While this drug  may be prescribed for selected conditions, precautions do apply. Overdosage: If you think you have taken too much of this medicine contact a poison control center or emergency room at once. NOTE: This medicine is only for you. Do not share this medicine with others. What if I miss a dose? It is important not to miss your dose. Call your doctor or health care professional if you miss your dose. If you miss a dose due to an On-body Injector failure or leakage, a new dose should be administered as soon as possible using a single prefilled syringe for manual use. What may interact with this medicine? Interactions have not been studied. This list may not describe all possible interactions. Give your health care provider a list of all the medicines, herbs, non-prescription drugs, or dietary supplements you use. Also tell them if you smoke, drink alcohol, or use illegal drugs. Some items may interact with your medicine. What should I watch for while using this medicine? Your condition will be monitored carefully while you are receiving this medicine. You may need blood work done while you are taking this medicine. Talk to your health care provider about your risk of cancer. You may be more at risk for certain types of cancer if you take this medicine. If you are going to need a MRI, CT scan, or other procedure, tell your doctor that you are using this medicine (On-Body Injector only). What side effects may I notice from receiving this medicine? Side effects that you should report to your doctor or health care professional as soon as possible:  allergic reactions (skin rash, itching or hives, swelling of   the face, lips, or tongue)  back pain  dizziness  fever  pain, redness, or irritation at site where injected  pinpoint red spots on the skin  red or dark-brown urine  shortness of breath or breathing problems  stomach or side pain, or pain at the shoulder  swelling  tiredness  trouble  passing urine or change in the amount of urine  unusual bruising or bleeding Side effects that usually do not require medical attention (report to your doctor or health care professional if they continue or are bothersome):  bone pain  muscle pain This list may not describe all possible side effects. Call your doctor for medical advice about side effects. You may report side effects to FDA at 1-800-FDA-1088. Where should I keep my medicine? Keep out of the reach of children. If you are using this medicine at home, you will be instructed on how to store it. Throw away any unused medicine after the expiration date on the label. NOTE: This sheet is a summary. It may not cover all possible information. If you have questions about this medicine, talk to your doctor, pharmacist, or health care provider.  2021 Elsevier/Gold Standard (2019-01-28 13:20:51)  

## 2020-05-16 ENCOUNTER — Telehealth: Payer: Self-pay | Admitting: Hematology and Oncology

## 2020-05-16 NOTE — Telephone Encounter (Signed)
Called and left msg about change in provider for 4/28 appt due to provider family emergency  

## 2020-05-17 ENCOUNTER — Inpatient Hospital Stay: Payer: Self-pay | Admitting: Hematology and Oncology

## 2020-05-17 ENCOUNTER — Other Ambulatory Visit: Payer: Self-pay

## 2020-05-17 ENCOUNTER — Inpatient Hospital Stay: Payer: Self-pay

## 2020-05-17 ENCOUNTER — Inpatient Hospital Stay (HOSPITAL_BASED_OUTPATIENT_CLINIC_OR_DEPARTMENT_OTHER): Payer: Self-pay | Admitting: Physician Assistant

## 2020-05-17 VITALS — BP 107/67 | HR 74 | Temp 97.5°F | Resp 19 | Ht 68.0 in | Wt 147.7 lb

## 2020-05-17 DIAGNOSIS — C8460 Anaplastic large cell lymphoma, ALK-positive, unspecified site: Secondary | ICD-10-CM

## 2020-05-17 DIAGNOSIS — K611 Rectal abscess: Secondary | ICD-10-CM

## 2020-05-17 DIAGNOSIS — Z95828 Presence of other vascular implants and grafts: Secondary | ICD-10-CM

## 2020-05-17 LAB — CBC WITH DIFFERENTIAL (CANCER CENTER ONLY)
Abs Immature Granulocytes: 0.03 10*3/uL (ref 0.00–0.07)
Basophils Absolute: 0.1 10*3/uL (ref 0.0–0.1)
Basophils Relative: 2 %
Eosinophils Absolute: 0.1 10*3/uL (ref 0.0–0.5)
Eosinophils Relative: 2 %
HCT: 31.7 % — ABNORMAL LOW (ref 39.0–52.0)
Hemoglobin: 10.5 g/dL — ABNORMAL LOW (ref 13.0–17.0)
Immature Granulocytes: 1 %
Lymphocytes Relative: 21 %
Lymphs Abs: 1.2 10*3/uL (ref 0.7–4.0)
MCH: 31.3 pg (ref 26.0–34.0)
MCHC: 33.1 g/dL (ref 30.0–36.0)
MCV: 94.6 fL (ref 80.0–100.0)
Monocytes Absolute: 0.5 10*3/uL (ref 0.1–1.0)
Monocytes Relative: 8 %
Neutro Abs: 3.7 10*3/uL (ref 1.7–7.7)
Neutrophils Relative %: 66 %
Platelet Count: 449 10*3/uL — ABNORMAL HIGH (ref 150–400)
RBC: 3.35 MIL/uL — ABNORMAL LOW (ref 4.22–5.81)
RDW: 17.5 % — ABNORMAL HIGH (ref 11.5–15.5)
WBC Count: 5.6 10*3/uL (ref 4.0–10.5)
nRBC: 0 % (ref 0.0–0.2)

## 2020-05-17 LAB — CMP (CANCER CENTER ONLY)
ALT: 15 U/L (ref 0–44)
AST: 22 U/L (ref 15–41)
Albumin: 3.1 g/dL — ABNORMAL LOW (ref 3.5–5.0)
Alkaline Phosphatase: 56 U/L (ref 38–126)
Anion gap: 7 (ref 5–15)
BUN: 15 mg/dL (ref 6–20)
CO2: 30 mmol/L (ref 22–32)
Calcium: 9.2 mg/dL (ref 8.9–10.3)
Chloride: 104 mmol/L (ref 98–111)
Creatinine: 0.78 mg/dL (ref 0.61–1.24)
GFR, Estimated: >60 mL/min (ref >60)
Glucose, Bld: 107 mg/dL — ABNORMAL HIGH (ref 70–99)
Potassium: 4.2 mmol/L (ref 3.5–5.1)
Sodium: 141 mmol/L (ref 135–145)
Total Bilirubin: <0.2 mg/dL — ABNORMAL LOW (ref 0.3–1.2)
Total Protein: 6.7 g/dL (ref 6.5–8.1)

## 2020-05-17 LAB — LACTATE DEHYDROGENASE: LDH: 213 U/L — ABNORMAL HIGH (ref 98–192)

## 2020-05-17 LAB — URIC ACID: Uric Acid, Serum: 5 mg/dL (ref 3.7–8.6)

## 2020-05-17 MED ORDER — SODIUM CHLORIDE 0.9 % IV SOLN
1.8000 mg/kg | Freq: Once | INTRAVENOUS | Status: AC
  Administered 2020-05-17: 115 mg via INTRAVENOUS
  Filled 2020-05-17: qty 23

## 2020-05-17 MED ORDER — SODIUM CHLORIDE 0.9 % IV SOLN
750.0000 mg/m2 | Freq: Once | INTRAVENOUS | Status: AC
  Administered 2020-05-17: 1320 mg via INTRAVENOUS
  Filled 2020-05-17: qty 66

## 2020-05-17 MED ORDER — DIPHENHYDRAMINE HCL 50 MG/ML IJ SOLN
50.0000 mg | Freq: Once | INTRAMUSCULAR | Status: AC
  Administered 2020-05-17: 50 mg via INTRAVENOUS

## 2020-05-17 MED ORDER — ACETAMINOPHEN 325 MG PO TABS
650.0000 mg | ORAL_TABLET | Freq: Once | ORAL | Status: AC
  Administered 2020-05-17: 650 mg via ORAL

## 2020-05-17 MED ORDER — DIPHENHYDRAMINE HCL 50 MG/ML IJ SOLN
INTRAMUSCULAR | Status: AC
  Filled 2020-05-17: qty 1

## 2020-05-17 MED ORDER — DOXORUBICIN HCL CHEMO IV INJECTION 2 MG/ML
50.0000 mg/m2 | Freq: Once | INTRAVENOUS | Status: AC
  Administered 2020-05-17: 88 mg via INTRAVENOUS
  Filled 2020-05-17: qty 44

## 2020-05-17 MED ORDER — SODIUM CHLORIDE 0.9% FLUSH
10.0000 mL | Freq: Once | INTRAVENOUS | Status: AC
  Administered 2020-05-17: 10 mL
  Filled 2020-05-17: qty 10

## 2020-05-17 MED ORDER — SODIUM CHLORIDE 0.9 % IV SOLN
10.0000 mg | Freq: Once | INTRAVENOUS | Status: AC
  Administered 2020-05-17: 10 mg via INTRAVENOUS
  Filled 2020-05-17: qty 10

## 2020-05-17 MED ORDER — SODIUM CHLORIDE 0.9% FLUSH
10.0000 mL | INTRAVENOUS | Status: DC | PRN
  Administered 2020-05-17: 10 mL
  Filled 2020-05-17: qty 10

## 2020-05-17 MED ORDER — HEPARIN SOD (PORK) LOCK FLUSH 100 UNIT/ML IV SOLN
500.0000 [IU] | Freq: Once | INTRAVENOUS | Status: AC | PRN
  Administered 2020-05-17: 500 [IU]
  Filled 2020-05-17: qty 5

## 2020-05-17 MED ORDER — PALONOSETRON HCL INJECTION 0.25 MG/5ML
0.2500 mg | Freq: Once | INTRAVENOUS | Status: AC
  Administered 2020-05-17: 0.25 mg via INTRAVENOUS

## 2020-05-17 MED ORDER — SODIUM CHLORIDE 0.9 % IV SOLN
Freq: Once | INTRAVENOUS | Status: AC
  Filled 2020-05-17: qty 250

## 2020-05-17 MED ORDER — SODIUM CHLORIDE 0.9 % IV SOLN
150.0000 mg | Freq: Once | INTRAVENOUS | Status: AC
  Administered 2020-05-17: 150 mg via INTRAVENOUS
  Filled 2020-05-17: qty 150

## 2020-05-17 MED ORDER — ACETAMINOPHEN 325 MG PO TABS
ORAL_TABLET | ORAL | Status: AC
  Filled 2020-05-17: qty 2

## 2020-05-17 MED ORDER — AMOXICILLIN-POT CLAVULANATE 875-125 MG PO TABS
1.0000 | ORAL_TABLET | Freq: Two times a day (BID) | ORAL | 0 refills | Status: AC
  Filled 2020-05-17: qty 20, 10d supply, fill #0

## 2020-05-17 MED ORDER — PALONOSETRON HCL INJECTION 0.25 MG/5ML
INTRAVENOUS | Status: AC
  Filled 2020-05-17: qty 5

## 2020-05-17 MED FILL — Allopurinol Tab 300 MG: ORAL | 30 days supply | Qty: 30 | Fill #0 | Status: AC

## 2020-05-17 NOTE — Progress Notes (Signed)
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 Previously seen in June 2021 and given course of doxycycline. Infectious workup including RPR, HIV, GC/chlamydia were negative.  2) 09/23/2019: presented to PCP with concern for bilaterally enlarged lymph nodes of the groin. Noted to have scant drainage 3) 10/12/2019: establish care with Dr. 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  5) 02/23/2020: Cycle 1 Day 1 of BV-CHP 6) 03/15/2020: Cycle 2 Day 1 of BV-CHP 7) 04/05/2020: Cycle 3 Day 1 of BV-CHP 8) 04/26/2020: Cycle 4 Day 1 of BV-CHP 9) 05/17/2020:  Cycle 5 Day 1 of BV-CHP  Interval History:  Tanner Gallagher 55 y.o. male with medical history significant for Anaplastic T Cell Lymphoma who presents for a follow up visit. The patient's last visit was on 04/26/2020. In the interim since the last visit the patient he has completed Cycle 4.  On exam today Tanner Gallagher notes he tolerated last cycle of chemotherapy quite well. His energy and appetite are stable. He is able to stay active and complete his ADLs on his own. He denies any nausea, vomiting or abdominal pain. He has daily bowel movements. Patient reports continued improvement of his perirectal abscess. He notes there is still draining of the abscess but pain has resolved. He has completed antibiotic therapy.  He denies any lumps or bumps elsewhere outside the lymphadenopathy in his groin area, which is improving significantly. Patient denies any fevers, chills, shortness of breath, chest pain or cough. He has no other complaints. Rest of the 10 point ROS is below.   MEDICAL HISTORY:  Past Medical History:  Diagnosis Date  . Cancer (HCC)     lymph nodes  . Chronic lower back pain 15 years  . Medical history non-contributory   . Tooth pain    no abscess or drainage per pt 02-01-2020    SURGICAL HISTORY: Past Surgical History:  Procedure Laterality Date  . INCISION AND DRAINAGE PERIRECTAL ABSCESS  06/16/2017  . INCISION AND DRAINAGE PERIRECTAL ABSCESS N/A 06/16/2017   Procedure: IRRIGATION AND DEBRIDEMENT PERIRECTAL ABSCESS;  Surgeon: Coralie Keens, MD;  Location: [facility];  Service: General;  Laterality: N/A;  . INGUINAL HERNIA REPAIR Right 2000s  . IR IMAGING GUIDED PORT INSERTION  02/15/2020  . LYMPH NODE BIOPSY Right 02/02/2020   Procedure: EXCISIONAL INGUINAL LYMPH NODE BIOPSY;  Surgeon: Leighton Ruff, MD;  Location: [facility];  Service: General;  Laterality: Right;  . PROCTOSCOPY  06/16/2017   Procedure: RIGID PROCTOSCOPY;  Surgeon: Coralie Keens, MD;  Location: [facility] OR;  Service: General;;    SOCIAL HISTORY: Social History   Socioeconomic History  . Marital status: Married    Spouse name: Not on file  . Number of children: Not on file  . Years of education: Not on file  . Highest education level: Not on file  Occupational History  . Not on file  Tobacco Use  . Smoking status: Former Smoker    Packs/day: 0.12    Years: 37.00    Pack years: 4.44    Types: Cigarettes    Quit date: 05/20/2017    Years since quitting: 2.9  . Smokeless tobacco: Never Used  Vaping Use  . Vaping Use:  Never used  Substance and Sexual Activity  . Alcohol use: Not Currently    Comment: rarew  . Drug use: Yes    Types: Marijuana    Comment: 02-01-2020 "once a day"  . Sexual activity: Yes  Other Topics Concern  . Not on file  Social History Narrative  . Not on file   Social Determinants of Health   Financial Resource Strain: Not on file  Food Insecurity: Not on file  Transportation Needs: Not on file  Physical Activity: Not on file  Stress: Not on file  Social Connections: Not on file  Intimate  Partner Violence: Not on file    FAMILY HISTORY: Family History  Problem Relation Age of Onset  . Diabetes Neg Hx     ALLERGIES:  has No Known Allergies.  MEDICATIONS:  Current Outpatient Medications  Medication Sig Dispense Refill  . acetaminophen (TYLENOL) 500 MG tablet Take 1,000 mg by mouth every 6 (six) hours as needed for mild pain, fever or headache.    . allopurinol (ZYLOPRIM) 300 MG tablet TAKE 1 TABLET (300 MG TOTAL) BY MOUTH DAILY. 30 tablet 3  . amoxicillin-clavulanate (AUGMENTIN) 875-125 MG tablet TAKE 1 TABLET BY MOUTH 2 (TWO) TIMES DAILY FOR 10 DAYS. 20 tablet 0  . lidocaine-prilocaine (EMLA) cream APPLY 1 APPLICATION TOPICALLY AS NEEDED. (Patient taking differently: Apply 1 application topically as needed (port access/hemmoroids).) 30 g 1  . ondansetron (ZOFRAN) 8 MG tablet TAKE 1 TABLET (8 MG TOTAL) BY MOUTH EVERY 8 (EIGHT) HOURS AS NEEDED FOR NAUSEA OR VOMITING. 20 tablet 2  . pantoprazole (PROTONIX) 40 MG tablet TAKE 1 TABLET (40 MG TOTAL) BY MOUTH DAILY. 30 tablet 3  . predniSONE (DELTASONE) 20 MG tablet TAKE 5 TABLETS (100 MG TOTAL) BY MOUTH DAILY WITH BREAKFAST. TAKE FOR 5 DAYS, STARTING ON DAY 1 OF YOUR CHEMOTHERAPY 100 tablet 3  . predniSONE (DELTASONE) 20 MG tablet TAKE 5 TABLETS (100 MG TOTAL) BY MOUTH DAILY WITH BREAKFAST. TAKE FOR 5 DAYS, STARTING ON DAY 1 OF YOUR CHEMOTHERAPY 100 tablet 3  . prochlorperazine (COMPAZINE) 10 MG tablet TAKE 1 TABLET (10 MG TOTAL) BY MOUTH EVERY 6 (SIX) HOURS AS NEEDED FOR NAUSEA OR VOMITING. 30 tablet 3  . senna-docusate (SENOKOT-S) 8.6-50 MG tablet Take 1 tablet by mouth 2 (two) times daily between meals as needed for mild constipation. 60 tablet 0   No current facility-administered medications for this visit.    REVIEW OF SYSTEMS:   Constitutional: ( - ) fevers, ( - )  chills , ( - ) night sweats Eyes: ( - ) blurriness of vision, ( - ) double vision, ( - ) watery eyes Ears, nose, mouth, throat, and face: ( - ) mucositis, ( -  ) sore throat Respiratory: ( - ) cough, ( - ) dyspnea, ( - ) wheezes Cardiovascular: ( - ) palpitation, ( - ) chest discomfort, ( - ) lower extremity swelling Gastrointestinal:  ( - ) nausea, ( - ) heartburn, ( - ) change in bowel habits Skin: ( - ) abnormal skin rashes Lymphatics: ( - ) new lymphadenopathy, ( - ) easy bruising Neurological: ( - ) numbness, ( - ) tingling, ( - ) new weaknesses Behavioral/Psych: ( - ) mood change, ( - ) new changes  All other systems were reviewed with the patient and are negative.  PHYSICAL EXAMINATION: ECOG PERFORMANCE STATUS: 0 - Asymptomatic  Vitals:   05/17/20 1100  BP: 107/67  Pulse: 74  Resp: 19  Temp: (!)  97.5 F (36.4 C)  SpO2: 100%   Filed Weights   05/17/20 1100  Weight: 147 lb 11.2 oz (67 kg)    GENERAL: well appearing middle aged [place] American male. alert, no distress and comfortable SKIN: skin color, texture, turgor are normal, no rashes or significant lesions EYES: conjunctiva are pink and non-injected, sclera clear LYMPH:  Deferred today LUNGS: clear to auscultation and percussion with normal breathing effort HEART: regular rate & rhythm and no murmurs and no lower extremity edema Musculoskeletal: no cyanosis of digits and no clubbing  PSYCH: alert & oriented x 3, fluent speech NEURO: no focal motor/sensory deficits  LABORATORY DATA:  I have reviewed the data as listed CBC Latest Ref Rng & Units 05/17/2020 04/26/2020 04/20/2020  WBC 4.0 - 10.5 K/uL 5.6 7.1 3.8(L)  Hemoglobin 13.0 - 17.0 g/dL 10.5(L) 11.6(L) 10.7(L)  Hematocrit 39.0 - 52.0 % 31.7(L) 35.0(L) 32.2(L)  Platelets 150 - 400 K/uL 449(H) 381 279    CMP Latest Ref Rng & Units 04/26/2020 04/20/2020 04/19/2020  Glucose 70 - 99 mg/dL 117(H) 118(H) 90  BUN 6 - 20 mg/dL $Remove'20 13 17  'CSPngMs$ Creatinine 0.61 - 1.24 mg/dL 0.89 0.98 1.03  Sodium 135 - 145 mmol/L 139 139 137  Potassium 3.5 - 5.1 mmol/L 4.5 4.1 4.3  Chloride 98 - 111 mmol/L 105 108 104  CO2 22 - 32 mmol/L $RemoveB'25 24 24   'rnugPskk$ Calcium 8.9 - 10.3 mg/dL 9.1 8.7(L) 8.8(L)  Total Protein 6.5 - 8.1 g/dL 7.4 - 6.7  Total Bilirubin 0.3 - 1.2 mg/dL <0.2(L) - 0.7  Alkaline Phos 38 - 126 U/L 54 - 43  AST 15 - 41 U/L 21 - 15  ALT 0 - 44 U/L 22 - 13    RADIOGRAPHIC STUDIES: CT PELVIS W CONTRAST  Result Date: 04/18/2020 CLINICAL DATA:  Perirectal abscess.  Lymphoma, recent chemotherapy. EXAM: CT PELVIS WITH CONTRAST TECHNIQUE: Multidetector CT imaging of the pelvis was performed using the standard protocol following the bolus administration of intravenous contrast. CONTRAST:  179mL OMNIPAQUE IOHEXOL 300 MG/ML  SOLN COMPARISON:  Multiple exams, including PET-CT from 02/24/2020 and CT pelvis 10/21/2019 FINDINGS: Urinary Tract:  Unremarkable Bowel: Ill definition of perianal soft tissues, probably inflammatory. Potential fluid collections bilaterally in the ischiorectal fossa, measuring about 2.6 by 1.0 by 2.0 cm (volume = 2.7 cm^3) on the left and about 1.6 by 0.9 by 1.5 cm (volume = 1 cm^3) on the right on image 40 of series 2. Abnormal bandlike stranding extends from the external sphincter region at the 3 o'clock position into the subcutaneous tissues of the left medial buttock on images 13 through 16 of series 10, and appears more prominent than on 10/21/2019. Although I not see definite gas density tracking in this region, I cannot exclude a perianal fistula. Vascular/Lymphatic: Aortoiliac atherosclerotic vascular disease. Substantially reduced inguinal adenopathy compared to 10/21/2019 and 02/24/2020, a right inguinal lymph node measures 1.0 cm in short axis on image 31 of series 2, previously 2.0 cm on 02/24/2020. Reproductive:  Unremarkable Other:  No supplemental non-categorized findings. Musculoskeletal: Prominent arthropathy of both hips with prominent spurring and moderate to prominent loss of articular cartilage. Extensive degenerative subcortical cyst formation along the femoral heads and acetabula. IMPRESSION: 1. Small fluid  density collections along the ischiorectal fossa bilaterally suspicious for small abscesses. Ill definition of tissue planes between the anus and external sphincter probably from inflammation. Increased confluence of a band of density extending from about the 3 o'clock position of the left anal region  into the subcutaneous tissues along the medial left buttock, this could be an inflammatory band but perianal fistula is not excluded. Clinically warranted, perianal fistula protocol MRI could be utilized for further characterization of the region. 2.  Aortic Atherosclerosis (ICD10-I70.0). 3. Substantially reduced pelvic adenopathy compared to 02/24/2020 PET-CT 4. Prominent arthropathy of both hips. Electronically Signed   By: Van Clines M.D.   On: 04/18/2020 10:27   MR PELVIS W WO CONTRAST  Result Date: 04/18/2020 CLINICAL DATA:  Perianal abscess, with increased rectal pain. Currently undergoing chemotherapy for T-cell lymphoma EXAM: MRI PELVIS WITHOUT AND WITH CONTRAST TECHNIQUE: Multiplanar multisequence MR imaging of the pelvis was performed both before and after administration of intravenous contrast. CONTRAST:  59mL GADAVIST GADOBUTROL 1 MMOL/ML IV SOLN COMPARISON:  PET-CT on 02/24/2020 FINDINGS: Lower Urinary Tract: Unremarkable urinary bladder. Bowel: A complex transsphincteric fistula is seen which arises from the left posterior wall of the upper anal canal, near the 4 o'clock position (image 22/6). This becomes complex with branching tracts which extend posteriorly and laterally into the bilateral ischioanal fossae. These also show mild bilateral supralevator extension. Associated abscesses are seen in the left lower ischiorectal fossa measuring 3.5 x 1.7 cm, and in the right upper ischiorectal fossa measuring 2.2 x 1.5 cm. A 2nd simple intersphincteric fistula is seen arising from the posterior wall of the inferior anal canal at the 5 o'clock position (image 26/6). This fistula extends posteriorly  and inferiorly into the gluteal crease. No associated abscess. Vascular/Lymphatic: Bilateral inguinal lymphadenopathy is incompletely visualized on this exam, but appears decreased compared to recent PET-CT. Reproductive: Unremarkable appearance of prostate and seminal vesicles. Other: None. Musculoskeletal: No significant abnormality identified. 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 (Grade 5) 2nd simple intersphincteric fistula, which extends posteriorly and inferiorly into the gluteal crease. No associated abscess. (Grade 1) Bilateral inguinal lymphadenopathy is incompletely visualized, but appears decreased compared to recent PET-CT. Electronically Signed   By: Marlaine Hind M.D.   On: 04/18/2020 14:38    ASSESSMENT & PLAN Tanner Gallagher 55 y.o. male with no significant medical history presents for evaluation of Anaplastic T Cell Lymphoma.  Review the labs, review the records, discussion with the patient the findings are most consistent with Anaplastic T Cell Lymphoma.     PET CT scan performed on 02/24/2020 confirmed Stage III disease with involvement of the axillary and inguinal lymph nodes. He has also had a port placed and echocardiogram performed.   The treatment of choice for this patient's cancer would be BV-CHP chemotherapy.  This regimen consists of doxorubicin 50 mg per metered squared on day 1, cyclophosphamide 750 mg per metered squared on day 1, and brentuximab 1.8 mg/kg on day 1 of a 21-day cycle. We will proceed for 6 cycles.  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  --port had been placed, TTE shows adequate heart function for anthracycline therapy.  --PET CT scan shows involvement of the inguinal/axillary lymph nodes. Findings consistent with Stage III disease.  --plan for BV-CHP chemotherapy as noted above.  Cycle 1 Day 1 of treatment started on 02/23/2020. Today is Cycle 5 Day 1. Labs from today were reviewed without any intervention needed.  --RTC on 06/07/2020 for Cycle 6 Day 1 of treatment  #Supportive Care --zofran $RemoveBefor'8mg'zyhRPdGMhlZe$  q8H PRN and compazine $RemoveBefor'10mg'aAZDqOmMwIFJ$  PO q6H for nausea -- allopurinol $RemoveBefore'300mg'gPQjjCVCumGrb$  PO daily for TLS  prophylaxis -- EMLA cream for port -- no pain medication required at this time.   # Perirectal abscess: --Per Dr. Charm Stenner Limbo, recommended to decrease Prednisone from 100 mg to 60 mg PO on days 1-5.   --Refilled 10 day course of Augmentin since abscess is still draining and risk of neutropenia with chemotherapy.    No orders of the defined types were placed in this encounter.   All questions were answered. The patient knows to call the clinic with any problems, questions or concerns.  A total of more than 30 minutes were spent on this encounter and over half of that time was spent on counseling and coordination of care as outlined above.   Dede Query PA-C Department of Hematology/Oncology [place] at [facility] Phone: [phone] Pager: [phone] Email: [email or web address]  05/17/2020 11:29 AM   Literature Support:  Lynden Ang OA, [place] B, [place], [place], [place], [place], [place], Morschhauser F, Domingo-Domenech E, Margot Chimes [address] D, [place] , [place], [place], [place] SP, Shustov A, Httmann A, [place], Yuen S, Iyer S, Zinzani PL, Hua Z, [place] M, Romelle Starcher T, Trmper L; [place] Study Group. 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 Lancet. 2019 Jan 19;393(10168):229-240.   --Median progression-free survival was 482 months (95% CI 352-not evaluable) in the A+CHP group and 208 months (127-476) in the CHOP group (hazard ratio 071 [95% CI 054-093], p=[id number]).    --Front-line treatment with A+CHP is superior to CHOP for patients with CD30-positive peripheral T-cell lymphomas as shown by a significant improvement in progression-free survival and overall survival with a manageable safety profile.   Patient was seen with Dr. Degan Hanser Limbo.    ADDENDUM   .Patient was Personally and independently interviewed, examined and relevant elements of the history of present illness were reviewed in details and an assessment and plan was created. All elements of the patient's history of present illness , assessment and plan were discussed in details with Dede Query PA-C. The above documentation reflects our combined findings assessment and plan. Patient with no prohibitive toxicities to BV-CHP. Labs reviewed. Orders reviewed and signed. Augmentin to cover patient against flare of perianal infection during to treatment related neutropenia.  Sullivan Lone MD MS

## 2020-05-19 ENCOUNTER — Other Ambulatory Visit: Payer: Self-pay

## 2020-05-19 ENCOUNTER — Inpatient Hospital Stay: Payer: Self-pay

## 2020-05-19 VITALS — BP 115/80 | HR 77 | Temp 97.9°F | Resp 18

## 2020-05-19 MED ORDER — PEGFILGRASTIM-CBQV 6 MG/0.6ML ~~LOC~~ SOSY
6.0000 mg | PREFILLED_SYRINGE | Freq: Once | SUBCUTANEOUS | Status: AC
  Administered 2020-05-19: 6 mg via SUBCUTANEOUS

## 2020-05-19 NOTE — Patient Instructions (Signed)
Pegfilgrastim injection What is this medicine? PEGFILGRASTIM (PEG fil gra stim) is a long-acting granulocyte colony-stimulating factor that stimulates the growth of neutrophils, a type of white blood cell important in the body's fight against infection. It is used to reduce the incidence of fever and infection in patients with certain types of cancer who are receiving chemotherapy that affects the bone marrow, and to increase survival after being exposed to high doses of radiation. This medicine may be used for other purposes; ask your health care provider or pharmacist if you have questions. COMMON BRAND NAME(S): Fulphila, Neulasta, Nyvepria, UDENYCA, Ziextenzo What should I tell my health care provider before I take this medicine? They need to know if you have any of these conditions:  kidney disease  latex allergy  ongoing radiation therapy  sickle cell disease  skin reactions to acrylic adhesives (On-Body Injector only)  an unusual or allergic reaction to pegfilgrastim, filgrastim, other medicines, foods, dyes, or preservatives  pregnant or trying to get pregnant  breast-feeding How should I use this medicine? This medicine is for injection under the skin. If you get this medicine at home, you will be taught how to prepare and give the pre-filled syringe or how to use the On-body Injector. Refer to the patient Instructions for Use for detailed instructions. Use exactly as directed. Tell your healthcare provider immediately if you suspect that the On-body Injector may not have performed as intended or if you suspect the use of the On-body Injector resulted in a missed or partial dose. It is important that you put your used needles and syringes in a special sharps container. Do not put them in a trash can. If you do not have a sharps container, call your pharmacist or healthcare provider to get one. Talk to your pediatrician regarding the use of this medicine in children. While this drug  may be prescribed for selected conditions, precautions do apply. Overdosage: If you think you have taken too much of this medicine contact a poison control center or emergency room at once. NOTE: This medicine is only for you. Do not share this medicine with others. What if I miss a dose? It is important not to miss your dose. Call your doctor or health care professional if you miss your dose. If you miss a dose due to an On-body Injector failure or leakage, a new dose should be administered as soon as possible using a single prefilled syringe for manual use. What may interact with this medicine? Interactions have not been studied. This list may not describe all possible interactions. Give your health care provider a list of all the medicines, herbs, non-prescription drugs, or dietary supplements you use. Also tell them if you smoke, drink alcohol, or use illegal drugs. Some items may interact with your medicine. What should I watch for while using this medicine? Your condition will be monitored carefully while you are receiving this medicine. You may need blood work done while you are taking this medicine. Talk to your health care provider about your risk of cancer. You may be more at risk for certain types of cancer if you take this medicine. If you are going to need a MRI, CT scan, or other procedure, tell your doctor that you are using this medicine (On-Body Injector only). What side effects may I notice from receiving this medicine? Side effects that you should report to your doctor or health care professional as soon as possible:  allergic reactions (skin rash, itching or hives, swelling of   the face, lips, or tongue)  back pain  dizziness  fever  pain, redness, or irritation at site where injected  pinpoint red spots on the skin  red or dark-brown urine  shortness of breath or breathing problems  stomach or side pain, or pain at the shoulder  swelling  tiredness  trouble  passing urine or change in the amount of urine  unusual bruising or bleeding Side effects that usually do not require medical attention (report to your doctor or health care professional if they continue or are bothersome):  bone pain  muscle pain This list may not describe all possible side effects. Call your doctor for medical advice about side effects. You may report side effects to FDA at 1-800-FDA-1088. Where should I keep my medicine? Keep out of the reach of children. If you are using this medicine at home, you will be instructed on how to store it. Throw away any unused medicine after the expiration date on the label. NOTE: This sheet is a summary. It may not cover all possible information. If you have questions about this medicine, talk to your doctor, pharmacist, or health care provider.  2021 Elsevier/Gold Standard (2019-01-28 13:20:51)  

## 2020-06-07 ENCOUNTER — Other Ambulatory Visit: Payer: Self-pay

## 2020-06-07 ENCOUNTER — Inpatient Hospital Stay (HOSPITAL_BASED_OUTPATIENT_CLINIC_OR_DEPARTMENT_OTHER): Payer: Self-pay | Admitting: Hematology and Oncology

## 2020-06-07 ENCOUNTER — Encounter: Payer: Self-pay | Admitting: Nutrition

## 2020-06-07 ENCOUNTER — Inpatient Hospital Stay: Payer: Self-pay

## 2020-06-07 ENCOUNTER — Inpatient Hospital Stay: Payer: Self-pay | Attending: Hematology and Oncology

## 2020-06-07 VITALS — BP 115/74 | HR 68 | Temp 97.2°F | Resp 18 | Ht 68.0 in | Wt 147.4 lb

## 2020-06-07 DIAGNOSIS — Z5112 Encounter for antineoplastic immunotherapy: Secondary | ICD-10-CM | POA: Insufficient documentation

## 2020-06-07 DIAGNOSIS — Z95828 Presence of other vascular implants and grafts: Secondary | ICD-10-CM

## 2020-06-07 DIAGNOSIS — C8442 Peripheral T-cell lymphoma, not classified, intrathoracic lymph nodes: Secondary | ICD-10-CM

## 2020-06-07 DIAGNOSIS — Z87891 Personal history of nicotine dependence: Secondary | ICD-10-CM | POA: Insufficient documentation

## 2020-06-07 DIAGNOSIS — Z5189 Encounter for other specified aftercare: Secondary | ICD-10-CM | POA: Insufficient documentation

## 2020-06-07 DIAGNOSIS — C8460 Anaplastic large cell lymphoma, ALK-positive, unspecified site: Secondary | ICD-10-CM

## 2020-06-07 DIAGNOSIS — Z5111 Encounter for antineoplastic chemotherapy: Secondary | ICD-10-CM | POA: Insufficient documentation

## 2020-06-07 DIAGNOSIS — C8440 Peripheral T-cell lymphoma, not classified, unspecified site: Secondary | ICD-10-CM | POA: Insufficient documentation

## 2020-06-07 DIAGNOSIS — Z79899 Other long term (current) drug therapy: Secondary | ICD-10-CM | POA: Insufficient documentation

## 2020-06-07 LAB — CBC WITH DIFFERENTIAL (CANCER CENTER ONLY)
Abs Immature Granulocytes: 0.04 10*3/uL (ref 0.00–0.07)
Basophils Absolute: 0.1 10*3/uL (ref 0.0–0.1)
Basophils Relative: 1 %
Eosinophils Absolute: 0.1 10*3/uL (ref 0.0–0.5)
Eosinophils Relative: 2 %
HCT: 32.4 % — ABNORMAL LOW (ref 39.0–52.0)
Hemoglobin: 10.6 g/dL — ABNORMAL LOW (ref 13.0–17.0)
Immature Granulocytes: 1 %
Lymphocytes Relative: 21 %
Lymphs Abs: 1.1 10*3/uL (ref 0.7–4.0)
MCH: 31.4 pg (ref 26.0–34.0)
MCHC: 32.7 g/dL (ref 30.0–36.0)
MCV: 95.9 fL (ref 80.0–100.0)
Monocytes Absolute: 0.5 10*3/uL (ref 0.1–1.0)
Monocytes Relative: 10 %
Neutro Abs: 3.3 10*3/uL (ref 1.7–7.7)
Neutrophils Relative %: 65 %
Platelet Count: 261 10*3/uL (ref 150–400)
RBC: 3.38 MIL/uL — ABNORMAL LOW (ref 4.22–5.81)
RDW: 18.1 % — ABNORMAL HIGH (ref 11.5–15.5)
WBC Count: 5.1 10*3/uL (ref 4.0–10.5)
nRBC: 0 % (ref 0.0–0.2)

## 2020-06-07 LAB — CMP (CANCER CENTER ONLY)
ALT: 24 U/L (ref 0–44)
AST: 22 U/L (ref 15–41)
Albumin: 3.3 g/dL — ABNORMAL LOW (ref 3.5–5.0)
Alkaline Phosphatase: 68 U/L (ref 38–126)
Anion gap: 6 (ref 5–15)
BUN: 15 mg/dL (ref 6–20)
CO2: 29 mmol/L (ref 22–32)
Calcium: 9.3 mg/dL (ref 8.9–10.3)
Chloride: 106 mmol/L (ref 98–111)
Creatinine: 0.74 mg/dL (ref 0.61–1.24)
GFR, Estimated: >60 mL/min (ref >60)
Glucose, Bld: 114 mg/dL — ABNORMAL HIGH (ref 70–99)
Potassium: 4 mmol/L (ref 3.5–5.1)
Sodium: 141 mmol/L (ref 135–145)
Total Bilirubin: 0.3 mg/dL (ref 0.3–1.2)
Total Protein: 6.7 g/dL (ref 6.5–8.1)

## 2020-06-07 LAB — LACTATE DEHYDROGENASE: LDH: 169 U/L (ref 98–192)

## 2020-06-07 LAB — URIC ACID: Uric Acid, Serum: 3 mg/dL — ABNORMAL LOW (ref 3.7–8.6)

## 2020-06-07 MED ORDER — DOXORUBICIN HCL CHEMO IV INJECTION 2 MG/ML
50.0000 mg/m2 | Freq: Once | INTRAVENOUS | Status: AC
  Administered 2020-06-07: 88 mg via INTRAVENOUS
  Filled 2020-06-07: qty 44

## 2020-06-07 MED ORDER — ACETAMINOPHEN 325 MG PO TABS
ORAL_TABLET | ORAL | Status: AC
  Filled 2020-06-07: qty 2

## 2020-06-07 MED ORDER — ACETAMINOPHEN 325 MG PO TABS
650.0000 mg | ORAL_TABLET | Freq: Once | ORAL | Status: AC
  Administered 2020-06-07: 650 mg via ORAL

## 2020-06-07 MED ORDER — DIPHENHYDRAMINE HCL 25 MG PO CAPS
ORAL_CAPSULE | ORAL | Status: AC
  Filled 2020-06-07: qty 2

## 2020-06-07 MED ORDER — HEPARIN SOD (PORK) LOCK FLUSH 100 UNIT/ML IV SOLN
500.0000 [IU] | Freq: Once | INTRAVENOUS | Status: AC | PRN
  Administered 2020-06-07: 500 [IU]
  Filled 2020-06-07: qty 5

## 2020-06-07 MED ORDER — SODIUM CHLORIDE 0.9% FLUSH
10.0000 mL | INTRAVENOUS | Status: DC | PRN
Start: 2020-06-07 — End: 2020-06-07
  Administered 2020-06-07: 10 mL
  Filled 2020-06-07: qty 10

## 2020-06-07 MED ORDER — SODIUM CHLORIDE 0.9 % IV SOLN
150.0000 mg | Freq: Once | INTRAVENOUS | Status: AC
  Administered 2020-06-07: 150 mg via INTRAVENOUS
  Filled 2020-06-07: qty 150

## 2020-06-07 MED ORDER — SODIUM CHLORIDE 0.9 % IV SOLN
750.0000 mg/m2 | Freq: Once | INTRAVENOUS | Status: AC
  Administered 2020-06-07: 1320 mg via INTRAVENOUS
  Filled 2020-06-07: qty 66

## 2020-06-07 MED ORDER — SODIUM CHLORIDE 0.9 % IV SOLN
1.8000 mg/kg | Freq: Once | INTRAVENOUS | Status: AC
  Administered 2020-06-07: 115 mg via INTRAVENOUS
  Filled 2020-06-07: qty 23

## 2020-06-07 MED ORDER — PALONOSETRON HCL INJECTION 0.25 MG/5ML
INTRAVENOUS | Status: AC
  Filled 2020-06-07: qty 5

## 2020-06-07 MED ORDER — PALONOSETRON HCL INJECTION 0.25 MG/5ML
0.2500 mg | Freq: Once | INTRAVENOUS | Status: AC
  Administered 2020-06-07: 0.25 mg via INTRAVENOUS

## 2020-06-07 MED ORDER — DIPHENHYDRAMINE HCL 50 MG/ML IJ SOLN
INTRAMUSCULAR | Status: AC
  Filled 2020-06-07: qty 1

## 2020-06-07 MED ORDER — DIPHENHYDRAMINE HCL 50 MG/ML IJ SOLN
50.0000 mg | Freq: Once | INTRAMUSCULAR | Status: AC
  Administered 2020-06-07: 50 mg via INTRAVENOUS

## 2020-06-07 MED ORDER — DEXAMETHASONE SODIUM PHOSPHATE 100 MG/10ML IJ SOLN
10.0000 mg | Freq: Once | INTRAMUSCULAR | Status: AC
  Administered 2020-06-07: 10 mg via INTRAVENOUS
  Filled 2020-06-07: qty 10

## 2020-06-07 MED ORDER — SODIUM CHLORIDE 0.9% FLUSH
10.0000 mL | Freq: Once | INTRAVENOUS | Status: AC
  Administered 2020-06-07: 10 mL
  Filled 2020-06-07: qty 10

## 2020-06-07 MED ORDER — SODIUM CHLORIDE 0.9 % IV SOLN
Freq: Once | INTRAVENOUS | Status: AC
  Filled 2020-06-07: qty 250

## 2020-06-07 NOTE — Patient Instructions (Signed)
[facility] ONCOLOGY  Discharge Instructions: Thank you for choosing [facility] to provide your oncology and hematology care.   If you have a lab appointment with the [facility], please go directly to the [facility] and check in at the registration area.   Wear comfortable clothing and clothing appropriate for easy access to any Portacath or PICC line.   We strive to give you quality time with your provider. You may need to reschedule your appointment if you arrive late (15 or more minutes).  Arriving late affects you and other patients whose appointments are after yours.  Also, if you miss three or more appointments without notifying the office, you may be dismissed from the clinic at the provider's discretion.      For prescription refill requests, have your pharmacy contact our office and allow 72 hours for refills to be completed.    Today you received the following chemotherapy and/or immunotherapy agents: Doxorubicin (Adrimycin), Cyclophosphamide (Cytoxan), and Brentuximab (Adcetris)     To help prevent nausea and vomiting after your treatment, we encourage you to take your nausea medication as directed.  BELOW ARE SYMPTOMS THAT SHOULD BE REPORTED IMMEDIATELY: . *FEVER GREATER THAN 100.4 F (38 C) OR HIGHER . *CHILLS OR SWEATING . *NAUSEA AND VOMITING THAT IS NOT CONTROLLED WITH YOUR NAUSEA MEDICATION . *UNUSUAL SHORTNESS OF BREATH . *UNUSUAL BRUISING OR BLEEDING . *URINARY PROBLEMS (pain or burning when urinating, or frequent urination) . *BOWEL PROBLEMS (unusual diarrhea, constipation, pain near the anus) . TENDERNESS IN MOUTH AND THROAT WITH OR WITHOUT PRESENCE OF ULCERS (sore throat, sores in mouth, or a toothache) . UNUSUAL RASH, SWELLING OR PAIN  . UNUSUAL VAGINAL DISCHARGE OR ITCHING   Items with * indicate a potential emergency and should be followed up as soon as possible or go to the Emergency Department if any problems should  occur.  Please show the CHEMOTHERAPY ALERT CARD or IMMUNOTHERAPY ALERT CARD at check-in to the Emergency Department and triage nurse.  Should you have questions after your visit or need to cancel or reschedule your appointment, please contact [facility]  Dept: [phone]  and follow the prompts.  Office hours are 8:00 a.m. to 4:30 p.m. Monday - Friday. Please note that voicemails left after 4:00 p.m. may not be returned until the following business day.  We are closed weekends and major holidays. You have access to a nurse at all times for urgent questions. Please call the main number to the clinic Dept: [phone] and follow the prompts.   For any non-urgent questions, you may also contact your provider using MyChart. We now offer e-Visits for anyone 54 and older to request care online for non-urgent symptoms. For details visit [email or web address].   Also download the MyChart app! Go to the app store, search "MyChart", open the app, select [facility], and log in with your MyChart username and password.  Due to Covid, a mask is required upon entering the hospital/clinic. If you do not have a mask, one will be given to you upon arrival. For doctor visits, patients may have 1 support person aged 54 or older with them. For treatment visits, patients cannot have anyone with them due to current Covid guidelines and our immunocompromised population.

## 2020-06-07 NOTE — Progress Notes (Signed)
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 Previously seen in June 2021 and given course of doxycycline. Infectious workup including RPR, HIV, GC/chlamydia were negative.  2) 09/23/2019: presented to PCP with concern for bilaterally enlarged lymph nodes of the groin. Noted to have scant drainage 3) 10/12/2019: establish care with Dr. 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  5) 02/23/2020: Cycle 1 Day 1 of BV-CHP 6) 03/15/2020: Cycle 2 Day 1 of BV-CHP 7) 04/05/2020: Cycle 3 Day 1 of BV-CHP 8) 04/26/2020: Cycle 4 Day 1 of BV-CHP 9) 05/17/2020:  Cycle 5 Day 1 of BV-CHP 10) 06/07/2020: Cycle 6 Day 1 of BV-CHP  Interval History:  Tanner Gallagher 55 y.o. male with medical history significant for Anaplastic T Cell Lymphoma who presents for a follow up visit. The patient's last visit was on 05/17/2020 with Dede Query. In the interim since the last visit the patient he has completed Cycle 5.  On exam today Tanner Gallagher notes he feels well overall.  He does endorse having a few episodes of nausea after the last cycle, but these were easily resolved with his as needed medication.  He reports that he has had near resolution of the lymphadenopathy in his groin.  He reports his appetite is good and his bowels have been moving well.  He also notes that the abscess on his backside continues to drain there was considerably smaller than previous.  He is currently willing and able to proceed with his next cycle of chemotherapy.  This should be last cycle prior to his posttreatment PET CT scan.  Otherwise he denies any fevers, chills, sweats, vomiting or diarrhea. A full 10 point ROS is listed  below.  MEDICAL HISTORY:  Past Medical History:  Diagnosis Date  . Cancer (HCC)    lymph nodes  . Chronic lower back pain 15 years  . Medical history non-contributory   . Tooth pain    no abscess or drainage per pt 02-01-2020    SURGICAL HISTORY: Past Surgical History:  Procedure Laterality Date  . INCISION AND DRAINAGE PERIRECTAL ABSCESS  06/16/2017  . INCISION AND DRAINAGE PERIRECTAL ABSCESS N/A 06/16/2017   Procedure: IRRIGATION AND DEBRIDEMENT PERIRECTAL ABSCESS;  Surgeon: Coralie Keens, MD;  Location: [facility];  Service: General;  Laterality: N/A;  . INGUINAL HERNIA REPAIR Right 2000s  . IR IMAGING GUIDED PORT INSERTION  02/15/2020  . LYMPH NODE BIOPSY Right 02/02/2020   Procedure: EXCISIONAL INGUINAL LYMPH NODE BIOPSY;  Surgeon: Leighton Ruff, MD;  Location: [facility];  Service: General;  Laterality: Right;  . PROCTOSCOPY  06/16/2017   Procedure: RIGID PROCTOSCOPY;  Surgeon: Coralie Keens, MD;  Location: [facility] OR;  Service: General;;    SOCIAL HISTORY: Social History   Socioeconomic History  . Marital status: Married    Spouse name: Not on file  . Number of children: Not on file  . Years of education: Not on file  . Highest education level: Not on file  Occupational History  . Not on file  Tobacco Use  . Smoking status: Former Smoker    Packs/day: 0.12    Years: 37.00    Pack years: 4.44    Types: Cigarettes  Quit date: 05/20/2017    Years since quitting: 3.0  . Smokeless tobacco: Never Used  Vaping Use  . Vaping Use: Never used  Substance and Sexual Activity  . Alcohol use: Not Currently    Comment: rarew  . Drug use: Yes    Types: Marijuana    Comment: 02-01-2020 "once a day"  . Sexual activity: Yes  Other Topics Concern  . Not on file  Social History Narrative  . Not on file   Social Determinants of Health   Financial Resource Strain: Not on file  Food Insecurity: Not on file  Transportation Needs: Not on file  Physical  Activity: Not on file  Stress: Not on file  Social Connections: Not on file  Intimate Partner Violence: Not on file    FAMILY HISTORY: Family History  Problem Relation Age of Onset  . Diabetes Neg Hx     ALLERGIES:  has No Known Allergies.  MEDICATIONS:  Current Outpatient Medications  Medication Sig Dispense Refill  . acetaminophen (TYLENOL) 500 MG tablet Take 1,000 mg by mouth every 6 (six) hours as needed for mild pain, fever or headache.    . allopurinol (ZYLOPRIM) 300 MG tablet TAKE 1 TABLET (300 MG TOTAL) BY MOUTH DAILY. 30 tablet 3  . lidocaine-prilocaine (EMLA) cream APPLY 1 APPLICATION TOPICALLY AS NEEDED. (Patient taking differently: Apply 1 application topically as needed (port access/hemmoroids).) 30 g 1  . ondansetron (ZOFRAN) 8 MG tablet TAKE 1 TABLET (8 MG TOTAL) BY MOUTH EVERY 8 (EIGHT) HOURS AS NEEDED FOR NAUSEA OR VOMITING. 20 tablet 2  . pantoprazole (PROTONIX) 40 MG tablet TAKE 1 TABLET (40 MG TOTAL) BY MOUTH DAILY. 30 tablet 3  . predniSONE (DELTASONE) 20 MG tablet TAKE 5 TABLETS (100 MG TOTAL) BY MOUTH DAILY WITH BREAKFAST. TAKE FOR 5 DAYS, STARTING ON DAY 1 OF YOUR CHEMOTHERAPY 100 tablet 3  . predniSONE (DELTASONE) 20 MG tablet TAKE 5 TABLETS (100 MG TOTAL) BY MOUTH DAILY WITH BREAKFAST. TAKE FOR 5 DAYS, STARTING ON DAY 1 OF YOUR CHEMOTHERAPY 100 tablet 3  . prochlorperazine (COMPAZINE) 10 MG tablet TAKE 1 TABLET (10 MG TOTAL) BY MOUTH EVERY 6 (SIX) HOURS AS NEEDED FOR NAUSEA OR VOMITING. 30 tablet 3  . senna-docusate (SENOKOT-S) 8.6-50 MG tablet Take 1 tablet by mouth 2 (two) times daily between meals as needed for mild constipation. 60 tablet 0   No current facility-administered medications for this visit.   Facility-Administered Medications Ordered in Other Visits  Medication Dose Route Frequency Provider Last Rate Last Admin  . sodium chloride flush (NS) 0.9 % injection 10 mL  10 mL Intracatheter PRN Orson Slick, MD   10 mL at 06/07/20 1511     REVIEW OF SYSTEMS:   Constitutional: ( - ) fevers, ( - )  chills , ( - ) night sweats Eyes: ( - ) blurriness of vision, ( - ) double vision, ( - ) watery eyes Ears, nose, mouth, throat, and face: ( - ) mucositis, ( - ) sore throat Respiratory: ( - ) cough, ( - ) dyspnea, ( - ) wheezes Cardiovascular: ( - ) palpitation, ( - ) chest discomfort, ( - ) lower extremity swelling Gastrointestinal:  ( - ) nausea, ( - ) heartburn, ( - ) change in bowel habits Skin: ( - ) abnormal skin rashes Lymphatics: ( - ) new lymphadenopathy, ( - ) easy bruising Neurological: ( - ) numbness, ( - ) tingling, ( - ) new weaknesses Behavioral/Psych: ( - )  mood change, ( - ) new changes  All other systems were reviewed with the patient and are negative.  PHYSICAL EXAMINATION: ECOG PERFORMANCE STATUS: 0 - Asymptomatic  Vitals:   06/07/20 1041  BP: 115/74  Pulse: 68  Resp: 18  Temp: (!) 97.2 F (36.2 C)  SpO2: 100%   Filed Weights   06/07/20 1041  Weight: 147 lb 6.4 oz (66.9 kg)    GENERAL: well appearing middle aged [place] American male. alert, no distress and comfortable SKIN: skin color, texture, turgor are normal, no rashes or significant lesions EYES: conjunctiva are pink and non-injected, sclera clear LYMPH:  Deferred today LUNGS: clear to auscultation and percussion with normal breathing effort HEART: regular rate & rhythm and no murmurs and no lower extremity edema Musculoskeletal: no cyanosis of digits and no clubbing  PSYCH: alert & oriented x 3, fluent speech NEURO: no focal motor/sensory deficits  LABORATORY DATA:  I have reviewed the data as listed CBC Latest Ref Rng & Units 06/07/2020 05/17/2020 04/26/2020  WBC 4.0 - 10.5 K/uL 5.1 5.6 7.1  Hemoglobin 13.0 - 17.0 g/dL 10.6(L) 10.5(L) 11.6(L)  Hematocrit 39.0 - 52.0 % 32.4(L) 31.7(L) 35.0(L)  Platelets 150 - 400 K/uL 261 449(H) 381    CMP Latest Ref Rng & Units 06/07/2020 05/17/2020 04/26/2020  Glucose 70 - 99 mg/dL 114(H) 107(H)  117(H)  BUN 6 - 20 mg/dL 15 15 20   Creatinine 0.61 - 1.24 mg/dL 0.74 0.78 0.89  Sodium 135 - 145 mmol/L 141 141 139  Potassium 3.5 - 5.1 mmol/L 4.0 4.2 4.5  Chloride 98 - 111 mmol/L 106 104 105  CO2 22 - 32 mmol/L 29 30 25   Calcium 8.9 - 10.3 mg/dL 9.3 9.2 9.1  Total Protein 6.5 - 8.1 g/dL 6.7 6.7 7.4  Total Bilirubin 0.3 - 1.2 mg/dL 0.3 <0.2(L) <0.2(L)  Alkaline Phos 38 - 126 U/L 68 56 54  AST 15 - 41 U/L 22 22 21   ALT 0 - 44 U/L 24 15 22     RADIOGRAPHIC STUDIES: No results found.  ASSESSMENT & PLAN Tanner Gallagher 55 y.o. male with no significant medical history presents for evaluation of Anaplastic T Cell Lymphoma.  Review the labs, review the records, discussion with the patient the findings are most consistent with Anaplastic T Cell Lymphoma.     PET CT scan performed on 02/24/2020 confirmed Stage III disease with involvement of the axillary and inguinal lymph nodes. He has also had a port placed and echocardiogram performed.   Previously Tanner Gallagher has had the opportunity to ask any questions or concerns that he has had about his disease or chemotherapy moving forward.  He notes that he is aware of the risks and benefits of treatment and is willing and able to proceed with treatment today.  The treatment of choice for this patient's cancer would be BV-CHP chemotherapy.  This regimen consists of doxorubicin 50 mg per metered squared on day 1, cyclophosphamide 750 mg per metered squared on day 1, and brentuximab 1.8 mg/kg on day 1 of a 21-day cycle.  The patient also received prednisone 60mg  PO on days 1-5.  We will proceed for 6 cycles.  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  --port had been placed, TTE shows adequate heart function for anthracycline therapy.  --PET CT scan shows involvement of the inguinal/axillary lymph nodes. Findings consistent with Stage III disease.  --plan for BV-CHP  chemotherapy as noted above. Cycle 1 Day 1 of treatment started on 02/23/2020. Today is Cycle 6 Day 1.  --RTC on 07/05/2020 with interval PET CT scan.   #Supportive Care --zofran 8mg  q8H PRN and compazine 10mg  PO q6H for nausea -- allopurinol 300mg  PO daily for TLS prophylaxis -- EMLA cream for port -- no pain medication required at this time.   Orders Placed This Encounter  Procedures  . NM PET Image Restag (PS) Skull Base To Thigh    Standing Status:   Future    Standing Expiration Date:   06/07/2021    Order Specific Question:   If indicated for the ordered procedure, I authorize the administration of a radiopharmaceutical per Radiology protocol    Answer:   Yes    Order Specific Question:   Preferred imaging location?    Answer:   [facility]    All questions were answered. The patient knows to call the clinic with any problems, questions or concerns.  A total of more than 30 minutes were spent on this encounter and over half of that time was spent on counseling and coordination of care as outlined above.   Ledell Peoples, MD Department of Hematology/Oncology [place] at [facility] Phone: [phone] Pager: [phone] Email: [email or web address]  06/07/2020 4:13 PM   Literature Support:  Lynden Ang OA, Pro B, [place], [place], [place], [place], [place], Morschhauser F, Domingo-Domenech E, Margot Chimes [address], [place] , [place], [place], Leach SP, Shustov A, Httmann A, [place], Yuen S, Iyer S, Zinzani PL, Hua Z, [place] M, Romelle Starcher T, Trmper L; [place] Study Group. 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 Lancet. 2019 Jan 19;393(10168):229-240.   --Median progression-free survival was 482 months (95% CI 352-not evaluable) in the A+CHP group and 208 months  (127-476) in the CHOP group (hazard ratio 071 [95% CI 054-093], p=[id number]).   --Front-line treatment with A+CHP is superior to CHOP for patients with CD30-positive peripheral T-cell lymphomas as shown by a significant improvement in progression-free survival and overall survival with a manageable safety profile.

## 2020-06-07 NOTE — Progress Notes (Signed)
Provided one complimentary case of ensure plus. 

## 2020-06-09 ENCOUNTER — Other Ambulatory Visit: Payer: Self-pay

## 2020-06-09 ENCOUNTER — Inpatient Hospital Stay: Payer: Self-pay

## 2020-06-09 DIAGNOSIS — C8460 Anaplastic large cell lymphoma, ALK-positive, unspecified site: Secondary | ICD-10-CM

## 2020-06-09 MED ORDER — PEGFILGRASTIM-CBQV 6 MG/0.6ML ~~LOC~~ SOSY
6.0000 mg | PREFILLED_SYRINGE | Freq: Once | SUBCUTANEOUS | Status: AC
  Administered 2020-06-09: 6 mg via SUBCUTANEOUS

## 2020-06-09 NOTE — Patient Instructions (Signed)
Pegfilgrastim injection What is this medicine? PEGFILGRASTIM (PEG fil gra stim) is a long-acting granulocyte colony-stimulating factor that stimulates the growth of neutrophils, a type of white blood cell important in the body's fight against infection. It is used to reduce the incidence of fever and infection in patients with certain types of cancer who are receiving chemotherapy that affects the bone marrow, and to increase survival after being exposed to high doses of radiation. This medicine may be used for other purposes; ask your health care provider or pharmacist if you have questions. COMMON BRAND NAME(S): Fulphila, Neulasta, Nyvepria, UDENYCA, Ziextenzo What should I tell my health care provider before I take this medicine? They need to know if you have any of these conditions:  kidney disease  latex allergy  ongoing radiation therapy  sickle cell disease  skin reactions to acrylic adhesives (On-Body Injector only)  an unusual or allergic reaction to pegfilgrastim, filgrastim, other medicines, foods, dyes, or preservatives  pregnant or trying to get pregnant  breast-feeding How should I use this medicine? This medicine is for injection under the skin. If you get this medicine at home, you will be taught how to prepare and give the pre-filled syringe or how to use the On-body Injector. Refer to the patient Instructions for Use for detailed instructions. Use exactly as directed. Tell your healthcare provider immediately if you suspect that the On-body Injector may not have performed as intended or if you suspect the use of the On-body Injector resulted in a missed or partial dose. It is important that you put your used needles and syringes in a special sharps container. Do not put them in a trash can. If you do not have a sharps container, call your pharmacist or healthcare provider to get one. Talk to your pediatrician regarding the use of this medicine in children. While this drug  may be prescribed for selected conditions, precautions do apply. Overdosage: If you think you have taken too much of this medicine contact a poison control center or emergency room at once. NOTE: This medicine is only for you. Do not share this medicine with others. What if I miss a dose? It is important not to miss your dose. Call your doctor or health care professional if you miss your dose. If you miss a dose due to an On-body Injector failure or leakage, a new dose should be administered as soon as possible using a single prefilled syringe for manual use. What may interact with this medicine? Interactions have not been studied. This list may not describe all possible interactions. Give your health care provider a list of all the medicines, herbs, non-prescription drugs, or dietary supplements you use. Also tell them if you smoke, drink alcohol, or use illegal drugs. Some items may interact with your medicine. What should I watch for while using this medicine? Your condition will be monitored carefully while you are receiving this medicine. You may need blood work done while you are taking this medicine. Talk to your health care provider about your risk of cancer. You may be more at risk for certain types of cancer if you take this medicine. If you are going to need a MRI, CT scan, or other procedure, tell your doctor that you are using this medicine (On-Body Injector only). What side effects may I notice from receiving this medicine? Side effects that you should report to your doctor or health care professional as soon as possible:  allergic reactions (skin rash, itching or hives, swelling of   the face, lips, or tongue)  back pain  dizziness  fever  pain, redness, or irritation at site where injected  pinpoint red spots on the skin  red or dark-brown urine  shortness of breath or breathing problems  stomach or side pain, or pain at the shoulder  swelling  tiredness  trouble  passing urine or change in the amount of urine  unusual bruising or bleeding Side effects that usually do not require medical attention (report to your doctor or health care professional if they continue or are bothersome):  bone pain  muscle pain This list may not describe all possible side effects. Call your doctor for medical advice about side effects. You may report side effects to FDA at 1-800-FDA-1088. Where should I keep my medicine? Keep out of the reach of children. If you are using this medicine at home, you will be instructed on how to store it. Throw away any unused medicine after the expiration date on the label. NOTE: This sheet is a summary. It may not cover all possible information. If you have questions about this medicine, talk to your doctor, pharmacist, or health care provider.  2021 Elsevier/Gold Standard (2019-01-28 13:20:51)  

## 2020-06-29 ENCOUNTER — Other Ambulatory Visit: Payer: Self-pay

## 2020-06-29 ENCOUNTER — Ambulatory Visit (HOSPITAL_COMMUNITY)
Admission: RE | Admit: 2020-06-29 | Discharge: 2020-06-29 | Disposition: A | Discharge status: Home / Self Care | Payer: Self-pay | Source: Ambulatory Visit | Attending: Hematology and Oncology | Admitting: Hematology and Oncology

## 2020-06-29 DIAGNOSIS — C8460 Anaplastic large cell lymphoma, ALK-positive, unspecified site: Secondary | ICD-10-CM | POA: Insufficient documentation

## 2020-06-29 LAB — GLUCOSE, CAPILLARY: Glucose-Capillary: 98 mg/dL (ref 70–99)

## 2020-06-29 MED ORDER — FLUDEOXYGLUCOSE F - 18 (FDG) INJECTION: 7.2000 | Freq: Once | INTRAVENOUS | Status: DC | PRN

## 2020-07-06 ENCOUNTER — Other Ambulatory Visit: Payer: Self-pay

## 2020-07-06 ENCOUNTER — Ambulatory Visit: Payer: Self-pay | Admitting: Hematology and Oncology

## 2020-07-09 ENCOUNTER — Telehealth: Payer: Self-pay | Admitting: Hematology and Oncology

## 2020-07-09 NOTE — Telephone Encounter (Signed)
R/s appts per 6/17 sch msg. Called pt, no answer. Left msg for pt to call back to r/s

## 2020-07-12 NOTE — Progress Notes (Signed)
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 Previously seen in June 2021 and given course of doxycycline. Infectious workup including RPR, HIV, GC/chlamydia were negative.  2) 09/23/2019: presented to PCP with concern for bilaterally enlarged lymph nodes of the groin. Noted to have scant drainage 3) 10/12/2019: establish care with Dr. 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  5) 02/23/2020: Cycle 1 Day 1 of BV-CHP 6) 03/15/2020: Cycle 2 Day 1 of BV-CHP 7) 04/05/2020: Cycle 3 Day 1 of BV-CHP 8) 04/26/2020: Cycle 4 Day 1 of BV-CHP 9) 05/17/2020:  Cycle 5 Day 1 of BV-CHP 10) 06/07/2020: Cycle 6 Day 1 of BV-CHP 11) 06/29/2020: PET CT scan showed near complete resolution of left axillary metabolic adenopathy as well as marked reduction in the metabolic activity of the inguinal lymph nodes down to Deauville score 3.  Findings are consistent with a [place] complete metabolic response  Interval History:  Tanner Gallagher 55 y.o. male with medical history significant for Anaplastic T Cell Lymphoma who presents for a follow up visit. The patient's last visit was on 06/07/2020 with Dede Query. In the interim since the last visit the patient he has completed Cycle 6 (the last cycle of chemotherapy).  On exam today Tanner Gallagher notes he tolerated his last treatment well.  He reports that the lymph nodes in his groin have shrunk considerably back to normal size.  He reports that his energy levels have been good and his weight is steady at 140 pounds.  He notes he is also back to working full-time.  He is optimistic and has no additional questions concerns or complaints.  Otherwise he denies any fevers,  chills, sweats, vomiting or diarrhea. A full 10 point ROS is listed below.  MEDICAL HISTORY:  Past Medical History:  Diagnosis Date   Cancer [facility])    lymph nodes   Chronic lower back pain 15 years   Medical history non-contributory    Tooth pain    no abscess or drainage per pt 02-01-2020    SURGICAL HISTORY: Past Surgical History:  Procedure Laterality Date   INCISION AND DRAINAGE PERIRECTAL ABSCESS  06/16/2017   INCISION AND DRAINAGE PERIRECTAL ABSCESS N/A 06/16/2017   Procedure: IRRIGATION AND DEBRIDEMENT PERIRECTAL ABSCESS;  Surgeon: Coralie Keens, MD;  Location: [place];  Service: General;  Laterality: N/A;   INGUINAL HERNIA REPAIR Right 2000s   IR IMAGING GUIDED PORT INSERTION  02/15/2020   LYMPH NODE BIOPSY Right 02/02/2020   Procedure: EXCISIONAL INGUINAL LYMPH NODE BIOPSY;  Surgeon: Leighton Ruff, MD;  Location: [facility];  Service: General;  Laterality: Right;   PROCTOSCOPY  06/16/2017   Procedure: RIGID PROCTOSCOPY;  Surgeon: Coralie Keens, MD;  Location: [facility] OR;  Service: General;;    SOCIAL HISTORY: Social History   Socioeconomic History   Marital status: Married    Spouse name: Not on file   Number of children: Not on file   Years of education: Not on file   Highest education level: Not on file  Occupational History   Not on file  Tobacco Use   Smoking status: Former    Packs/day: 0.12    Years: 37.00    Pack years: 4.44  Types: Cigarettes    Quit date: 05/20/2017    Years since quitting: 3.1   Smokeless tobacco: Never  Vaping Use   Vaping Use: Never used  Substance and Sexual Activity   Alcohol use: Not Currently    Comment: rarew   Drug use: Yes    Types: Marijuana    Comment: 02-01-2020 "once a day"   Sexual activity: Yes  Other Topics Concern   Not on file  Social History Narrative   Not on file   Social Determinants of Health   Financial Resource Strain: Not on file  Food Insecurity: Not on file  Transportation  Needs: Not on file  Physical Activity: Not on file  Stress: Not on file  Social Connections: Not on file  Intimate Partner Violence: Not on file    FAMILY HISTORY: Family History  Problem Relation Age of Onset   Diabetes Neg Hx     ALLERGIES:  has No Known Allergies.  MEDICATIONS:  Current Outpatient Medications  Medication Sig Dispense Refill   acetaminophen (TYLENOL) 500 MG tablet Take 1,000 mg by mouth every 6 (six) hours as needed for mild pain, fever or headache.     allopurinol (ZYLOPRIM) 300 MG tablet TAKE 1 TABLET (300 MG TOTAL) BY MOUTH DAILY. 30 tablet 3   lidocaine-prilocaine (EMLA) cream APPLY 1 APPLICATION TOPICALLY AS NEEDED. (Patient taking differently: Apply 1 application topically as needed (port access/hemmoroids).) 30 g 1   ondansetron (ZOFRAN) 8 MG tablet TAKE 1 TABLET (8 MG TOTAL) BY MOUTH EVERY 8 (EIGHT) HOURS AS NEEDED FOR NAUSEA OR VOMITING. 20 tablet 2   pantoprazole (PROTONIX) 40 MG tablet TAKE 1 TABLET (40 MG TOTAL) BY MOUTH DAILY. 30 tablet 3   predniSONE (DELTASONE) 20 MG tablet TAKE 5 TABLETS (100 MG TOTAL) BY MOUTH DAILY WITH BREAKFAST. TAKE FOR 5 DAYS, STARTING ON DAY 1 OF YOUR CHEMOTHERAPY 100 tablet 3   predniSONE (DELTASONE) 20 MG tablet TAKE 5 TABLETS (100 MG TOTAL) BY MOUTH DAILY WITH BREAKFAST. TAKE FOR 5 DAYS, STARTING ON DAY 1 OF YOUR CHEMOTHERAPY 100 tablet 3   prochlorperazine (COMPAZINE) 10 MG tablet TAKE 1 TABLET (10 MG TOTAL) BY MOUTH EVERY 6 (SIX) HOURS AS NEEDED FOR NAUSEA OR VOMITING. 30 tablet 3   senna-docusate (SENOKOT-S) 8.6-50 MG tablet Take 1 tablet by mouth 2 (two) times daily between meals as needed for mild constipation. 60 tablet 0   No current facility-administered medications for this visit.    REVIEW OF SYSTEMS:   Constitutional: ( - ) fevers, ( - )  chills , ( - ) night sweats Eyes: ( - ) blurriness of vision, ( - ) double vision, ( - ) watery eyes Ears, nose, mouth, throat, and face: ( - ) mucositis, ( - ) sore  throat Respiratory: ( - ) cough, ( - ) dyspnea, ( - ) wheezes Cardiovascular: ( - ) palpitation, ( - ) chest discomfort, ( - ) lower extremity swelling Gastrointestinal:  ( - ) nausea, ( - ) heartburn, ( - ) change in bowel habits Skin: ( - ) abnormal skin rashes Lymphatics: ( - ) new lymphadenopathy, ( - ) easy bruising Neurological: ( - ) numbness, ( - ) tingling, ( - ) new weaknesses Behavioral/Psych: ( - ) mood change, ( - ) new changes  All other systems were reviewed with the patient and are negative.  PHYSICAL EXAMINATION: ECOG PERFORMANCE STATUS: 0 - Asymptomatic  Vitals:   07/13/20 1445  BP: 105/70  Pulse: 72  Resp: 18  Temp: 97.9 F (36.6 C)  SpO2: 100%    Filed Weights   07/13/20 1445  Weight: 139 lb 1.6 oz (63.1 kg)     GENERAL: well appearing middle aged [place] American male. alert, no distress and comfortable SKIN: skin color, texture, turgor are normal, no rashes or significant lesions EYES: conjunctiva are pink and non-injected, sclera clear LYMPH:  Deferred today LUNGS: clear to auscultation and percussion with normal breathing effort HEART: regular rate & rhythm and no murmurs and no lower extremity edema Musculoskeletal: no cyanosis of digits and no clubbing  PSYCH: alert & oriented x 3, fluent speech NEURO: no focal motor/sensory deficits  LABORATORY DATA:  I have reviewed the data as listed CBC Latest Ref Rng & Units 07/13/2020 06/07/2020 05/17/2020  WBC 4.0 - 10.5 K/uL 5.6 5.1 5.6  Hemoglobin 13.0 - 17.0 g/dL 11.3(L) 10.6(L) 10.5(L)  Hematocrit 39.0 - 52.0 % 33.2(L) 32.4(L) 31.7(L)  Platelets 150 - 400 K/uL 325 261 449(H)    CMP Latest Ref Rng & Units 07/13/2020 06/07/2020 05/17/2020  Glucose 70 - 99 mg/dL 87 114(H) 107(H)  BUN 6 - 20 mg/dL _0 Creatinine 0.61 - 1.24 mg/dL 0.82 0.74 0.78  Sodium 135 - 145 mmol/L 140 141 141  Potassium 3.5 - 5.1 mmol/L 4.1 4.0 4.2  Chloride 98 - 111 mmol/L 107 106 104  CO2 22 - 32 mmol/L _1 Calcium  8.9 - 10.3 mg/dL 9.6 9.3 9.2  Total Protein 6.5 - 8.1 g/dL 7.6 6.7 6.7  Total Bilirubin 0.3 - 1.2 mg/dL 0.3 0.3 <0.2(L)  Alkaline Phos 38 - 126 U/L 59 68 56  AST 15 - 41 U/L _2 ALT 0 - 44 U/L _3 RADIOGRAPHIC STUDIES: NM PET Image Restag (PS) Skull Base To Thigh  Result Date: 06/29/2020 CLINICAL DATA:  Subsequent treatment strategy for non-Hodgkin's lymphoma. 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 Full-ring PET imaging was performed from the skull base to thigh after the radiotracer. CT data was obtained and used for attenuation correction and anatomic localization. Fasting blood glucose: 98 mg/dl COMPARISON:  MRI 03/23/2018 com PET-CT 02/24/2020 FINDINGS: Mediastinal blood pool activity: SUV max 1.6 Liver activity: SUV max NA NECK: Port in the anterior chest wall with tip in distal SVC. Incidental CT findings: none CHEST: Near complete resolution of the hypermetabolic LEFT axillary lymph node. Lymph node measures normal size at 6 mm with background metabolic activity (SUV max equal 1.4). Previous lesion was intensely hypermetabolic SUV max equal 6.8. Activity now less than blood pool. No RIGHT axillary adenopathy identified. Small RIGHT middle lobe nodule measuring 2 mm is unchanged. No new pulmonary nodularity. Incidental CT findings: none ABDOMEN/PELVIS: Marked reduction in metabolic activity and size of bilateral inguinal lymph nodes. Previously 23 mm lymph node now measures 8 mm (image 27 and with low metabolic activity SUV max 1.4. Smaller medial node has mild metabolic activity SUV max equal 2.6. This node was not previously enlarged in remains normal size at 6 mm. Incidental CT findings: Mild activity in the small RIGHT inguinal lymph nodes also with SUV max equal 3.1. The remains intense metabolic activity associated with the perirectal fat which extends into the gluteal crease  consistent with perirectal perianal fistula. These described on recent MRI. New subcutaneous fistula at the skin surface LEFT of the gluteal crease on image 184  with SUV max equal 5.1. Lesion measures 13 mm SKELETON: No aggressive osseous lesion. Incidental CT findings: none IMPRESSION: 1. Near complete resolution of LEFT axillary metabolic adenopathy (Deauville 2 2. Marked reduction in metabolic activity of inguinal lymph nodes ( Deauville 3. Nodes are now normal size. 3. No evidence of new lymphadenopathy. 4. Persistent hypermetabolic perianal/perirectal tissue associated with perianal fistula described on recent MRI. Electronically Signed   By: Suzy Bouchard M.D.   On: 06/29/2020 16:48    ASSESSMENT & PLAN Tanner Gallagher 55 y.o. male with no significant medical history presents for evaluation of Anaplastic T Cell Lymphoma.  Review the labs, review the records, discussion with the patient the findings are most consistent with Anaplastic T Cell Lymphoma.     PET CT scan performed on 02/24/2020 confirmed Stage III disease with involvement of the axillary and inguinal lymph nodes. He has also had a port placed and echocardiogram performed. Post treatment PET CT scan on 06/29/2020 showed Deauville 3 inguinal lymph nodes, returned to normal size. Near complete resolution of axillary adenopathy.   Previously Tanner Gallagher has had the opportunity to ask any questions or concerns that he has had about his disease or chemotherapy moving forward.  He notes that he is aware of the risks and benefits of treatment and is willing and able to proceed with treatment today.  The treatment of choice for this patient's cancer would be BV-CHP chemotherapy.  This regimen consists of doxorubicin 50 mg per metered squared on day 1, cyclophosphamide 750 mg per metered squared on day 1, and brentuximab 1.8 mg/kg on day 1 of a 21-day cycle.  The patient also received prednisone 15m PO on days 1-5.  We will proceed for 6 cycles.   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  --port had been placed, TTE shows adequate heart function for anthracycline therapy.  --initial PET CT scan shows involvement of the inguinal/axillary lymph nodes. Findings consistent with Stage III disease.  --patient completed 6 cycles of  BV-CHP chemotherapy as noted above. Cycle 1 Day 1 of treatment started on 02/23/2020.  --post treatment PET CT scan on 06/29/2020 showed Deauville 3 inguinal lymph nodes, returned to normal size. Near complete resolution of axillary adenopathy.  --Per NCCN recommendations, will follow the patient q 3 months with labs and q 6 months for CT C/A/P x 2 years.  --RTC in 3 months for continue monitoring   #Supportive Care --zofran [id number] PRN and compazine 1071mO q6H for nausea -- allopurinol 300m20m daily for TLS prophylaxis -- EMLA cream for port -- no pain medication required at this time.   No orders of the defined types were placed in this encounter.   All questions were answered. The patient knows to call the clinic with any problems, questions or concerns.  A total of more than 30 minutes were spent on this encounter and over half of that time was spent on counseling and coordination of care as outlined above.   JohnLedell Peoples Department of Hematology/Oncology [facility]: [phone]: [phone]: [email or web address]  07/22/2020 7:55 PM   Literature Support:  HorwLynden Ang Pro B, IlliClintonnaDerby CentervaHopertEverettriHidden Valley Lakerschhauser F, Domingo-Domenech E, RossMargot Chimes [address] , TobiKremmlingukasaki K, YRolly Pancake ShusCabo Rojo  Httmann A, Savage KJ, Knox Royalty S, Zinzani PL, [place], [place] M, [place], [place], Manley T, Trmper L; [place] Study Group. Brentuximab vedotin with chemotherapy for  CD30-positive peripheral T-cell lymphoma (ECHELON-2): a global, double-blind, randomised, phase 3 trial. Lancet. 2019 Jan 19;393(10168):229-240.   --Median progression-free survival was 482 months (95% CI 352-not evaluable) in the A+CHP group and 208 months (127-476) in the CHOP group (hazard ratio 071 [95% CI 054-093], p=[id number]).   --Front-line treatment with A+CHP is superior to CHOP for patients with CD30-positive peripheral T-cell lymphomas as shown by a significant improvement in progression-free survival and overall survival with a manageable safety profile.

## 2020-07-13 ENCOUNTER — Inpatient Hospital Stay: Payer: Self-pay | Attending: Hematology and Oncology | Admitting: Hematology and Oncology

## 2020-07-13 ENCOUNTER — Other Ambulatory Visit: Payer: Self-pay

## 2020-07-13 ENCOUNTER — Inpatient Hospital Stay: Payer: Self-pay

## 2020-07-13 VITALS — BP 105/70 | HR 72 | Temp 97.9°F | Resp 18 | Ht 68.0 in | Wt 139.1 lb

## 2020-07-13 DIAGNOSIS — Z95828 Presence of other vascular implants and grafts: Secondary | ICD-10-CM

## 2020-07-13 DIAGNOSIS — C8442 Peripheral T-cell lymphoma, not classified, intrathoracic lymph nodes: Secondary | ICD-10-CM | POA: Insufficient documentation

## 2020-07-13 DIAGNOSIS — C8460 Anaplastic large cell lymphoma, ALK-positive, unspecified site: Secondary | ICD-10-CM

## 2020-07-13 LAB — CMP (CANCER CENTER ONLY)
ALT: 16 U/L (ref 0–44)
AST: 26 U/L (ref 15–41)
Albumin: 3.5 g/dL (ref 3.5–5.0)
Alkaline Phosphatase: 59 U/L (ref 38–126)
Anion gap: 8 (ref 5–15)
BUN: 19 mg/dL (ref 6–20)
CO2: 25 mmol/L (ref 22–32)
Calcium: 9.6 mg/dL (ref 8.9–10.3)
Chloride: 107 mmol/L (ref 98–111)
Creatinine: 0.82 mg/dL (ref 0.61–1.24)
GFR, Estimated: >60 mL/min (ref >60)
Glucose, Bld: 87 mg/dL (ref 70–99)
Potassium: 4.1 mmol/L (ref 3.5–5.1)
Sodium: 140 mmol/L (ref 135–145)
Total Bilirubin: 0.3 mg/dL (ref 0.3–1.2)
Total Protein: 7.6 g/dL (ref 6.5–8.1)

## 2020-07-13 LAB — LACTATE DEHYDROGENASE: LDH: 239 U/L — ABNORMAL HIGH (ref 98–192)

## 2020-07-13 LAB — URIC ACID: Uric Acid, Serum: 5 mg/dL (ref 3.7–8.6)

## 2020-07-13 LAB — CBC WITH DIFFERENTIAL (CANCER CENTER ONLY)
Abs Immature Granulocytes: 0.01 10*3/uL (ref 0.00–0.07)
Basophils Absolute: 0.1 10*3/uL (ref 0.0–0.1)
Basophils Relative: 1 %
Eosinophils Absolute: 0.6 10*3/uL — ABNORMAL HIGH (ref 0.0–0.5)
Eosinophils Relative: 10 %
HCT: 33.2 % — ABNORMAL LOW (ref 39.0–52.0)
Hemoglobin: 11.3 g/dL — ABNORMAL LOW (ref 13.0–17.0)
Immature Granulocytes: 0 %
Lymphocytes Relative: 44 %
Lymphs Abs: 2.4 10*3/uL (ref 0.7–4.0)
MCH: 31.7 pg (ref 26.0–34.0)
MCHC: 34 g/dL (ref 30.0–36.0)
MCV: 93 fL (ref 80.0–100.0)
Monocytes Absolute: 0.7 10*3/uL (ref 0.1–1.0)
Monocytes Relative: 13 %
Neutro Abs: 1.8 10*3/uL (ref 1.7–7.7)
Neutrophils Relative %: 32 %
Platelet Count: 325 10*3/uL (ref 150–400)
RBC: 3.57 MIL/uL — ABNORMAL LOW (ref 4.22–5.81)
RDW: 15.8 % — ABNORMAL HIGH (ref 11.5–15.5)
WBC Count: 5.6 10*3/uL (ref 4.0–10.5)
nRBC: 0 % (ref 0.0–0.2)

## 2020-07-13 MED ORDER — SODIUM CHLORIDE 0.9% FLUSH
10.0000 mL | Freq: Once | INTRAVENOUS | Status: AC
  Administered 2020-07-13: 10 mL
  Filled 2020-07-13: qty 10

## 2020-07-13 MED ORDER — HEPARIN SOD (PORK) LOCK FLUSH 100 UNIT/ML IV SOLN
500.0000 [IU] | Freq: Once | INTRAVENOUS | Status: AC
  Administered 2020-07-13: 500 [IU]
  Filled 2020-07-13: qty 5

## 2020-07-13 NOTE — Patient Instructions (Signed)
Implanted Port Home Guide An implanted port is a device that is placed under the skin. It is usually placed in the chest. The device can be used to give IV medicine, to take blood, or for dialysis. You may have an implanted port if: You need IV medicine that would be irritating to the small veins in your hands or arms. You need IV medicines, such as antibiotics, for a long period of time. You need IV nutrition for a long period of time. You need dialysis. When you have a port, your health care provider can choose to use the port instead of veins in your arms for these procedures. You may have fewer limitations when using a port than you would if you used other types of long-term IVs, and you will likely be able to return to normal activities afteryour incision heals. An implanted port has two main parts: Reservoir. The reservoir is the part where a needle is inserted to give medicines or draw blood. The reservoir is round. After it is placed, it appears as a small, raised area under your skin. Catheter. The catheter is a thin, flexible tube that connects the reservoir to a vein. Medicine that is inserted into the reservoir goes into the catheter and then into the vein. How is my port accessed? To access your port: A numbing cream may be placed on the skin over the port site. Your health care provider will put on a mask and sterile gloves. The skin over your port will be cleaned carefully with a germ-killing soap and allowed to dry. Your health care provider will gently pinch the port and insert a needle into it. Your health care provider will check for a blood return to make sure the port is in the vein and is not clogged. If your port needs to remain accessed to get medicine continuously (constant infusion), your health care provider will place a clear bandage (dressing) over the needle site. The dressing and needle will need to be changed every week, or as told by your health care provider. What  is flushing? Flushing helps keep the port from getting clogged. Follow instructions from your health care provider about how and when to flush the port. Ports are usually flushed with saline solution or a medicine called heparin. The need for flushing will depend on how the port is used: If the port is only used from time to time to give medicines or draw blood, the port may need to be flushed: Before and after medicines have been given. Before and after blood has been drawn. As part of routine maintenance. Flushing may be recommended every 4-6 weeks. If a constant infusion is running, the port may not need to be flushed. Throw away any syringes in a disposal container that is meant for sharp items (sharps container). You can buy a sharps container from a pharmacy, or you can make one by using an empty hard plastic bottle with a cover. How long will my port stay implanted? The port can stay in for as long as your health care provider thinks it is needed. When it is time for the port to come out, a surgery will be done to remove it. The surgery will be similar to the procedure that was done to putthe port in. Follow these instructions at home:  Flush your port as told by your health care provider. If you need an infusion over several days, follow instructions from your health care provider about how to take   care of your port site. Make sure you: Wash your hands with soap and water before you change your dressing. If soap and water are not available, use alcohol-based hand sanitizer. Change your dressing as told by your health care provider. Place any used dressings or infusion bags into a plastic bag. Throw that bag in the trash. Keep the dressing that covers the needle clean and dry. Do not get it wet. Do not use scissors or sharp objects near the tube. Keep the tube clamped, unless it is being used. Check your port site every day for signs of infection. Check for: Redness, swelling, or  pain. Fluid or blood. Pus or a bad smell. Protect the skin around the port site. Avoid wearing bra straps that rub or irritate the site. Protect the skin around your port from seat belts. Place a soft pad over your chest if needed. Bathe or shower as told by your health care provider. The site may get wet as long as you are not actively receiving an infusion. Return to your normal activities as told by your health care provider. Ask your health care provider what activities are safe for you. Carry a medical alert card or wear a medical alert bracelet at all times. This will let health care providers know that you have an implanted port in case of an emergency. Get help right away if: You have redness, swelling, or pain at the port site. You have fluid or blood coming from your port site. You have pus or a bad smell coming from the port site. You have a fever. Summary Implanted ports are usually placed in the chest for long-term IV access. Follow instructions from your health care provider about flushing the port and changing bandages (dressings). Take care of the area around your port by avoiding clothing that puts pressure on the area, and by watching for signs of infection. Protect the skin around your port from seat belts. Place a soft pad over your chest if needed. Get help right away if you have a fever or you have redness, swelling, pain, drainage, or a bad smell at the port site. This information is not intended to replace advice given to you by your health care provider. Make sure you discuss any questions you have with your healthcare provider. Document Revised: 05/23/2019 Document Reviewed: 05/23/2019 Elsevier Patient Education  2022 Elsevier Inc.  

## 2020-07-22 ENCOUNTER — Encounter: Payer: Self-pay | Admitting: Hematology and Oncology

## 2020-07-24 ENCOUNTER — Telehealth: Payer: Self-pay | Admitting: Hematology and Oncology

## 2020-07-24 NOTE — Telephone Encounter (Signed)
Scheduled appts per 7/3 sch msg. Called pt, no answer. Left msg with appts date and times.

## 2020-08-20 ENCOUNTER — Other Ambulatory Visit: Payer: Self-pay | Admitting: Hematology and Oncology

## 2020-08-20 ENCOUNTER — Telehealth: Payer: Self-pay | Admitting: *Deleted

## 2020-08-20 DIAGNOSIS — Z95828 Presence of other vascular implants and grafts: Secondary | ICD-10-CM

## 2020-08-20 NOTE — Telephone Encounter (Signed)
Received call from patient requesting that he get scheduled to have his port removed. He states that Dr. Lorenso Courier told at his last visit that it could be removed.  Please advise

## 2020-09-13 ENCOUNTER — Other Ambulatory Visit: Payer: Self-pay | Admitting: Student

## 2020-09-14 ENCOUNTER — Ambulatory Visit (HOSPITAL_COMMUNITY)
Admission: RE | Admit: 2020-09-14 | Discharge: 2020-09-14 | Disposition: A | Discharge status: Home / Self Care | Payer: Self-pay | Source: Ambulatory Visit | Attending: Hematology and Oncology | Admitting: Hematology and Oncology

## 2020-09-14 ENCOUNTER — Other Ambulatory Visit: Payer: Self-pay

## 2020-09-14 ENCOUNTER — Encounter (HOSPITAL_COMMUNITY): Payer: Self-pay

## 2020-09-14 DIAGNOSIS — Z87891 Personal history of nicotine dependence: Secondary | ICD-10-CM | POA: Insufficient documentation

## 2020-09-14 DIAGNOSIS — Z8572 Personal history of non-Hodgkin lymphomas: Secondary | ICD-10-CM | POA: Insufficient documentation

## 2020-09-14 DIAGNOSIS — Z452 Encounter for adjustment and management of vascular access device: Secondary | ICD-10-CM | POA: Insufficient documentation

## 2020-09-14 DIAGNOSIS — Z95828 Presence of other vascular implants and grafts: Secondary | ICD-10-CM

## 2020-09-14 DIAGNOSIS — Z79899 Other long term (current) drug therapy: Secondary | ICD-10-CM | POA: Insufficient documentation

## 2020-09-14 HISTORY — PX: IR REMOVAL TUN ACCESS W/ PORT W/O FL MOD SED: IMG2290

## 2020-09-14 MED ORDER — MIDAZOLAM HCL 2 MG/2ML IJ SOLN
INTRAMUSCULAR | Status: AC | PRN
  Administered 2020-09-14: 2 mg via INTRAVENOUS

## 2020-09-14 MED ORDER — LIDOCAINE-EPINEPHRINE 1 %-1:100000 IJ SOLN
INTRAMUSCULAR | Status: AC
  Filled 2020-09-14: qty 1

## 2020-09-14 MED ORDER — FENTANYL CITRATE (PF) 100 MCG/2ML IJ SOLN
INTRAMUSCULAR | Status: AC
  Filled 2020-09-14: qty 2

## 2020-09-14 MED ORDER — SODIUM CHLORIDE 0.9 % IV SOLN: INTRAVENOUS | Status: DC

## 2020-09-14 MED ORDER — MIDAZOLAM HCL 2 MG/2ML IJ SOLN
INTRAMUSCULAR | Status: AC
  Filled 2020-09-14: qty 4

## 2020-09-14 MED ORDER — FENTANYL CITRATE (PF) 100 MCG/2ML IJ SOLN
INTRAMUSCULAR | Status: AC | PRN
  Administered 2020-09-14 (×2): 50 ug via INTRAVENOUS

## 2020-09-14 MED ORDER — LIDOCAINE-EPINEPHRINE 1 %-1:100000 IJ SOLN
INTRAMUSCULAR | Status: AC | PRN
  Administered 2020-09-14: 10 mL via INTRADERMAL

## 2020-09-14 NOTE — H&P (Signed)
Chief Complaint: Completion of chemotherapy. Request is for portacath removal   Referring Physician(s): Dorsey,John T IV  Supervising Physician: Jacqulynn Cadet  Patient Status: [facility] - Out-pt  History of Present Illness: Tanner Gallagher is a 55 y.o. male outpatient., History of T / null cell lymphoma.  IR placed a RIJ single lumen portacath on 1.26.22. The patient has since completed treatment. Team is requesting a portacath removal. No longer needed. Currently without any significant complaints. Patient alert and laying in bed, calm and comfortable. Denies any fevers, headache, chest pain, SOB, cough, abdominal pain, nausea, vomiting or bleeding.  Return precautions and treatment recommendations and follow-up discussed with the patient who is agreeable with the plan.    Past Medical History:  Diagnosis Date   Cancer [facility])    lymph nodes   Chronic lower back pain 15 years   Medical history non-contributory    Tooth pain    no abscess or drainage per pt 02-01-2020    Past Surgical History:  Procedure Laterality Date   INCISION AND DRAINAGE PERIRECTAL ABSCESS  06/16/2017   INCISION AND DRAINAGE PERIRECTAL ABSCESS N/A 06/16/2017   Procedure: IRRIGATION AND DEBRIDEMENT PERIRECTAL ABSCESS;  Surgeon: Coralie Keens, MD;  Location: [place];  Service: General;  Laterality: N/A;   INGUINAL HERNIA REPAIR Right 2000s   IR IMAGING GUIDED PORT INSERTION  02/15/2020   LYMPH NODE BIOPSY Right 02/02/2020   Procedure: EXCISIONAL INGUINAL LYMPH NODE BIOPSY;  Surgeon: Leighton Ruff, MD;  Location: [facility];  Service: General;  Laterality: Right;   PROCTOSCOPY  06/16/2017   Procedure: RIGID PROCTOSCOPY;  Surgeon: Coralie Keens, MD;  Location: [place];  Service: General;;    Allergies: Patient has no known allergies.  Medications: Prior to Admission medications   Medication Sig Start Date End Date Taking? Authorizing Provider  acetaminophen (TYLENOL) 500 MG tablet Take  1,000 mg by mouth every 6 (six) hours as needed for mild pain, fever or headache.    [provider]  allopurinol (ZYLOPRIM) 300 MG tablet TAKE 1 TABLET (300 MG TOTAL) BY MOUTH DAILY. 04/05/20 04/05/21  Ledell Peoples IV, MD  lidocaine-prilocaine (EMLA) cream APPLY 1 APPLICATION TOPICALLY AS NEEDED. Patient taking differently: Apply 1 application topically as needed (port access/hemmoroids). 02/17/20 02/16/21  Ledell Peoples IV, MD  ondansetron (ZOFRAN) 8 MG tablet TAKE 1 TABLET (8 MG TOTAL) BY MOUTH EVERY 8 (EIGHT) HOURS AS NEEDED FOR NAUSEA OR VOMITING. 02/17/20 02/16/21  Orson Slick, MD  pantoprazole (PROTONIX) 40 MG tablet TAKE 1 TABLET (40 MG TOTAL) BY MOUTH DAILY. 03/01/20 03/01/21  Orson Slick, MD  predniSONE (DELTASONE) 20 MG tablet TAKE 5 TABLETS (100 MG TOTAL) BY MOUTH DAILY WITH BREAKFAST. TAKE FOR 5 DAYS, STARTING ON DAY 1 OF YOUR CHEMOTHERAPY 03/15/20 03/15/21  Orson Slick, MD  predniSONE (DELTASONE) 20 MG tablet TAKE 5 TABLETS (100 MG TOTAL) BY MOUTH DAILY WITH BREAKFAST. TAKE FOR 5 DAYS, STARTING ON DAY 1 OF YOUR CHEMOTHERAPY 03/15/20 03/15/21  Orson Slick, MD  prochlorperazine (COMPAZINE) 10 MG tablet TAKE 1 TABLET (10 MG TOTAL) BY MOUTH EVERY 6 (SIX) HOURS AS NEEDED FOR NAUSEA OR VOMITING. 02/17/20 02/16/21  Orson Slick, MD  senna-docusate (SENOKOT-S) 8.6-50 MG tablet Take 1 tablet by mouth 2 (two) times daily between meals as needed for mild constipation. 04/20/20   Mercy Riding, MD     Family History  Problem Relation Age of Onset   Diabetes Neg Hx  Social History   Socioeconomic History   Marital status: Married    Spouse name: Not on file   Number of children: Not on file   Years of education: Not on file   Highest education level: Not on file  Occupational History   Not on file  Tobacco Use   Smoking status: Former    Packs/day: 0.12    Years: 37.00    Pack years: 4.44    Types: Cigarettes    Quit date: 05/20/2017    Years since  quitting: 3.3   Smokeless tobacco: Never  Vaping Use   Vaping Use: Never used  Substance and Sexual Activity   Alcohol use: Not Currently    Comment: rarew   Drug use: Yes    Types: Marijuana    Comment: 02-01-2020 "once a day"   Sexual activity: Yes  Other Topics Concern   Not on file  Social History Narrative   Not on file   Social Determinants of Health   Financial Resource Strain: Not on file  Food Insecurity: Not on file  Transportation Needs: Not on file  Physical Activity: Not on file  Stress: Not on file  Social Connections: Not on file    Review of Systems: A 12 point ROS discussed and pertinent positives are indicated in the HPI above.  All other systems are negative.  Review of Systems  Constitutional:  Negative for fever.  HENT:  Negative for congestion.   Respiratory:  Negative for cough and shortness of breath.   Cardiovascular:  Negative for chest pain.  Gastrointestinal:  Negative for abdominal pain.  Neurological:  Negative for headaches.  Psychiatric/Behavioral:  Negative for behavioral problems and confusion.    Vital Signs: There were no vitals taken for this visit.  Physical Exam Vitals and nursing note reviewed.  Constitutional:      Appearance: He is well-developed.  HENT:     Head: Normocephalic.  Cardiovascular:     Rate and Rhythm: Normal rate and regular rhythm.     Heart sounds: Normal heart sounds.  Pulmonary:     Effort: Pulmonary effort is normal.  Musculoskeletal:        General: Normal range of motion.     Cervical back: Normal range of motion.  Skin:    General: Skin is dry.  Neurological:     Mental Status: He is alert and oriented to person, place, and time.    Imaging: No results found.  Labs:  CBC: Recent Labs    04/26/20 1212 05/17/20 1039 06/07/20 1021 07/13/20 1425  WBC 7.1 5.6 5.1 5.6  HGB 11.6* 10.5* 10.6* 11.3*  HCT 35.0* 31.7* 32.4* 33.2*  PLT 381 449* 261 325    COAGS: No results for input(s):  INR, APTT in the last 8760 hours.  BMP: Recent Labs    10/12/19 1432 12/09/19 1453 04/26/20 1212 05/17/20 1039 06/07/20 1021 07/13/20 1425  NA 137   < > 139 141 141 140  K 4.0   < > 4.5 4.2 4.0 4.1  CL 106   < > 105 104 106 107  CO2 28   < > '25 30 29 25  '$ GLUCOSE 75   < > 117* 107* 114* 87  BUN 14   < > '20 15 15 19  '$ CALCIUM 9.3   < > 9.1 9.2 9.3 9.6  CREATININE 0.95   < > 0.89 0.78 0.74 0.82  GFRNONAA >60   < > >60 >60 >60 >60  GFRAA >  60  --   --   --   --   --    < > = values in this interval not displayed.    LIVER FUNCTION TESTS: Recent Labs    04/26/20 1212 05/17/20 1039 06/07/20 1021 07/13/20 1425  BILITOT <0.2* <0.2* 0.3 0.3  AST '21 22 22 26  '$ ALT '22 15 24 16  '$ ALKPHOS 54 56 68 59  PROT 7.4 6.7 6.7 7.6  ALBUMIN 3.6 3.1* 3.3* 3.5      Assessment and Plan:  55 y.o. male outpatient. History of  T/ null cell lymphoma.  IR placed a RIJ single lumen portacath on 1.26.22. The patient has since completed treatment. Team is requesting a portacath removal. No longer needed.   Most recent imagine NM scan performed on 6.10.22 shows the portacath in good position. No recent labs. All medications are within acceptable parameters. NKDA. Patient has been NPO since midnight.   Risks and benefits of image guided port-a-catheter removal was discussed with the patient including, but not limited to bleeding, infection, pneumothorax, or fibrin sheath development and need for additional procedures.  All of the patient's questions were answered, patient is agreeable to proceed. Consent signed and in chart.   Thank you for this interesting consult.  I greatly enjoyed meeting Wilborn Deeter and look forward to participating in their care.  A copy of this report was sent to the requesting provider on this date.  Electronically Signed: Jacqualine Mau, NP 09/14/2020, 11:43 AM   I spent a total of  30 Minutes   in face to face in clinical consultation, greater than 50% of which was  counseling/coordinating care for portacath removal

## 2020-09-14 NOTE — Procedures (Signed)
Interventional Radiology Procedure Note  Procedure: Right chest port removal  Complications: None  Estimated Blood Loss: None  Recommendations: - DC home   Signed,  Criselda Peaches, MD

## 2020-10-24 ENCOUNTER — Inpatient Hospital Stay: Payer: Self-pay

## 2020-10-24 ENCOUNTER — Inpatient Hospital Stay: Payer: Self-pay | Admitting: Hematology and Oncology

## 2020-10-25 ENCOUNTER — Inpatient Hospital Stay (HOSPITAL_BASED_OUTPATIENT_CLINIC_OR_DEPARTMENT_OTHER): Payer: Self-pay | Admitting: Hematology and Oncology

## 2020-10-25 ENCOUNTER — Other Ambulatory Visit: Payer: Self-pay

## 2020-10-25 ENCOUNTER — Inpatient Hospital Stay: Payer: Self-pay | Attending: Hematology and Oncology

## 2020-10-25 VITALS — BP 111/73 | HR 76 | Temp 97.0°F | Resp 18 | Wt 138.2 lb

## 2020-10-25 DIAGNOSIS — T451X5A Adverse effect of antineoplastic and immunosuppressive drugs, initial encounter: Secondary | ICD-10-CM | POA: Insufficient documentation

## 2020-10-25 DIAGNOSIS — C8442 Peripheral T-cell lymphoma, not classified, intrathoracic lymph nodes: Secondary | ICD-10-CM

## 2020-10-25 DIAGNOSIS — Z79899 Other long term (current) drug therapy: Secondary | ICD-10-CM | POA: Insufficient documentation

## 2020-10-25 DIAGNOSIS — C8440 Peripheral T-cell lymphoma, not classified, unspecified site: Secondary | ICD-10-CM | POA: Insufficient documentation

## 2020-10-25 DIAGNOSIS — Z7952 Long term (current) use of systemic steroids: Secondary | ICD-10-CM | POA: Insufficient documentation

## 2020-10-25 DIAGNOSIS — C8460 Anaplastic large cell lymphoma, ALK-positive, unspecified site: Secondary | ICD-10-CM

## 2020-10-25 DIAGNOSIS — D6481 Anemia due to antineoplastic chemotherapy: Secondary | ICD-10-CM | POA: Insufficient documentation

## 2020-10-25 DIAGNOSIS — R590 Localized enlarged lymph nodes: Secondary | ICD-10-CM

## 2020-10-25 LAB — CBC WITH DIFFERENTIAL (CANCER CENTER ONLY)
Abs Immature Granulocytes: 0 10*3/uL (ref 0.00–0.07)
Basophils Absolute: 0.1 10*3/uL (ref 0.0–0.1)
Basophils Relative: 1 %
Eosinophils Absolute: 0.4 10*3/uL (ref 0.0–0.5)
Eosinophils Relative: 7 %
HCT: 34.8 % — ABNORMAL LOW (ref 39.0–52.0)
Hemoglobin: 11.9 g/dL — ABNORMAL LOW (ref 13.0–17.0)
Immature Granulocytes: 0 %
Lymphocytes Relative: 56 %
Lymphs Abs: 3.1 10*3/uL (ref 0.7–4.0)
MCH: 30.3 pg (ref 26.0–34.0)
MCHC: 34.2 g/dL (ref 30.0–36.0)
MCV: 88.5 fL (ref 80.0–100.0)
Monocytes Absolute: 0.5 10*3/uL (ref 0.1–1.0)
Monocytes Relative: 9 %
Neutro Abs: 1.5 10*3/uL — ABNORMAL LOW (ref 1.7–7.7)
Neutrophils Relative %: 27 %
Platelet Count: 222 10*3/uL (ref 150–400)
RBC: 3.93 MIL/uL — ABNORMAL LOW (ref 4.22–5.81)
RDW: 15.1 % (ref 11.5–15.5)
WBC Count: 5.5 10*3/uL (ref 4.0–10.5)
nRBC: 0 % (ref 0.0–0.2)

## 2020-10-25 LAB — CMP (CANCER CENTER ONLY)
ALT: 13 U/L (ref 0–44)
AST: 18 U/L (ref 15–41)
Albumin: 3.7 g/dL (ref 3.5–5.0)
Alkaline Phosphatase: 66 U/L (ref 38–126)
Anion gap: 8 (ref 5–15)
BUN: 18 mg/dL (ref 6–20)
CO2: 26 mmol/L (ref 22–32)
Calcium: 9.3 mg/dL (ref 8.9–10.3)
Chloride: 105 mmol/L (ref 98–111)
Creatinine: 1.03 mg/dL (ref 0.61–1.24)
GFR, Estimated: >60 mL/min (ref >60)
Glucose, Bld: 122 mg/dL — ABNORMAL HIGH (ref 70–99)
Potassium: 4.2 mmol/L (ref 3.5–5.1)
Sodium: 139 mmol/L (ref 135–145)
Total Bilirubin: 0.4 mg/dL (ref 0.3–1.2)
Total Protein: 7.3 g/dL (ref 6.5–8.1)

## 2020-10-25 LAB — LACTATE DEHYDROGENASE: LDH: 171 U/L (ref 98–192)

## 2020-10-25 NOTE — Progress Notes (Signed)
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 Previously seen in June 2021 and given course of doxycycline. Infectious workup including RPR, HIV, GC/chlamydia were negative.  2) 09/23/2019: presented to PCP with concern for bilaterally enlarged lymph nodes of the groin. Noted to have scant drainage 3) 10/12/2019: establish care with Dr. 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  5) 02/23/2020: Cycle 1 Day 1 of BV-CHP 6) 03/15/2020: Cycle 2 Day 1 of BV-CHP 7) 04/05/2020: Cycle 3 Day 1 of BV-CHP 8) 04/26/2020: Cycle 4 Day 1 of BV-CHP 9) 05/17/2020:  Cycle 5 Day 1 of BV-CHP 10) 06/07/2020: Cycle 6 Day 1 of BV-CHP 11) 06/29/2020: PET CT scan showed near complete resolution of left axillary metabolic adenopathy as well as marked reduction in the metabolic activity of the inguinal lymph nodes down to Deauville score 3.  Findings are consistent with a [place] complete metabolic response  Interval History:  Tanner Gallagher 55 y.o. male with medical history significant for Anaplastic T Cell Lymphoma who presents for a follow up visit. The patient's last visit was on 07/13/2020. In the interim since the last visit the patient has had no major changes in his health.   On exam today Tanner Gallagher notes he has been well in the interim since our last visit.  His port has been removed he has had no major changes in health.  His appetite is good.  He notes that he continues to have some drainage from his bottom but currently does not have the money necessary for the co-pay to for this to be managed with surgery.  He notes that the lymph nodes in his groin and no longer palpable and have decreased  down to normal size.  He reports he is feeling good, exercising, and has no questions concerns or complaints.  Otherwise he denies any fevers, chills, sweats, vomiting or diarrhea. A full 10 point ROS is listed below.  MEDICAL HISTORY:  Past Medical History:  Diagnosis Date   Cancer (HCC)    lymph nodes   Chronic lower back pain 15 years   Medical history non-contributory    Tooth pain    no abscess or drainage per pt 02-01-2020    SURGICAL HISTORY: Past Surgical History:  Procedure Laterality Date   INCISION AND DRAINAGE PERIRECTAL ABSCESS  06/16/2017   INCISION AND DRAINAGE PERIRECTAL ABSCESS N/A 06/16/2017   Procedure: IRRIGATION AND DEBRIDEMENT PERIRECTAL ABSCESS;  Surgeon: Blackman, Douglas, MD;  Location[facility];  Service: General;  Laterality: N/A;   INGUINAL HERNIA REPAIR Right 2000s   IR IMAGING GUIDED PORT INSERTION  02/15/2020   IR REMOVAL TUN ACCESS W/ PORT W/O FL MOD SED  09/14/2020   LYMPH NODE BIOPSY Right 02/02/2020   Procedure: EXCISIONAL INGUINAL LYMPH NODE BIOPSY;  Surgeon: Thomas, Alicia, MD;  Location: [facility];  Service: General;  Laterality: Right;   PROCTOSCOPY  06/16/2017   Procedure: RIGID PROCTOSCOPY;  Surgeon: Blackman, Douglas, MD;  Location: [facility];  Service: General;;    SOCIAL HISTORY: Social History   Socioeconomic History   Marital status: Married    Spouse name: Not on file   Number of children: Not on file   Years of   education: Not on file   Highest education level: Not on file  Occupational History   Not on file  Tobacco Use   Smoking status: Former    Packs/day: 0.12    Years: 37.00    Pack years: 4.44    Types: Cigarettes    Quit date: 05/20/2017    Years since quitting: 3.4   Smokeless tobacco: Never  Vaping Use   Vaping Use: Never used  Substance and Sexual Activity   Alcohol use: Not Currently    Comment: rare   Drug use: Yes    Types: Marijuana    Comment: 02-01-2020 "once a day"; used in last 24 hours    Sexual activity: Yes  Other Topics Concern   Not on file  Social History Narrative   Not on file   Social Determinants of Health   Financial Resource Strain: Not on file  Food Insecurity: Not on file  Transportation Needs: Not on file  Physical Activity: Not on file  Stress: Not on file  Social Connections: Not on file  Intimate Partner Violence: Not on file    FAMILY HISTORY: Family History  Problem Relation Age of Onset   Diabetes Neg Hx     ALLERGIES:  has No Known Allergies.  MEDICATIONS:  Current Outpatient Medications  Medication Sig Dispense Refill   acetaminophen (TYLENOL) 500 MG tablet Take 1,000 mg by mouth every 6 (six) hours as needed for mild pain, fever or headache.     allopurinol (ZYLOPRIM) 300 MG tablet TAKE 1 TABLET (300 MG TOTAL) BY MOUTH DAILY. 30 tablet 3   lidocaine-prilocaine (EMLA) cream APPLY 1 APPLICATION TOPICALLY AS NEEDED. (Patient taking differently: Apply 1 application topically as needed (port access/hemmoroids).) 30 g 1   ondansetron (ZOFRAN) 8 MG tablet TAKE 1 TABLET (8 MG TOTAL) BY MOUTH EVERY 8 (EIGHT) HOURS AS NEEDED FOR NAUSEA OR VOMITING. 20 tablet 2   pantoprazole (PROTONIX) 40 MG tablet TAKE 1 TABLET (40 MG TOTAL) BY MOUTH DAILY. 30 tablet 3   predniSONE (DELTASONE) 20 MG tablet TAKE 5 TABLETS (100 MG TOTAL) BY MOUTH DAILY WITH BREAKFAST. TAKE FOR 5 DAYS, STARTING ON DAY 1 OF YOUR CHEMOTHERAPY 100 tablet 3   predniSONE (DELTASONE) 20 MG tablet TAKE 5 TABLETS (100 MG TOTAL) BY MOUTH DAILY WITH BREAKFAST. TAKE FOR 5 DAYS, STARTING ON DAY 1 OF YOUR CHEMOTHERAPY 100 tablet 3   prochlorperazine (COMPAZINE) 10 MG tablet TAKE 1 TABLET (10 MG TOTAL) BY MOUTH EVERY 6 (SIX) HOURS AS NEEDED FOR NAUSEA OR VOMITING. 30 tablet 3   senna-docusate (SENOKOT-S) 8.6-50 MG tablet Take 1 tablet by mouth 2 (two) times daily between meals as needed for mild constipation. 60 tablet 0   No current facility-administered medications for this visit.    REVIEW OF  SYSTEMS:   Constitutional: ( - ) fevers, ( - )  chills , ( - ) night sweats Eyes: ( - ) blurriness of vision, ( - ) double vision, ( - ) watery eyes Ears, nose, mouth, throat, and face: ( - ) mucositis, ( - ) sore throat Respiratory: ( - ) cough, ( - ) dyspnea, ( - ) wheezes Cardiovascular: ( - ) palpitation, ( - ) chest discomfort, ( - ) lower extremity swelling Gastrointestinal:  ( - ) nausea, ( - ) heartburn, ( - ) change in bowel habits Skin: ( - ) abnormal skin rashes Lymphatics: ( - ) new lymphadenopathy, ( - ) easy bruising Neurological: ( - ) numbness, ( - )   tingling, ( - ) new weaknesses Behavioral/Psych: ( - ) mood change, ( - ) new changes  All other systems were reviewed with the patient and are negative.  PHYSICAL EXAMINATION: ECOG PERFORMANCE STATUS: 0 - Asymptomatic  Vitals:   10/25/20 1501  BP: 111/73  Pulse: 76  Resp: 18  Temp: (!) 97 F (36.1 C)  SpO2: 99%    Filed Weights   10/25/20 1501  Weight: 138 lb 3 oz (62.7 kg)     GENERAL: well appearing middle aged [place] American male. alert, no distress and comfortable SKIN: skin color, texture, turgor are normal, no rashes or significant lesions EYES: conjunctiva are pink and non-injected, sclera clear LYMPH:  Deferred today LUNGS: clear to auscultation and percussion with normal breathing effort HEART: regular rate & rhythm and no murmurs and no lower extremity edema Musculoskeletal: no cyanosis of digits and no clubbing  PSYCH: alert & oriented x 3, fluent speech NEURO: no focal motor/sensory deficits  LABORATORY DATA:  I have reviewed the data as listed CBC Latest Ref Rng & Units 10/25/2020 07/13/2020 06/07/2020  WBC 4.0 - 10.5 K/uL 5.5 5.6 5.1  Hemoglobin 13.0 - 17.0 g/dL 11.9(L) 11.3(L) 10.6(L)  Hematocrit 39.0 - 52.0 % 34.8(L) 33.2(L) 32.4(L)  Platelets 150 - 400 K/uL 222 325 261    CMP Latest Ref Rng & Units 07/13/2020 06/07/2020 05/17/2020  Glucose 70 - 99 mg/dL 87 114(H) 107(H)  BUN 6 - 20 mg/dL  _0 Creatinine 0.61 - 1.24 mg/dL 0.82 0.74 0.78  Sodium 135 - 145 mmol/L 140 141 141  Potassium 3.5 - 5.1 mmol/L 4.1 4.0 4.2  Chloride 98 - 111 mmol/L 107 106 104  CO2 22 - 32 mmol/L _1 Calcium 8.9 - 10.3 mg/dL 9.6 9.3 9.2  Total Protein 6.5 - 8.1 g/dL 7.6 6.7 6.7  Total Bilirubin 0.3 - 1.2 mg/dL 0.3 0.3 <0.2(L)  Alkaline Phos 38 - 126 U/L 59 68 56  AST 15 - 41 U/L _2 ALT 0 - 44 U/L _3 RADIOGRAPHIC STUDIES: No results found.  ASSESSMENT & PLAN Tanner Gallagher 55 y.o. male with no significant medical history presents for evaluation of Anaplastic T Cell Lymphoma.  Review the labs, review the records, discussion with the patient the findings are most consistent with Anaplastic T Cell Lymphoma.     PET CT scan performed on 02/24/2020 confirmed Stage III disease with involvement of the axillary and inguinal lymph nodes. He has also had a port placed and echocardiogram performed. Post treatment PET CT scan on 06/29/2020 showed Deauville 3 inguinal lymph nodes, returned to normal size. Near complete resolution of axillary adenopathy.   Previously Mr. Cornforth has had the opportunity to ask any questions or concerns that he has had about his disease or chemotherapy moving forward.  He notes that he is aware of the risks and benefits of treatment and is willing and able to proceed with treatment today.  The treatment of choice for this patient's cancer would be BV-CHP chemotherapy.  This regimen consists of doxorubicin 50 mg per metered squared on day 1, cyclophosphamide 750 mg per metered squared on day 1, and brentuximab 1.8 mg/kg on day 1 of a 21-day cycle.  The patient also received prednisone 25m PO on days 1-5.  He completed 6 cycles.   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  --  port had been placed, TTE shows adequate heart function for anthracycline therapy.  --initial PET CT scan  shows involvement of the inguinal/axillary lymph nodes. Findings consistent with Stage III disease.  --patient completed 6 cycles of  BV-CHP chemotherapy as noted above. Cycle 1 Day 1 of treatment started on 02/23/2020.  --post treatment PET CT scan on 06/29/2020 showed Deauville 3 inguinal lymph nodes, returned to normal size. Near complete resolution of axillary adenopathy.  Plan: --Per NCCN recommendations, will follow the patient q 3 months with labs and q 6 months for CT C/A/P x 2 years.  --RTC in 3 months for continue monitoring. CT scan prior to next visit.   #Chemotherapy Induced Anemia --Hgb 11.9, trending upward, still mildly anemic --continue to monitor   No orders of the defined types were placed in this encounter.  All questions were answered. The patient knows to call the clinic with any problems, questions or concerns.  A total of more than 30 minutes were spent on this encounter and over half of that time was spent on counseling and coordination of care as outlined above.   Ledell Peoples, MD Department of Hematology/Oncology [place] [facility] Phone: [phone] Pager: [phone] Email: [email or web address]  10/25/2020 3:18 PM   Literature Support:  Lynden Ang OA, [place] B, [place], [place], [place], [place], [place], [place] F, Domingo-Domenech E, Margot Chimes [address], [place] , [place], [place], [place] SP, Shustov A, Httmann A, [place], Yuen S, Iyer S, Zinzani PL, Hua Z, [place] M, Romelle Starcher T, Trmper L; [place] Study Group. 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 Lancet. 2019 Jan 19;393(10168):229-240.   --Median progression-free survival was 482 months (95% CI 352-not evaluable) in the A+CHP group and 208 months (127-476) in the CHOP group (hazard  ratio 071 [95% CI 054-093], p=[id number]).   --Front-line treatment with A+CHP is superior to CHOP for patients with CD30-positive peripheral T-cell lymphomas as shown by a significant improvement in progression-free survival and overall survival with a manageable safety profile.

## 2020-10-26 ENCOUNTER — Encounter: Payer: Self-pay | Admitting: Dietician

## 2020-10-26 NOTE — Progress Notes (Signed)
Nutrition  Provided one complimentary case of Ensure [facility]

## 2020-10-28 ENCOUNTER — Encounter: Payer: Self-pay | Admitting: Hematology and Oncology

## 2020-10-29 ENCOUNTER — Telehealth: Payer: Self-pay | Admitting: Hematology and Oncology

## 2020-10-29 NOTE — Telephone Encounter (Signed)
Scheduled per 10/9 sch msg, pt has been called and confirmed appt.

## 2020-12-31 ENCOUNTER — Ambulatory Visit (HOSPITAL_COMMUNITY)
Admission: RE | Admit: 2020-12-31 | Discharge: 2020-12-31 | Disposition: A | Discharge status: Home / Self Care | Payer: Self-pay | Source: Ambulatory Visit | Attending: Hematology and Oncology | Admitting: Hematology and Oncology

## 2020-12-31 ENCOUNTER — Other Ambulatory Visit: Payer: Self-pay

## 2020-12-31 DIAGNOSIS — C8460 Anaplastic large cell lymphoma, ALK-positive, unspecified site: Secondary | ICD-10-CM | POA: Insufficient documentation

## 2020-12-31 MED ORDER — SODIUM CHLORIDE (PF) 0.9 % IJ SOLN
INTRAMUSCULAR | Status: AC
  Filled 2020-12-31: qty 50

## 2020-12-31 MED ORDER — IOHEXOL 350 MG/ML SOLN
80.0000 mL | Freq: Once | INTRAVENOUS | Status: AC | PRN
  Administered 2020-12-31: 80 mL via INTRAVENOUS

## 2021-01-08 ENCOUNTER — Telehealth: Payer: Self-pay

## 2021-01-08 NOTE — Telephone Encounter (Addendum)
Left message X2  Pt advised with verbal understanding

## 2021-01-08 NOTE — Telephone Encounter (Signed)
-----   Message from Orson Slick, MD sent at 01/07/2021  8:07 AM EST ----- Please let Mr. Peschke know that his CT scan showed no evidence of new or progressive disease. He remains in complete remission. We will see him back in Jan 2023.  ----- Message ----- From: Buel Ream, Rad Results In Sent: 01/01/2021  11:17 AM EST To: Orson Slick, MD

## 2021-01-29 NOTE — Progress Notes (Signed)
..  Patient Assist/Replace for the following has been terminated. Medication: Adcetris (brentuximab vedotin) Reason for Termination: Treatment completed on May 2022. Last DOS: 06/07/2020.  Marland KitchenJuan Quam, CPhT IV Drug Replacement Specialist [place] Phone: [phone]

## 2021-01-29 NOTE — Progress Notes (Signed)
..  Patient Assist/Replace for the following has been terminated. Medication: Udenyca ([id number]) Reason for Termination: Treatment completed in May 2022.  Last DOS: 06/09/2020.  Marland KitchenJuan Quam, CPhT IV Drug Replacement Specialist [place] Phone: [phone]

## 2021-01-30 ENCOUNTER — Other Ambulatory Visit: Payer: Self-pay

## 2021-01-30 ENCOUNTER — Inpatient Hospital Stay: Payer: Self-pay | Attending: Hematology and Oncology

## 2021-01-30 ENCOUNTER — Encounter: Payer: Self-pay | Admitting: Dietician

## 2021-01-30 ENCOUNTER — Inpatient Hospital Stay (HOSPITAL_BASED_OUTPATIENT_CLINIC_OR_DEPARTMENT_OTHER): Payer: Self-pay | Admitting: Hematology and Oncology

## 2021-01-30 VITALS — BP 103/74 | HR 72 | Temp 97.3°F | Resp 16 | Wt 144.6 lb

## 2021-01-30 DIAGNOSIS — C8470 Anaplastic large cell lymphoma, ALK-negative, unspecified site: Secondary | ICD-10-CM | POA: Insufficient documentation

## 2021-01-30 DIAGNOSIS — C8460 Anaplastic large cell lymphoma, ALK-positive, unspecified site: Secondary | ICD-10-CM

## 2021-01-30 DIAGNOSIS — C8442 Peripheral T-cell lymphoma, not classified, intrathoracic lymph nodes: Secondary | ICD-10-CM

## 2021-01-30 DIAGNOSIS — Z87891 Personal history of nicotine dependence: Secondary | ICD-10-CM | POA: Insufficient documentation

## 2021-01-30 DIAGNOSIS — R599 Enlarged lymph nodes, unspecified: Secondary | ICD-10-CM

## 2021-01-30 LAB — CMP (CANCER CENTER ONLY)
ALT: 15 U/L (ref 0–44)
AST: 17 U/L (ref 15–41)
Albumin: 4 g/dL (ref 3.5–5.0)
Alkaline Phosphatase: 54 U/L (ref 38–126)
Anion gap: 4 — ABNORMAL LOW (ref 5–15)
BUN: 14 mg/dL (ref 6–20)
CO2: 31 mmol/L (ref 22–32)
Calcium: 9.5 mg/dL (ref 8.9–10.3)
Chloride: 103 mmol/L (ref 98–111)
Creatinine: 0.95 mg/dL (ref 0.61–1.24)
GFR, Estimated: >60 mL/min (ref >60)
Glucose, Bld: 130 mg/dL — ABNORMAL HIGH (ref 70–99)
Potassium: 4.6 mmol/L (ref 3.5–5.1)
Sodium: 138 mmol/L (ref 135–145)
Total Bilirubin: 0.5 mg/dL (ref 0.3–1.2)
Total Protein: 7.5 g/dL (ref 6.5–8.1)

## 2021-01-30 LAB — CBC WITH DIFFERENTIAL (CANCER CENTER ONLY)
Abs Immature Granulocytes: 0.01 10*3/uL (ref 0.00–0.07)
Basophils Absolute: 0.1 10*3/uL (ref 0.0–0.1)
Basophils Relative: 1 %
Eosinophils Absolute: 0.5 10*3/uL (ref 0.0–0.5)
Eosinophils Relative: 10 %
HCT: 39.2 % (ref 39.0–52.0)
Hemoglobin: 13.4 g/dL (ref 13.0–17.0)
Immature Granulocytes: 0 %
Lymphocytes Relative: 47 %
Lymphs Abs: 2.2 10*3/uL (ref 0.7–4.0)
MCH: 31.5 pg (ref 26.0–34.0)
MCHC: 34.2 g/dL (ref 30.0–36.0)
MCV: 92.2 fL (ref 80.0–100.0)
Monocytes Absolute: 0.4 10*3/uL (ref 0.1–1.0)
Monocytes Relative: 8 %
Neutro Abs: 1.6 10*3/uL — ABNORMAL LOW (ref 1.7–7.7)
Neutrophils Relative %: 34 %
Platelet Count: 278 10*3/uL (ref 150–400)
RBC: 4.25 MIL/uL (ref 4.22–5.81)
RDW: 15.3 % (ref 11.5–15.5)
WBC Count: 4.8 10*3/uL (ref 4.0–10.5)
nRBC: 0 % (ref 0.0–0.2)

## 2021-01-30 LAB — LACTATE DEHYDROGENASE: LDH: 126 U/L (ref 98–192)

## 2021-01-30 NOTE — Progress Notes (Signed)
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 Previously seen in June 2021 and given course of doxycycline. Infectious workup including RPR, HIV, GC/chlamydia were negative.  2) 09/23/2019: presented to PCP with concern for bilaterally enlarged lymph nodes of the groin. Noted to have scant drainage 3) 10/12/2019: establish care with Dr. 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  5) 02/23/2020: Cycle 1 Day 1 of BV-CHP 6) 03/15/2020: Cycle 2 Day 1 of BV-CHP 7) 04/05/2020: Cycle 3 Day 1 of BV-CHP 8) 04/26/2020: Cycle 4 Day 1 of BV-CHP 9) 05/17/2020:  Cycle 5 Day 1 of BV-CHP 10) 06/07/2020: Cycle 6 Day 1 of BV-CHP 11) 06/29/2020: PET CT scan showed near complete resolution of left axillary metabolic adenopathy as well as marked reduction in the metabolic activity of the inguinal lymph nodes down to Deauville score 3.  Findings are consistent with a [place] complete metabolic response  Interval History:  Tanner Gallagher 56 y.o. male with medical history significant for Anaplastic T Cell Lymphoma who presents for a follow up visit. The patient's last visit was on 10/25/2020. In the interim since the last visit the patient has had a CT scan which showed no new or worsening lymphadenopathy.   On exam today Mr. Pohle notes he has been well since the start of the new year.  He reports that his weight has been steadily increasing intentionally, and that he is not having any palpable lymphadenopathy or new lymph nodes.  He notes that the abscess on his backside does occasionally have some modest drainage but has been under control lately.  He overall feels well and has no questions  concerns or complaints today.  Otherwise he denies any fevers, chills, sweats, vomiting or diarrhea. A full 10 point ROS is listed below.  MEDICAL HISTORY:  Past Medical History:  Diagnosis Date   Cancer [facility])    lymph nodes   Chronic lower back pain 15 years   Medical history non-contributory    Tooth pain    no abscess or drainage per pt 02-01-2020    SURGICAL HISTORY: Past Surgical History:  Procedure Laterality Date   INCISION AND DRAINAGE PERIRECTAL ABSCESS  06/16/2017   INCISION AND DRAINAGE PERIRECTAL ABSCESS N/A 06/16/2017   Procedure: IRRIGATION AND DEBRIDEMENT PERIRECTAL ABSCESS;  Surgeon: Coralie Keens, MD;  Location: [place];  Service: General;  Laterality: N/A;   INGUINAL HERNIA REPAIR Right 2000s   IR IMAGING GUIDED PORT INSERTION  02/15/2020   IR REMOVAL TUN ACCESS W/ PORT W/O FL MOD SED  09/14/2020   LYMPH NODE BIOPSY Right 02/02/2020   Procedure: EXCISIONAL INGUINAL LYMPH NODE BIOPSY;  Surgeon: Leighton Ruff, MD;  Location[facility];  Service: General;  Laterality: Right;   PROCTOSCOPY  06/16/2017   Procedure: RIGID PROCTOSCOPY;  Surgeon: Coralie Keens, MD;  Location: [facility] OR;  Service: General;;    SOCIAL HISTORY: Social History   Socioeconomic History   Marital status: Married    Spouse name: Not on file   Number of children: Not on file   Years of education: Not on file   Highest education level: Not on file  Occupational History   Not on file  Tobacco Use  Smoking status: Former    Packs/day: 0.12    Years: 37.00    Pack years: 4.44    Types: Cigarettes    Quit date: 05/20/2017    Years since quitting: 3.7   Smokeless tobacco: Never  Vaping Use   Vaping Use: Never used  Substance and Sexual Activity   Alcohol use: Not Currently    Comment: rare   Drug use: Yes    Types: Marijuana    Comment: 02-01-2020 "once a day"; used in last 24 hours   Sexual activity: Yes  Other Topics Concern   Not on file  Social History Narrative    Not on file   Social Determinants of Health   Financial Resource Strain: Not on file  Food Insecurity: Not on file  Transportation Needs: Not on file  Physical Activity: Not on file  Stress: Not on file  Social Connections: Not on file  Intimate Partner Violence: Not on file    FAMILY HISTORY: Family History  Problem Relation Age of Onset   Diabetes Neg Hx     ALLERGIES:  has No Known Allergies.  MEDICATIONS:  Current Outpatient Medications  Medication Sig Dispense Refill   acetaminophen (TYLENOL) 500 MG tablet Take 1,000 mg by mouth every 6 (six) hours as needed for mild pain, fever or headache.     allopurinol (ZYLOPRIM) 300 MG tablet TAKE 1 TABLET (300 MG TOTAL) BY MOUTH DAILY. 30 tablet 3   lidocaine-prilocaine (EMLA) cream APPLY 1 APPLICATION TOPICALLY AS NEEDED. (Patient taking differently: Apply 1 application topically as needed (port access/hemmoroids).) 30 g 1   ondansetron (ZOFRAN) 8 MG tablet TAKE 1 TABLET (8 MG TOTAL) BY MOUTH EVERY 8 (EIGHT) HOURS AS NEEDED FOR NAUSEA OR VOMITING. 20 tablet 2   pantoprazole (PROTONIX) 40 MG tablet TAKE 1 TABLET (40 MG TOTAL) BY MOUTH DAILY. 30 tablet 3   predniSONE (DELTASONE) 20 MG tablet TAKE 5 TABLETS (100 MG TOTAL) BY MOUTH DAILY WITH BREAKFAST. TAKE FOR 5 DAYS, STARTING ON DAY 1 OF YOUR CHEMOTHERAPY 100 tablet 3   predniSONE (DELTASONE) 20 MG tablet TAKE 5 TABLETS (100 MG TOTAL) BY MOUTH DAILY WITH BREAKFAST. TAKE FOR 5 DAYS, STARTING ON DAY 1 OF YOUR CHEMOTHERAPY 100 tablet 3   prochlorperazine (COMPAZINE) 10 MG tablet TAKE 1 TABLET (10 MG TOTAL) BY MOUTH EVERY 6 (SIX) HOURS AS NEEDED FOR NAUSEA OR VOMITING. 30 tablet 3   senna-docusate (SENOKOT-S) 8.6-50 MG tablet Take 1 tablet by mouth 2 (two) times daily between meals as needed for mild constipation. 60 tablet 0   No current facility-administered medications for this visit.    REVIEW OF SYSTEMS:   Constitutional: ( - ) fevers, ( - )  chills , ( - ) night sweats Eyes: (  - ) blurriness of vision, ( - ) double vision, ( - ) watery eyes Ears, nose, mouth, throat, and face: ( - ) mucositis, ( - ) sore throat Respiratory: ( - ) cough, ( - ) dyspnea, ( - ) wheezes Cardiovascular: ( - ) palpitation, ( - ) chest discomfort, ( - ) lower extremity swelling Gastrointestinal:  ( - ) nausea, ( - ) heartburn, ( - ) change in bowel habits Skin: ( - ) abnormal skin rashes Lymphatics: ( - ) new lymphadenopathy, ( - ) easy bruising Neurological: ( - ) numbness, ( - ) tingling, ( - ) new weaknesses Behavioral/Psych: ( - ) mood change, ( - ) new changes  All other systems were reviewed with  the patient and are negative.  PHYSICAL EXAMINATION: ECOG PERFORMANCE STATUS: 0 - Asymptomatic  Vitals:   01/30/21 0954  BP: 103/74  Pulse: 72  Resp: 16  Temp: (!) 97.3 F (36.3 C)  SpO2: 100%    Filed Weights   01/30/21 0954  Weight: 144 lb 9 oz (65.6 kg)     GENERAL: well appearing middle aged [place] American male. alert, no distress and comfortable SKIN: skin color, texture, turgor are normal, no rashes or significant lesions EYES: conjunctiva are pink and non-injected, sclera clear LYMPH:  Deferred today LUNGS: clear to auscultation and percussion with normal breathing effort HEART: regular rate & rhythm and no murmurs and no lower extremity edema Musculoskeletal: no cyanosis of digits and no clubbing  PSYCH: alert & oriented x 3, fluent speech NEURO: no focal motor/sensory deficits  LABORATORY DATA:  I have reviewed the data as listed CBC Latest Ref Rng & Units 01/30/2021 10/25/2020 07/13/2020  WBC 4.0 - 10.5 K/uL 4.8 5.5 5.6  Hemoglobin 13.0 - 17.0 g/dL 13.4 11.9(L) 11.3(L)  Hematocrit 39.0 - 52.0 % 39.2 34.8(L) 33.2(L)  Platelets 150 - 400 K/uL 278 222 325    CMP Latest Ref Rng & Units 01/30/2021 10/25/2020 07/13/2020  Glucose 70 - 99 mg/dL 130(H) 122(H) 87  BUN 6 - 20 mg/dL _0 Creatinine 0.61 - 1.24 mg/dL 0.95 1.03 0.82  Sodium 135 - 145 mmol/L 138 139  140  Potassium 3.5 - 5.1 mmol/L 4.6 4.2 4.1  Chloride 98 - 111 mmol/L 103 105 107  CO2 22 - 32 mmol/L _1 Calcium 8.9 - 10.3 mg/dL 9.5 9.3 9.6  Total Protein 6.5 - 8.1 g/dL 7.5 7.3 7.6  Total Bilirubin 0.3 - 1.2 mg/dL 0.5 0.4 0.3  Alkaline Phos 38 - 126 U/L 54 66 59  AST 15 - 41 U/L _2 ALT 0 - 44 U/L _3 RADIOGRAPHIC STUDIES: CT CHEST ABDOMEN PELVIS W CONTRAST  Result Date: 01/01/2021 CLINICAL DATA:  History of lymphoma diagnosed 12/10/2019. Chemotherapy complete. EXAM: CT CHEST, ABDOMEN, AND PELVIS WITH CONTRAST TECHNIQUE: Multidetector CT imaging of the chest, abdomen and pelvis was performed following the standard protocol during bolus administration of intravenous contrast. CONTRAST:  71m OMNIPAQUE IOHEXOL 350 MG/ML SOLN COMPARISON:  Multiple priors including most recent PET-CT June 29, 2020 FINDINGS: CT CHEST FINDINGS Cardiovascular: No significant vascular findings. Normal heart size. No pericardial effusion. Mediastinum/Nodes: No supraclavicular adenopathy. No discrete thyroid nodule. No pathologically enlarged mediastinal, hilar or axillary lymph nodes. The trachea and esophagus are grossly unremarkable. Lungs/Pleura: Biapical pleuroparenchymal scarring, mild and similar prior. No suspicious pulmonary nodules or masses. No pleural effusion. No pneumothorax. Musculoskeletal: Mild thoracic spondylosis. No aggressive lytic or blastic lesion of bone. 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 Hyperenhancing 7 mm focus in segment III on image 65/2 and hyperenhancing 8 mm segment IVb focus are stable dating back to October 21, 2019 and likely reflect flash filling hemangiomas. No new suspicious solid hepatic. Pancreas: No pancreatic ductal dilation or evidence of acute inflammation. Spleen: Normal in size without focal abnormality. Adrenals/Urinary Tract: Adrenal glands are unremarkable. Kidneys demonstrate  symmetric enhancement and excretion of contrast, without renal calculi, solid enhancing lesion, or hydronephrosis. Bladder is unremarkable for degree of distension. Stomach/Bowel: Radiopaque enteric contrast material traverses the splenic flexure. Stomach is unremarkable. No pathologic dilation of small or large bowel. The appendix and terminal ileum  appear normal. Moderate volume of formed stool throughout the colon suggestive of constipation. No suspicious colonic wall thickening or mass like lesions identified. Vascular/Lymphatic: No abdominal aortic aneurysm. No pathologically enlarged abdominal or pelvic lymph nodes. Stable prominent bilateral inguinal lymph nodes with the previously indexed left inguinal lymph node measuring 8 mm on image 124/2, unchanged. Reproductive: Prostate is unremarkable. Other: Similar perirectal/perianal rectal wall thickening and adjacent inflammation consistent with known perirectal/perianal fistula. Slightly increased size of the subcutaneous inflammatory nodularity at the skin surface of the left gluteal crease no measuring 19 mm in maximum axial dimension on image 121/2 previously 17 mm when remeasured for consistency. Musculoskeletal: Multilevel degenerative changes spine. Advanced degenerative changes bilateral hips. No acute osseous abnormality. IMPRESSION: 1. Stable examination including prominent inguinal lymph nodes without pathologically enlarged lymph nodes within the chest, abdomen, or pelvis and no splenomegaly. 2. Persistent rectal wall thickening with adjacent inflammation associated with the known perianal/perirectal fistula, with slightly increased size of the subcutaneous inflammatory nodularity at the skin surface of the left gluteal crease. No new drainable fluid collection. 3. Moderate volume of formed stool throughout the colon suggestive of constipation. Electronically Signed   By: Dahlia Bailiff M.D.   On: 01/01/2021 11:15    ASSESSMENT & PLAN Raeford Brandenburg  56 y.o. male with no significant medical history presents for evaluation of Anaplastic T Cell Lymphoma.  Review the labs, review the records, discussion with the patient the findings are most consistent with Anaplastic T Cell Lymphoma.     PET CT scan performed on 02/24/2020 confirmed Stage III disease with involvement of the axillary and inguinal lymph nodes. He has also had a port placed and echocardiogram performed. Post treatment PET CT scan on 06/29/2020 showed Deauville 3 inguinal lymph nodes, returned to normal size. Near complete resolution of axillary adenopathy. CT scan on 01/09/2021 showed stable examination including prominent inguinal lymph nodes without pathologically enlarged lymph nodes within the chest, abdomen, or pelvis and no splenomegaly.  The treatment of choice for this patient's cancer would be BV-CHP chemotherapy.  This regimen consists of doxorubicin 50 mg per metered squared on day 1, cyclophosphamide 750 mg per metered squared on day 1, and brentuximab 1.8 mg/kg on day 1 of a 21-day cycle.  The patient also received prednisone 12m PO on days 1-5.  He completed 6 cycles.   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  --port had been placed, TTE shows adequate heart function for anthracycline therapy.  --initial PET CT scan shows involvement of the inguinal/axillary lymph nodes. Findings consistent with Stage III disease.  --patient completed 6 cycles of  BV-CHP chemotherapy as noted above. Cycle 1 Day 1 of treatment started on 02/23/2020.  --post treatment PET CT scan on 06/29/2020 showed Deauville 3 inguinal lymph nodes, returned to normal size. Near complete resolution of axillary adenopathy.  Plan: --Per NCCN recommendations, will follow the patient q 3 months with labs and q 6 months for CT C/A/P x 2 years. Next CT scan due in June 2023.  --RTC in 3 months for continue monitoring.    #Chemotherapy Induced Anemia-resolved --Hgb 13.4, trending upward --continue to monitor   No orders of the defined types were placed in this encounter.   All questions were answered. The patient knows to call the clinic with any problems, questions or concerns.  A total of more than 30 minutes were spent on this encounter and  over half of that time was spent on counseling and coordination of care as outlined above.   Ledell Peoples, MD Department of Hematology/Oncology [place] [facility] Phone: [phone] Pager: [phone] Email: [email or web address]  01/30/2021 10:43 AM   Literature Support:  Lynden Ang OA, [place] B, [place], [place], [place], [place], [place], Morsch[place] F, Domingo-Domenech E, Margot Chimes [address], [place] , [place], [place], [place] SP, Sh[place] A, Httmann A, [place], Yu[place] S, Iyer S, Zinzani PL, Hua Z, [place] M, Romelle Starcher T, Trm[place] L; [place] Study Group. 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 Lancet. 2019 Jan 19;393(10168):229-240.   --Median progression-free survival was 482 months (95% CI 352-not evaluable) in the A+CHP group and 208 months (127-476) in the CHOP group (hazard ratio 071 [95% CI 054-093], p=[id number]).   --Front-line treatment with A+CHP is superior to CHOP for patients with CD30-positive peripheral T-cell lymphomas as shown by a significant improvement in progression-free survival and overall survival with a manageable safety profile.

## 2021-01-30 NOTE — Progress Notes (Signed)
Provided one complimentary case of Ensure Plus High Protein 

## 2021-01-31 ENCOUNTER — Telehealth: Payer: Self-pay | Admitting: Hematology and Oncology

## 2021-01-31 NOTE — Telephone Encounter (Signed)
Scheduled per 1/11 los, pt has been called and confirmed

## 2021-04-29 ENCOUNTER — Inpatient Hospital Stay (HOSPITAL_BASED_OUTPATIENT_CLINIC_OR_DEPARTMENT_OTHER): Payer: Self-pay | Admitting: Hematology and Oncology

## 2021-04-29 ENCOUNTER — Inpatient Hospital Stay: Payer: Self-pay | Attending: Hematology and Oncology

## 2021-04-29 ENCOUNTER — Encounter: Payer: Self-pay | Admitting: Nutrition

## 2021-04-29 ENCOUNTER — Telehealth (HOSPITAL_COMMUNITY): Payer: Self-pay | Admitting: Dietician

## 2021-04-29 ENCOUNTER — Other Ambulatory Visit: Payer: Self-pay

## 2021-04-29 ENCOUNTER — Other Ambulatory Visit: Payer: Self-pay | Admitting: Hematology and Oncology

## 2021-04-29 VITALS — BP 115/77 | HR 69 | Temp 97.6°F | Resp 18 | Ht 68.0 in | Wt 144.2 lb

## 2021-04-29 DIAGNOSIS — C8460 Anaplastic large cell lymphoma, ALK-positive, unspecified site: Secondary | ICD-10-CM

## 2021-04-29 DIAGNOSIS — C8442 Peripheral T-cell lymphoma, not classified, intrathoracic lymph nodes: Secondary | ICD-10-CM

## 2021-04-29 DIAGNOSIS — Z87891 Personal history of nicotine dependence: Secondary | ICD-10-CM | POA: Insufficient documentation

## 2021-04-29 DIAGNOSIS — C8470 Anaplastic large cell lymphoma, ALK-negative, unspecified site: Secondary | ICD-10-CM | POA: Insufficient documentation

## 2021-04-29 LAB — CMP (CANCER CENTER ONLY)
ALT: 13 U/L (ref 0–44)
AST: 16 U/L (ref 15–41)
Albumin: 4 g/dL (ref 3.5–5.0)
Alkaline Phosphatase: 63 U/L (ref 38–126)
Anion gap: 3 — ABNORMAL LOW (ref 5–15)
BUN: 17 mg/dL (ref 6–20)
CO2: 30 mmol/L (ref 22–32)
Calcium: 9.4 mg/dL (ref 8.9–10.3)
Chloride: 107 mmol/L (ref 98–111)
Creatinine: 0.98 mg/dL (ref 0.61–1.24)
GFR, Estimated: >60 mL/min (ref >60)
Glucose, Bld: 108 mg/dL — ABNORMAL HIGH (ref 70–99)
Potassium: 4.7 mmol/L (ref 3.5–5.1)
Sodium: 140 mmol/L (ref 135–145)
Total Bilirubin: 0.4 mg/dL (ref 0.3–1.2)
Total Protein: 7.4 g/dL (ref 6.5–8.1)

## 2021-04-29 LAB — CBC WITH DIFFERENTIAL (CANCER CENTER ONLY)
Abs Immature Granulocytes: 0 10*3/uL (ref 0.00–0.07)
Basophils Absolute: 0.1 10*3/uL (ref 0.0–0.1)
Basophils Relative: 1 %
Eosinophils Absolute: 0.5 10*3/uL (ref 0.0–0.5)
Eosinophils Relative: 9 %
HCT: 39.8 % (ref 39.0–52.0)
Hemoglobin: 13.3 g/dL (ref 13.0–17.0)
Immature Granulocytes: 0 %
Lymphocytes Relative: 49 %
Lymphs Abs: 2.7 10*3/uL (ref 0.7–4.0)
MCH: 30.9 pg (ref 26.0–34.0)
MCHC: 33.4 g/dL (ref 30.0–36.0)
MCV: 92.3 fL (ref 80.0–100.0)
Monocytes Absolute: 0.5 10*3/uL (ref 0.1–1.0)
Monocytes Relative: 10 %
Neutro Abs: 1.6 10*3/uL — ABNORMAL LOW (ref 1.7–7.7)
Neutrophils Relative %: 31 %
Platelet Count: 216 10*3/uL (ref 150–400)
RBC: 4.31 MIL/uL (ref 4.22–5.81)
RDW: 14.4 % (ref 11.5–15.5)
WBC Count: 5.3 10*3/uL (ref 4.0–10.5)
nRBC: 0 % (ref 0.0–0.2)

## 2021-04-29 LAB — LACTATE DEHYDROGENASE: LDH: 134 U/L (ref 98–192)

## 2021-04-29 NOTE — Progress Notes (Signed)
Provided 1 complementary case of Ensure plus high-protein. ?

## 2021-04-29 NOTE — Progress Notes (Signed)
?[place] ?Telephone:(336) [phone]   [phone] ? ?PROGRESS NOTE ? ?Patient Care Team: ?Pcp, No as PCP - General ? 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 Previously seen in June 2021 and given course of doxycycline. Infectious workup including RPR, HIV, GC/chlamydia were negative.  ?2) 09/23/2019: presented to PCP with concern for bilaterally enlarged lymph nodes of the groin. Noted to have scant drainage ?3) 10/12/2019: establish care with Dr. 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  ?5) 02/23/2020: Cycle 1 Day 1 of BV-CHP ?6) 03/15/2020: Cycle 2 Day 1 of BV-CHP ?7) 04/05/2020: Cycle 3 Day 1 of BV-CHP ?8) 04/26/2020: Cycle 4 Day 1 of BV-CHP ?9) 05/17/2020:  Cycle 5 Day 1 of BV-CHP ?10) 06/07/2020: Cycle 6 Day 1 of BV-CHP ?11) 06/29/2020: PET CT scan showed near complete resolution of left axillary metabolic adenopathy as well as marked reduction in the metabolic activity of the inguinal lymph nodes down to Deauville score 3.  Findings are consistent with a [place] complete metabolic response ? ?Interval History:  ?Tanner Gallagher 56 y.o. male with medical history significant for Anaplastic T Cell Lymphoma who presents for a follow up visit. The patient's last visit was on 01/30/2021. In the interim since the last visit the patient has had a CT scan which showed no new or worsening lymphadenopathy.  ? ?On exam today Tanner Gallagher notes he has been well overall interim since her last visit.  He notes that his energy levels are good and his weight has been holding steady at approximately 144 pounds.  He notes his goal is to have a weight of 100 5205 pounds.  He notes he has not noticed any lymphadenopathy or enlarged lymph nodes in the interim since her last visit.  He notes that the abscess on his  backside still drains "a little" in the interim.  No major changes in this finding.  He overall feels quite well and has no questions concerns or complaints today. Otherwise he denies any fevers, chills, sweats, vomiting or diarrhea. A full 10 point ROS is listed below. ? ?MEDICAL HISTORY:  ?Past Medical History:  ?Diagnosis Date  ? Cancer [facility])   ? lymph nodes  ? Chronic lower back pain 15 years  ? Medical history non-contributory   ? Tooth pain   ? no abscess or drainage per pt 02-01-2020  ? ? ?SURGICAL HISTORY: ?Past Surgical History:  ?Procedure Laterality Date  ? INCISION AND DRAINAGE PERIRECTAL ABSCESS  06/16/2017  ? INCISION AND DRAINAGE PERIRECTAL ABSCESS N/A 06/16/2017  ? Procedure: IRRIGATION AND DEBRIDEMENT PERIRECTAL ABSCESS;  Surgeon: Coralie Keens, MD;  Location: [place];  Service: General;  Laterality: N/A;  ? INGUINAL HERNIA REPAIR Right 2000s  ? IR IMAGING GUIDED PORT INSERTION  02/15/2020  ? IR REMOVAL TUN ACCESS W/ PORT W/O FL MOD SED  09/14/2020  ? LYMPH NODE BIOPSY Right 02/02/2020  ? Procedure: EXCISIONAL INGUINAL LYMPH NODE BIOPSY;  Surgeon: Leighton Ruff, MD;  Location: [facility];  Service: General;  Laterality: Right;  ? PROCTOSCOPY  06/16/2017  ? Procedure: RIGID PROCTOSCOPY;  Surgeon: Coralie Keens, MD;  Location: [place];  Service: General;;  ? ? ?SOCIAL HISTORY: ?Social History  ? ?Socioeconomic History  ? Marital status: Married  ?  Spouse name: Not on file  ? Number of children: Not  on file  ? Years of education: Not on file  ? Highest education level: Not on file  ?Occupational History  ? Not on file  ?Tobacco Use  ? Smoking status: Former  ?  Packs/day: 0.12  ?  Years: 37.00  ?  Pack years: 4.44  ?  Types: Cigarettes  ?  Quit date: 05/20/2017  ?  Years since quitting: 3.9  ? Smokeless tobacco: Never  ?Vaping Use  ? Vaping Use: Never used  ?Substance and Sexual Activity  ? Alcohol use: Not Currently  ?  Comment: rare  ? Drug use: Yes  ?  Types: Marijuana  ?  Comment:  02-01-2020 "once a day"; used in last 24 hours  ? Sexual activity: Yes  ?Other Topics Concern  ? Not on file  ?Social History Narrative  ? Not on file  ? ?Social Determinants of Health  ? ?Financial Resource Strain: Not on file  ?Food Insecurity: Not on file  ?Transportation Needs: Not on file  ?Physical Activity: Not on file  ?Stress: Not on file  ?Social Connections: Not on file  ?Intimate Partner Violence: Not on file  ? ? ?FAMILY HISTORY: ?Family History  ?Problem Relation Age of Onset  ? Diabetes Neg Hx   ? ? ?ALLERGIES:  has No Known Allergies. ? ?MEDICATIONS:  ?No current outpatient medications on file.  ? ?No current facility-administered medications for this visit.  ? ? ?REVIEW OF SYSTEMS:   ?Constitutional: ( - ) fevers, ( - )  chills , ( - ) night sweats ?Eyes: ( - ) blurriness of vision, ( - ) double vision, ( - ) watery eyes ?Ears, nose, mouth, throat, and face: ( - ) mucositis, ( - ) sore throat ?Respiratory: ( - ) cough, ( - ) dyspnea, ( - ) wheezes ?Cardiovascular: ( - ) palpitation, ( - ) chest discomfort, ( - ) lower extremity swelling ?Gastrointestinal:  ( - ) nausea, ( - ) heartburn, ( - ) change in bowel habits ?Skin: ( - ) abnormal skin rashes ?Lymphatics: ( - ) new lymphadenopathy, ( - ) easy bruising ?Neurological: ( - ) numbness, ( - ) tingling, ( - ) new weaknesses ?Behavioral/Psych: ( - ) mood change, ( - ) new changes  ?All other systems were reviewed with the patient and are negative. ? ?PHYSICAL EXAMINATION: ?ECOG PERFORMANCE STATUS: 0 - Asymptomatic ? ?Vitals:  ? 04/29/21 1051  ?BP: 115/77  ?Pulse: 69  ?Resp: 18  ?Temp: 97.6 ?F (36.4 ?C)  ?SpO2: 100%  ? ? ? ?Filed Weights  ? 04/29/21 1051  ?Weight: 144 lb 3 oz (65.4 kg)  ? ? ? ? ?GENERAL: well appearing middle aged [place] American male. alert, no distress and comfortable ?SKIN: skin color, texture, turgor are normal, no rashes or significant lesions ?EYES: conjunctiva are pink and non-injected, sclera clear ?LYMPH: No lymphadenopathy  palpable in the cervical lymph nodes.  No palpable lymphadenopathy in the groin ?LUNGS: clear to auscultation and percussion with normal breathing effort ?HEART: regular rate & rhythm and no murmurs and no lower extremity edema ?Musculoskeletal: no cyanosis of digits and no clubbing  ?PSYCH: alert & oriented x 3, fluent speech ?NEURO: no focal motor/sensory deficits ? ?LABORATORY DATA:  ?I have reviewed the data as listed ? ?  Latest Ref Rng & Units 04/29/2021  ? 10:38 AM 01/30/2021  ?  9:40 AM 10/25/2020  ?  2:51 PM  ?CBC  ?WBC 4.0 - 10.5 K/uL 5.3   4.8   5.5    ?  Hemoglobin 13.0 - 17.0 g/dL 13.3   13.4   11.9    ?Hematocrit 39.0 - 52.0 % 39.8   39.2   34.8    ?Platelets 150 - 400 K/uL 216   278   222    ? ? ? ?  Latest Ref Rng & Units 04/29/2021  ? 10:38 AM 01/30/2021  ?  9:40 AM 10/25/2020  ?  2:51 PM  ?CMP  ?Glucose 70 - 99 mg/dL 108   130   122    ?BUN 6 - 20 mg/dL $Remove'17   14   18    'ZNyeQev$ ?Creatinine 0.61 - 1.24 mg/dL 0.98   0.95   1.03    ?Sodium 135 - 145 mmol/L 140   138   139    ?Potassium 3.5 - 5.1 mmol/L 4.7   4.6   4.2    ?Chloride 98 - 111 mmol/L 107   103   105    ?CO2 22 - 32 mmol/L $RemoveB'30   31   26    'BqwjzzMw$ ?Calcium 8.9 - 10.3 mg/dL 9.4   9.5   9.3    ?Total Protein 6.5 - 8.1 g/dL 7.4   7.5   7.3    ?Total Bilirubin 0.3 - 1.2 mg/dL 0.4   0.5   0.4    ?Alkaline Phos 38 - 126 U/L 63   54   66    ?AST 15 - 41 U/L $Remo'16   17   18    'QBTnz$ ?ALT 0 - 44 U/L $Remo'13   15   13    'UkzYb$ ? ? ?RADIOGRAPHIC STUDIES: ?No results found. ? ?ASSESSMENT & PLAN ?Lynnell Chad 56 y.o. male with no significant medical history presents for evaluation of Anaplastic T Cell Lymphoma.  Review the labs, review the records, discussion with the patient the findings are most consistent with Anaplastic T Cell Lymphoma.   ? ? PET CT scan performed on 02/24/2020 confirmed Stage III disease with involvement of the axillary and inguinal lymph nodes. He has also had a port placed and echocardiogram performed. Post treatment PET CT scan on 06/29/2020 showed Deauville 3 inguinal lymph  nodes, returned to normal size. Near complete resolution of axillary adenopathy. CT scan on 01/09/2021 showed stable examination including prominent inguinal lymph nodes without pathologically enlarged lymph

## 2021-06-25 ENCOUNTER — Ambulatory Visit (HOSPITAL_COMMUNITY)
Admission: RE | Admit: 2021-06-25 | Discharge: 2021-06-25 | Disposition: A | Discharge status: Home / Self Care | Payer: Self-pay | Source: Ambulatory Visit | Attending: Hematology and Oncology | Admitting: Hematology and Oncology

## 2021-06-25 DIAGNOSIS — C8442 Peripheral T-cell lymphoma, not classified, intrathoracic lymph nodes: Secondary | ICD-10-CM | POA: Insufficient documentation

## 2021-06-25 MED ORDER — SODIUM CHLORIDE (PF) 0.9 % IJ SOLN
INTRAMUSCULAR | Status: AC
  Filled 2021-06-25: qty 50

## 2021-06-25 MED ORDER — IOHEXOL 300 MG/ML  SOLN
100.0000 mL | Freq: Once | INTRAMUSCULAR | Status: AC | PRN
  Administered 2021-06-25: 100 mL via INTRAVENOUS

## 2021-06-27 ENCOUNTER — Telehealth: Payer: Self-pay | Admitting: *Deleted

## 2021-06-27 NOTE — Telephone Encounter (Signed)
TCT patient regarding scan results.  No answer but was able to leave detailed message on his identified vm. Advised that recent CT scan showed no residual/ recurrent disease. Advised that we will see him back in July 2023 Provided phone # [phone] if he has any questions or concerns.

## 2021-06-27 NOTE — Telephone Encounter (Signed)
-----   Message from Orson Slick, MD sent at 06/26/2021  8:02 AM EDT ----- Please let Tanner Gallagher know that there is no evidence of residual/recurrent disease on his most recent CT scan. We will see him back in July 2023.  ----- Message ----- From: Buel Ream, Rad Results In Sent: 06/26/2021   7:35 AM EDT To: Orson Slick, MD

## 2021-07-28 ENCOUNTER — Other Ambulatory Visit: Payer: Self-pay | Admitting: Hematology and Oncology

## 2021-07-28 DIAGNOSIS — C8460 Anaplastic large cell lymphoma, ALK-positive, unspecified site: Secondary | ICD-10-CM

## 2021-07-29 ENCOUNTER — Inpatient Hospital Stay: Payer: Self-pay | Attending: Hematology and Oncology

## 2021-07-29 ENCOUNTER — Inpatient Hospital Stay (HOSPITAL_BASED_OUTPATIENT_CLINIC_OR_DEPARTMENT_OTHER): Payer: Self-pay | Admitting: Hematology and Oncology

## 2021-07-29 ENCOUNTER — Other Ambulatory Visit: Payer: Self-pay

## 2021-07-29 VITALS — BP 109/75 | HR 60 | Temp 98.0°F | Resp 16 | Ht 68.0 in | Wt 142.1 lb

## 2021-07-29 DIAGNOSIS — C8470 Anaplastic large cell lymphoma, ALK-negative, unspecified site: Secondary | ICD-10-CM | POA: Insufficient documentation

## 2021-07-29 DIAGNOSIS — Z95828 Presence of other vascular implants and grafts: Secondary | ICD-10-CM

## 2021-07-29 DIAGNOSIS — C8442 Peripheral T-cell lymphoma, not classified, intrathoracic lymph nodes: Secondary | ICD-10-CM

## 2021-07-29 DIAGNOSIS — Z87891 Personal history of nicotine dependence: Secondary | ICD-10-CM | POA: Insufficient documentation

## 2021-07-29 DIAGNOSIS — C8460 Anaplastic large cell lymphoma, ALK-positive, unspecified site: Secondary | ICD-10-CM

## 2021-07-29 DIAGNOSIS — F1290 Cannabis use, unspecified, uncomplicated: Secondary | ICD-10-CM | POA: Insufficient documentation

## 2021-07-29 DIAGNOSIS — Z79899 Other long term (current) drug therapy: Secondary | ICD-10-CM | POA: Insufficient documentation

## 2021-07-29 LAB — CBC WITH DIFFERENTIAL (CANCER CENTER ONLY)
Abs Immature Granulocytes: 0.01 10*3/uL (ref 0.00–0.07)
Basophils Absolute: 0.1 10*3/uL (ref 0.0–0.1)
Basophils Relative: 1 %
Eosinophils Absolute: 0.3 10*3/uL (ref 0.0–0.5)
Eosinophils Relative: 5 %
HCT: 40.9 % (ref 39.0–52.0)
Hemoglobin: 13.9 g/dL (ref 13.0–17.0)
Immature Granulocytes: 0 %
Lymphocytes Relative: 40 %
Lymphs Abs: 2.6 10*3/uL (ref 0.7–4.0)
MCH: 31.4 pg (ref 26.0–34.0)
MCHC: 34 g/dL (ref 30.0–36.0)
MCV: 92.5 fL (ref 80.0–100.0)
Monocytes Absolute: 0.5 10*3/uL (ref 0.1–1.0)
Monocytes Relative: 8 %
Neutro Abs: 2.9 10*3/uL (ref 1.7–7.7)
Neutrophils Relative %: 46 %
Platelet Count: 269 10*3/uL (ref 150–400)
RBC: 4.42 MIL/uL (ref 4.22–5.81)
RDW: 14.7 % (ref 11.5–15.5)
WBC Count: 6.4 10*3/uL (ref 4.0–10.5)
nRBC: 0 % (ref 0.0–0.2)

## 2021-07-29 LAB — CMP (CANCER CENTER ONLY)
ALT: 10 U/L (ref 0–44)
AST: 14 U/L — ABNORMAL LOW (ref 15–41)
Albumin: 4 g/dL (ref 3.5–5.0)
Alkaline Phosphatase: 59 U/L (ref 38–126)
Anion gap: 5 (ref 5–15)
BUN: 14 mg/dL (ref 6–20)
CO2: 30 mmol/L (ref 22–32)
Calcium: 9.5 mg/dL (ref 8.9–10.3)
Chloride: 106 mmol/L (ref 98–111)
Creatinine: 1.01 mg/dL (ref 0.61–1.24)
GFR, Estimated: >60 mL/min (ref >60)
Glucose, Bld: 107 mg/dL — ABNORMAL HIGH (ref 70–99)
Potassium: 4 mmol/L (ref 3.5–5.1)
Sodium: 141 mmol/L (ref 135–145)
Total Bilirubin: 0.4 mg/dL (ref 0.3–1.2)
Total Protein: 7.3 g/dL (ref 6.5–8.1)

## 2021-07-29 LAB — LACTATE DEHYDROGENASE: LDH: 123 U/L (ref 98–192)

## 2021-07-29 NOTE — Progress Notes (Signed)
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 Previously seen in June 2021 and given course of doxycycline. Infectious workup including RPR, HIV, GC/chlamydia were negative.  2) 09/23/2019: presented to PCP with concern for bilaterally enlarged lymph nodes of the groin. Noted to have scant drainage 3) 10/12/2019: establish care with Dr. 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  5) 02/23/2020: Cycle 1 Day 1 of BV-CHP 6) 03/15/2020: Cycle 2 Day 1 of BV-CHP 7) 04/05/2020: Cycle 3 Day 1 of BV-CHP 8) 04/26/2020: Cycle 4 Day 1 of BV-CHP 9) 05/17/2020:  Cycle 5 Day 1 of BV-CHP 10) 06/07/2020: Cycle 6 Day 1 of BV-CHP 11) 06/29/2020: PET CT scan showed near complete resolution of left axillary metabolic adenopathy as well as marked reduction in the metabolic activity of the inguinal lymph nodes down to Deauville score 3.  Findings are consistent with a Lugano complete metabolic response 12) 01/24/7827: CT scan showed prominent non pathologically enlarged inguinal lymph nodes, without evidence of adenopathy in the chest, abdomen or pelvis. No splenomegaly  Interval History:  Tanner Gallagher 56 y.o. male with medical history significant for Anaplastic T Cell Lymphoma who presents for a follow up visit. The patient's last visit was on 04/29/2021. In the interim since the last visit the patient has had a CT scan which showed no evidence of residual or recurrent disease.  On exam today Tanner Gallagher notes he has been great overall in the interim since her last visit.  He notes his energy is currently a 10 out of 10.  His appetite is quite good.  His weight is been steady in the approximately  the 1 40-1 44 range.  He notes that he is go is to get up to a weight of 1 to 55 pounds.  He has been staying hydrated.  He notes that he does still have some drainage from the abscess on his backside but is not causing him any major difficulties.  He is not having any pain, fevers, chills, sweats, nausea, vomiting or diarrhea.  Overall he feels well and has no questions comments or concerns.  A full 10 point ROS is listed below.    MEDICAL HISTORY:  Past Medical History:  Diagnosis Date   Cancer [facility])    lymph nodes   Chronic lower back pain 15 years   Medical history non-contributory    Tooth pain    no abscess or drainage per pt 02-01-2020    SURGICAL HISTORY: Past Surgical History:  Procedure Laterality Date   INCISION AND DRAINAGE PERIRECTAL ABSCESS  06/16/2017   INCISION AND DRAINAGE PERIRECTAL ABSCESS N/A 06/16/2017   Procedure: IRRIGATION AND DEBRIDEMENT PERIRECTAL ABSCESS;  Surgeon: Coralie Keens, MD;  Location: [place] [facility];  Service: General;  Laterality: N/A;   INGUINAL HERNIA REPAIR Right 2000s   IR IMAGING GUIDED PORT INSERTION  02/15/2020   IR REMOVAL TUN ACCESS W/ PORT W/O FL MOD SED  09/14/2020   LYMPH NODE BIOPSY Right 02/02/2020   Procedure: EXCISIONAL INGUINAL LYMPH NODE BIOPSY;  Surgeon: Leighton Ruff, MD;  Location: [facility];  Service: General;  Laterality: Right;   PROCTOSCOPY  06/16/2017   Procedure: RIGID PROCTOSCOPY;  Surgeon: Coralie Keens, MD;  Location: [facility];  Service: General;;    SOCIAL HISTORY: Social History   Socioeconomic History   Marital status: Married    Spouse name: Not on file   Number of children: Not on file   Years of education: Not on file   Highest education level: Not on file  Occupational History   Not on file  Tobacco Use   Smoking status: Former    Packs/day: 0.12    Years: 37.00    Total pack years: 4.44    Types: Cigarettes    Quit date: 05/20/2017    Years since quitting: 4.1   Smokeless tobacco: Never   Vaping Use   Vaping Use: Never used  Substance and Sexual Activity   Alcohol use: Not Currently    Comment: rare   Drug use: Yes    Types: Marijuana    Comment: 02-01-2020 "once a day"; used in last 24 hours   Sexual activity: Yes  Other Topics Concern   Not on file  Social History Narrative   Not on file   Social Determinants of Health   Financial Resource Strain: Not on file  Food Insecurity: Not on file  Transportation Needs: Not on file  Physical Activity: Not on file  Stress: Not on file  Social Connections: Not on file  Intimate Partner Violence: Not on file    FAMILY HISTORY: Family History  Problem Relation Age of Onset   Diabetes Neg Hx     ALLERGIES:  has No Known Allergies.  MEDICATIONS:  No current outpatient medications on file.   No current facility-administered medications for this visit.    REVIEW OF SYSTEMS:   Constitutional: ( - ) fevers, ( - )  chills , ( - ) night sweats Eyes: ( - ) blurriness of vision, ( - ) double vision, ( - ) watery eyes Ears, nose, mouth, throat, and face: ( - ) mucositis, ( - ) sore throat Respiratory: ( - ) cough, ( - ) dyspnea, ( - ) wheezes Cardiovascular: ( - ) palpitation, ( - ) chest discomfort, ( - ) lower extremity swelling Gastrointestinal:  ( - ) nausea, ( - ) heartburn, ( - ) change in bowel habits Skin: ( - ) abnormal skin rashes Lymphatics: ( - ) new lymphadenopathy, ( - ) easy bruising Neurological: ( - ) numbness, ( - ) tingling, ( - ) new weaknesses Behavioral/Psych: ( - ) mood change, ( - ) new changes  All other systems were reviewed with the patient and are negative.  PHYSICAL EXAMINATION: ECOG PERFORMANCE STATUS: 0 - Asymptomatic  Vitals:   07/29/21 1028  BP: 109/75  Pulse: 60  Resp: 16  Temp: 98 F (36.7 C)  SpO2: 100%     Filed Weights   07/29/21 1028  Weight: 142 lb 1.6 oz (64.5 kg)      GENERAL: well appearing middle aged [place] American male. alert, no distress and  comfortable SKIN: skin color, texture, turgor are normal, no rashes or significant lesions EYES: conjunctiva are pink and non-injected, sclera clear LYMPH: No lymphadenopathy palpable in the cervical lymph nodes.  No palpable lymphadenopathy in the groin LUNGS: clear to auscultation and percussion with normal breathing effort HEART: regular rate & rhythm and no murmurs and no lower extremity edema Musculoskeletal: no cyanosis of digits and no clubbing  PSYCH: alert & oriented x 3, fluent speech NEURO: no focal motor/sensory deficits  LABORATORY DATA:  I have reviewed the data as listed  Latest Ref Rng & Units 07/29/2021   10:14 AM 04/29/2021   10:38 AM 01/30/2021    9:40 AM  CBC  WBC 4.0 - 10.5 K/uL 6.4  5.3  4.8   Hemoglobin 13.0 - 17.0 g/dL 13.9  13.3  13.4   Hematocrit 39.0 - 52.0 % 40.9  39.8  39.2   Platelets 150 - 400 K/uL 269  216  278        Latest Ref Rng & Units 07/29/2021   10:14 AM 04/29/2021   10:38 AM 01/30/2021    9:40 AM  CMP  Glucose 70 - 99 mg/dL 107  108  130   BUN 6 - 20 mg/dL _0 Creatinine 0.61 - 1.24 mg/dL 1.01  0.98  0.95   Sodium 135 - 145 mmol/L 141  140  138   Potassium 3.5 - 5.1 mmol/L 4.0  4.7  4.6   Chloride 98 - 111 mmol/L 106  107  103   CO2 22 - 32 mmol/L _1 Calcium 8.9 - 10.3 mg/dL 9.5  9.4  9.5   Total Protein 6.5 - 8.1 g/dL 7.3  7.4  7.5   Total Bilirubin 0.3 - 1.2 mg/dL 0.4  0.4  0.5   Alkaline Phos 38 - 126 U/L 59  63  54   AST 15 - 41 U/L _2 ALT 0 - 44 U/L _3 RADIOGRAPHIC STUDIES: No results found.  ASSESSMENT & PLAN Tanner Gallagher 56 y.o. male with no significant medical history presents for evaluation of Anaplastic T Cell Lymphoma.  Review the labs, review the records, discussion with the patient the findings are most consistent with Anaplastic T Cell Lymphoma.     PET CT scan performed on 02/24/2020 confirmed Stage III disease with involvement of the axillary and inguinal lymph nodes. He has  also had a port placed and echocardiogram performed. Post treatment PET CT scan on 06/29/2020 showed Deauville 3 inguinal lymph nodes, returned to normal size. Near complete resolution of axillary adenopathy. CT scan on 01/09/2021 showed stable examination including prominent inguinal lymph nodes without pathologically enlarged lymph nodes within the chest, abdomen, or pelvis and no splenomegaly.  The treatment of choice for this patient's cancer would be BV-CHP chemotherapy.  This regimen consists of doxorubicin 50 mg per metered squared on day 1, cyclophosphamide 750 mg per metered squared on day 1, and brentuximab 1.8 mg/kg on day 1 of a 21-day cycle.  The patient also received prednisone 36m PO on days 1-5.  He completed 6 cycles.   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  --port had been placed, TTE shows adequate heart function for anthracycline therapy.  --initial PET CT scan shows involvement of the inguinal/axillary lymph nodes. Findings consistent with Stage III disease.  --patient completed 6 cycles of  BV-CHP chemotherapy as noted above. Cycle 1 Day 1 of treatment started on 02/23/2020.  --post treatment PET CT scan on 06/29/2020 showed Deauville 3 inguinal lymph nodes, returned to normal size. Near complete resolution of axillary adenopathy.  Plan: --Per NCCN recommendations, will follow the patient q 3 months with labs and q 6 months for CT C/A/P x 2 years. Next CT scan due in Dec 2023.  --labs today show white blood cell count 6.4, hemoglobin 13.9, MCV  92.5, and platelets of 269 --RTC in 3 months for continue monitoring.   #Chemotherapy Induced Anemia-resolved --Hgb 13.9, trending upward --continue to monitor   No orders of the defined types were placed in this encounter.   All questions were answered. The patient knows to call the clinic with any problems, questions or concerns.  A total of  more than 25 minutes were spent on this encounter and over half of that time was spent on counseling and coordination of care as outlined above.   Ledell Peoples, MD Department of Hematology/Oncology [place] at [facility] Phone: [phone] Pager: [phone] Email: [email or web address]  07/29/2021 11:05 AM   Literature Support:  Lynden Ang OA, Pro B, [place], [place], [place], [place], [place], Morschhauser F, Domingo-Domenech E, Margot Chimes [address] D, [place] , [place], [place], [place] SP, Shustov A, Httmann A, [place], Yuen S, Iyer S, Zinzani PL, Hua Z, Little M, Romelle Starcher T, Trmper L; [place] Study Group. 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 Lancet. 2019 Jan 19;393(10168):229-240.   --Median progression-free survival was 482 months (95% CI 352-not evaluable) in the A+CHP group and 208 months (127-476) in the CHOP group (hazard ratio 071 [95% CI 054-093], p=[id number]).   --Front-line treatment with A+CHP is superior to CHOP for patients with CD30-positive peripheral T-cell lymphomas as shown by a significant improvement in progression-free survival and overall survival with a manageable safety profile.

## 2021-07-30 ENCOUNTER — Telehealth: Payer: Self-pay | Admitting: Hematology and Oncology

## 2021-07-30 ENCOUNTER — Encounter: Payer: Self-pay | Admitting: Dietician

## 2021-07-30 NOTE — Telephone Encounter (Signed)
Scheduled per 7/10 los, message has been left with pt

## 2021-07-30 NOTE — Progress Notes (Signed)
Provided one complimentary case of Ensure Plus High Protein 

## 2021-08-12 ENCOUNTER — Other Ambulatory Visit: Payer: Self-pay

## 2021-10-28 ENCOUNTER — Inpatient Hospital Stay (HOSPITAL_BASED_OUTPATIENT_CLINIC_OR_DEPARTMENT_OTHER): Payer: Self-pay | Admitting: Hematology and Oncology

## 2021-10-28 ENCOUNTER — Inpatient Hospital Stay: Payer: Self-pay | Attending: Hematology and Oncology

## 2021-10-28 ENCOUNTER — Other Ambulatory Visit: Payer: Self-pay | Admitting: Hematology and Oncology

## 2021-10-28 VITALS — BP 110/76 | HR 74 | Temp 97.9°F | Resp 18 | Wt 140.2 lb

## 2021-10-28 DIAGNOSIS — C8460 Anaplastic large cell lymphoma, ALK-positive, unspecified site: Secondary | ICD-10-CM

## 2021-10-28 DIAGNOSIS — C8475 Anaplastic large cell lymphoma, ALK-negative, lymph nodes of inguinal region and lower limb: Secondary | ICD-10-CM | POA: Insufficient documentation

## 2021-10-28 DIAGNOSIS — C8442 Peripheral T-cell lymphoma, not classified, intrathoracic lymph nodes: Secondary | ICD-10-CM

## 2021-10-28 DIAGNOSIS — Z95828 Presence of other vascular implants and grafts: Secondary | ICD-10-CM

## 2021-10-28 DIAGNOSIS — Z87891 Personal history of nicotine dependence: Secondary | ICD-10-CM | POA: Insufficient documentation

## 2021-10-28 LAB — CMP (CANCER CENTER ONLY)
ALT: 11 U/L (ref 0–44)
AST: 14 U/L — ABNORMAL LOW (ref 15–41)
Albumin: 4.3 g/dL (ref 3.5–5.0)
Alkaline Phosphatase: 61 U/L (ref 38–126)
Anion gap: 5 (ref 5–15)
BUN: 16 mg/dL (ref 6–20)
CO2: 28 mmol/L (ref 22–32)
Calcium: 9.2 mg/dL (ref 8.9–10.3)
Chloride: 105 mmol/L (ref 98–111)
Creatinine: 0.88 mg/dL (ref 0.61–1.24)
GFR, Estimated: >60 mL/min (ref >60)
Glucose, Bld: 104 mg/dL — ABNORMAL HIGH (ref 70–99)
Potassium: 4 mmol/L (ref 3.5–5.1)
Sodium: 138 mmol/L (ref 135–145)
Total Bilirubin: 0.5 mg/dL (ref 0.3–1.2)
Total Protein: 7.7 g/dL (ref 6.5–8.1)

## 2021-10-28 LAB — CBC WITH DIFFERENTIAL (CANCER CENTER ONLY)
Abs Immature Granulocytes: 0 10*3/uL (ref 0.00–0.07)
Basophils Absolute: 0.1 10*3/uL (ref 0.0–0.1)
Basophils Relative: 1 %
Eosinophils Absolute: 0.3 10*3/uL (ref 0.0–0.5)
Eosinophils Relative: 7 %
HCT: 41.6 % (ref 39.0–52.0)
Hemoglobin: 14.3 g/dL (ref 13.0–17.0)
Immature Granulocytes: 0 %
Lymphocytes Relative: 55 %
Lymphs Abs: 2.7 10*3/uL (ref 0.7–4.0)
MCH: 31.6 pg (ref 26.0–34.0)
MCHC: 34.4 g/dL (ref 30.0–36.0)
MCV: 91.8 fL (ref 80.0–100.0)
Monocytes Absolute: 0.4 10*3/uL (ref 0.1–1.0)
Monocytes Relative: 7 %
Neutro Abs: 1.5 10*3/uL — ABNORMAL LOW (ref 1.7–7.7)
Neutrophils Relative %: 30 %
Platelet Count: 239 10*3/uL (ref 150–400)
RBC: 4.53 MIL/uL (ref 4.22–5.81)
RDW: 14.6 % (ref 11.5–15.5)
WBC Count: 4.9 10*3/uL (ref 4.0–10.5)
nRBC: 0 % (ref 0.0–0.2)

## 2021-10-28 LAB — LACTATE DEHYDROGENASE: LDH: 132 U/L (ref 98–192)

## 2021-10-28 NOTE — Progress Notes (Unsigned)
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 Previously seen in June 2021 and given course of doxycycline. Infectious workup including RPR, HIV, GC/chlamydia were negative.  2) 09/23/2019: presented to PCP with concern for bilaterally enlarged lymph nodes of the groin. Noted to have scant drainage 3) 10/12/2019: establish care with Dr. 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  5) 02/23/2020: Cycle 1 Day 1 of BV-CHP 6) 03/15/2020: Cycle 2 Day 1 of BV-CHP 7) 04/05/2020: Cycle 3 Day 1 of BV-CHP 8) 04/26/2020: Cycle 4 Day 1 of BV-CHP 9) 05/17/2020:  Cycle 5 Day 1 of BV-CHP 10) 06/07/2020: Cycle 6 Day 1 of BV-CHP 11) 06/29/2020: PET CT scan showed near complete resolution of left axillary metabolic adenopathy as well as marked reduction in the metabolic activity of the inguinal lymph nodes down to Deauville score 3.  Findings are consistent with a Lug[place] complete metabolic response 12) 4/0/0867: CT scan showed prominent non pathologically enlarged inguinal lymph nodes, without evidence of adenopathy in the chest, abdomen or pelvis. No splenomegaly  Interval History:  Tanner Gallagher 56 y.o. male with medical history significant for Anaplastic T Cell Lymphoma who presents for a follow up visit. The patient's last visit was on 04/29/2021. In the interim since the last visit the patient has had a CT scan which showed no evidence of residual or recurrent disease.  On exam today Tanner Gallagher notes his health has been stable in the interim since his last visit.  He reports his energy levels are good and his appetite is strong.  He does continue to drink Ensure because his weight has  been steadily declining.  He is down to 140 pounds from 144 pounds in April.  He notes he is not having any overt signs of bleeding, bruising, or dark stools.  He notes the lymph nodes in his groin are of normal size and have not changed.  He reports that the abscess on his backside is also not currently any drainage or issues.  He is not having any systemic symptoms as result of this.  He is not having any pain, fevers, chills, sweats, nausea, vomiting or diarrhea.  Overall he feels well and has no questions comments or concerns.  A full 10 point ROS is listed below.    MEDICAL HISTORY:  Past Medical History:  Diagnosis Date   Cancer [facility])    lymph nodes   Chronic lower back pain 15 years   Medical history non-contributory    Tooth pain    no abscess or drainage per pt 02-01-2020    SURGICAL HISTORY: Past Surgical History:  Procedure Laterality Date   INCISION AND DRAINAGE PERIRECTAL ABSCESS  06/16/2017   INCISION AND DRAINAGE PERIRECTAL ABSCESS N/A 06/16/2017   Procedure: IRRIGATION AND DEBRIDEMENT PERIRECTAL ABSCESS;  Surgeon: Coralie Keens, MD;  Location: [facility];  Service: General;  Laterality: N/A;   INGUINAL HERNIA REPAIR Right 2000s   IR IMAGING GUIDED PORT INSERTION  02/15/2020   IR REMOVAL TUN ACCESS W/ PORT W/O FL MOD SED  09/14/2020   LYMPH NODE BIOPSY Right 02/02/2020   Procedure: EXCISIONAL INGUINAL LYMPH NODE BIOPSY;  Surgeon: Leighton Ruff, MD;  Location: [facility];  Service:  General;  Laterality: Right;   PROCTOSCOPY  06/16/2017   Procedure: RIGID PROCTOSCOPY;  Surgeon: Coralie Keens, MD;  Location: [place];  Service: General;;    SOCIAL HISTORY: Social History   Socioeconomic History   Marital status: Married    Spouse name: Not on file   Number of children: Not on file   Years of education: Not on file   Highest education level: Not on file  Occupational History   Not on file  Tobacco Use   Smoking status: Former    Packs/day: 0.12     Years: 37.00    Total pack years: 4.44    Types: Cigarettes    Quit date: 05/20/2017    Years since quitting: 4.4   Smokeless tobacco: Never  Vaping Use   Vaping Use: Never used  Substance and Sexual Activity   Alcohol use: Not Currently    Comment: rare   Drug use: Yes    Types: Marijuana    Comment: 02-01-2020 "once a day"; used in last 24 hours   Sexual activity: Yes  Other Topics Concern   Not on file  Social History Narrative   Not on file   Social Determinants of Health   Financial Resource Strain: Not on file  Food Insecurity: Not on file  Transportation Needs: Not on file  Physical Activity: Not on file  Stress: Not on file  Social Connections: Not on file  Intimate Partner Violence: Not on file    FAMILY HISTORY: Family History  Problem Relation Age of Onset   Diabetes Neg Hx     ALLERGIES:  has No Known Allergies.  MEDICATIONS:  No current outpatient medications on file.   No current facility-administered medications for this visit.    REVIEW OF SYSTEMS:   Constitutional: ( - ) fevers, ( - )  chills , ( - ) night sweats Eyes: ( - ) blurriness of vision, ( - ) double vision, ( - ) watery eyes Ears, nose, mouth, throat, and face: ( - ) mucositis, ( - ) sore throat Respiratory: ( - ) cough, ( - ) dyspnea, ( - ) wheezes Cardiovascular: ( - ) palpitation, ( - ) chest discomfort, ( - ) lower extremity swelling Gastrointestinal:  ( - ) nausea, ( - ) heartburn, ( - ) change in bowel habits Skin: ( - ) abnormal skin rashes Lymphatics: ( - ) new lymphadenopathy, ( - ) easy bruising Neurological: ( - ) numbness, ( - ) tingling, ( - ) new weaknesses Behavioral/Psych: ( - ) mood change, ( - ) new changes  All other systems were reviewed with the patient and are negative.  PHYSICAL EXAMINATION: ECOG PERFORMANCE STATUS: 0 - Asymptomatic  Vitals:   10/28/21 1032  BP: 110/76  Pulse: 74  Resp: 18  Temp: 97.9 F (36.6 C)  SpO2: 100%     Filed Weights    10/28/21 1032  Weight: 140 lb 4 oz (63.6 kg)      GENERAL: well appearing middle aged [place] American male. alert, no distress and comfortable SKIN: skin color, texture, turgor are normal, no rashes or significant lesions EYES: conjunctiva are pink and non-injected, sclera clear LYMPH: No lymphadenopathy palpable in the cervical lymph nodes.  No palpable lymphadenopathy in the groin LUNGS: clear to auscultation and percussion with normal breathing effort HEART: regular rate & rhythm and no murmurs and no lower extremity edema Musculoskeletal: no cyanosis of digits and no clubbing  PSYCH: alert & oriented x 3, fluent  speech NEURO: no focal motor/sensory deficits  LABORATORY DATA:  I have reviewed the data as listed    Latest Ref Rng & Units 10/28/2021    9:52 AM 07/29/2021   10:14 AM 04/29/2021   10:38 AM  CBC  WBC 4.0 - 10.5 K/uL 4.9  6.4  5.3   Hemoglobin 13.0 - 17.0 g/dL 14.3  13.9  13.3   Hematocrit 39.0 - 52.0 % 41.6  40.9  39.8   Platelets 150 - 400 K/uL 239  269  216        Latest Ref Rng & Units 10/28/2021    9:52 AM 07/29/2021   10:14 AM 04/29/2021   10:38 AM  CMP  Glucose 70 - 99 mg/dL 104  107  108   BUN 6 - 20 mg/dL 16  14  17    Creatinine 0.61 - 1.24 mg/dL 0.88  1.01  0.98   Sodium 135 - 145 mmol/L 138  141  140   Potassium 3.5 - 5.1 mmol/L 4.0  4.0  4.7   Chloride 98 - 111 mmol/L 105  106  107   CO2 22 - 32 mmol/L 28  30  30    Calcium 8.9 - 10.3 mg/dL 9.2  9.5  9.4   Total Protein 6.5 - 8.1 g/dL 7.7  7.3  7.4   Total Bilirubin 0.3 - 1.2 mg/dL 0.5  0.4  0.4   Alkaline Phos 38 - 126 U/L 61  59  63   AST 15 - 41 U/L 14  14  16    ALT 0 - 44 U/L 11  10  13      RADIOGRAPHIC STUDIES: No results found.  ASSESSMENT & PLAN Tanner Gallagher 56 y.o. male with no significant medical history presents for evaluation of Anaplastic T Cell Lymphoma.  Review the labs, review the records, discussion with the patient the findings are most consistent with Anaplastic T Cell  Lymphoma.     PET CT scan performed on 02/24/2020 confirmed Stage III disease with involvement of the axillary and inguinal lymph nodes. He has also had a port placed and echocardiogram performed. Post treatment PET CT scan on 06/29/2020 showed Deauville 3 inguinal lymph nodes, returned to normal size. Near complete resolution of axillary adenopathy. CT scan on 01/09/2021 showed stable examination including prominent inguinal lymph nodes without pathologically enlarged lymph nodes within the chest, abdomen, or pelvis and no splenomegaly.  The treatment of choice for this patient's cancer would be BV-CHP chemotherapy.  This regimen consists of doxorubicin 50 mg per metered squared on day 1, cyclophosphamide 750 mg per metered squared on day 1, and brentuximab 1.8 mg/kg on day 1 of a 21-day cycle.  The patient also received prednisone 60mg  PO on days 1-5.  He completed 6 cycles.   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  --port had been placed, TTE shows adequate heart function for anthracycline therapy.  --initial PET CT scan shows involvement of the inguinal/axillary lymph nodes. Findings consistent with Stage III disease.  --patient completed 6 cycles of  BV-CHP chemotherapy as noted above. Cycle 1 Day 1 of treatment started on 02/23/2020.  --post treatment PET CT scan on 06/29/2020 showed Deauville 3 inguinal lymph nodes, returned to normal size. Near complete resolution of axillary adenopathy.  Plan: --Per NCCN recommendations, will follow the patient q 3 months with labs and q 6 months for CT C/A/P x 2  years. Next CT scan due in Dec 2023.  --labs today show white blood cell count 4.9, hemoglobin 14.3, MCV 91.8, and platelets of 239 --RTC in 3 months for continue monitoring.    No orders of the defined types were placed in this encounter.   All questions were answered. The patient knows to call the clinic with  any problems, questions or concerns.  A total of more than 25 minutes were spent on this encounter and over half of that time was spent on counseling and coordination of care as outlined above.   Ledell Peoples, MD Department of Hematology/Oncology [place] at [facility] Phone: [phone] Pager: [phone] Email: [email or web address]  10/28/2021 10:40 AM   Literature Support:  Lynden Ang OA, Pro B, [place], [place], [place], [place], [place], Morschhauser F, Domingo-Domenech E, Margot Chimes [address] D, [place] , [place], [place], [place] SP, Shustov A, Httmann A, [place], Yuen S, Iyer S, Zinzani PL, Hua Z, [place] M, Romelle Starcher T, Trmper L; [place] Study Group. 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 Lancet. 2019 Jan 19;393(10168):229-240.   --Median progression-free survival was 482 months (95% CI 352-not evaluable) in the A+CHP group and 208 months (127-476) in the CHOP group (hazard ratio 071 [95% CI 054-093], p=[id number]).   --Front-line treatment with A+CHP is superior to CHOP for patients with CD30-positive peripheral T-cell lymphomas as shown by a significant improvement in progression-free survival and overall survival with a manageable safety profile.

## 2021-10-29 ENCOUNTER — Encounter: Payer: Self-pay | Admitting: Hematology and Oncology

## 2022-01-09 IMAGING — CT CT HEAD W/O CM
3 series · 15 of 47 positions shown, 18 images · non-contrast
Comparison: None.

CLINICAL DATA: Headache 3 days

EXAM:
CT HEAD WITHOUT CONTRAST
TECHNIQUE: Contiguous axial images were obtained from the base of the skull
through the vertex without intravenous contrast.

[Series 2: head wo · axial · 0.45mm/px · z∈[+1506,+1646]mm · 9 of 34 slices shown, 12 images]
[im 3/34  brain]
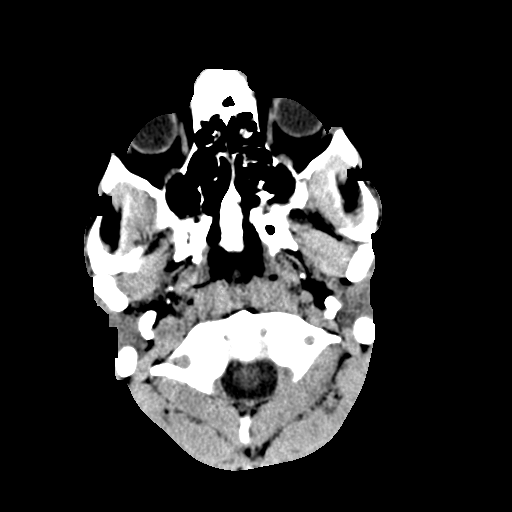
[im 3/34  bone]
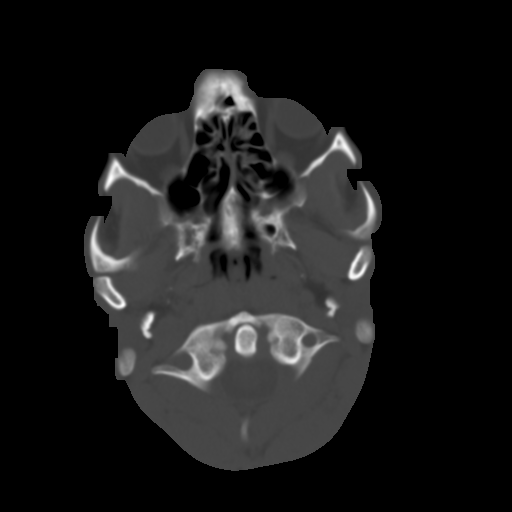
[im 6/34  brain]
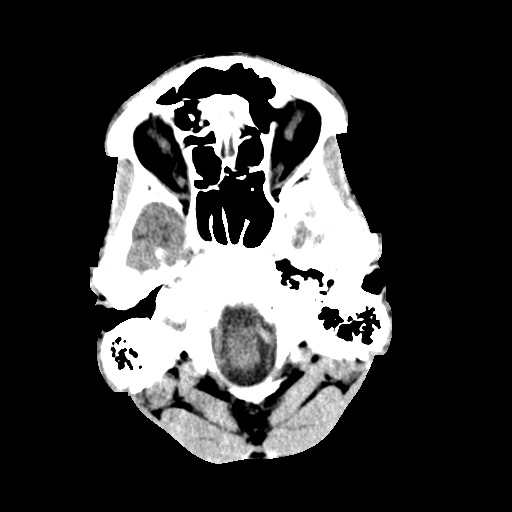
[im 10/34  brain]
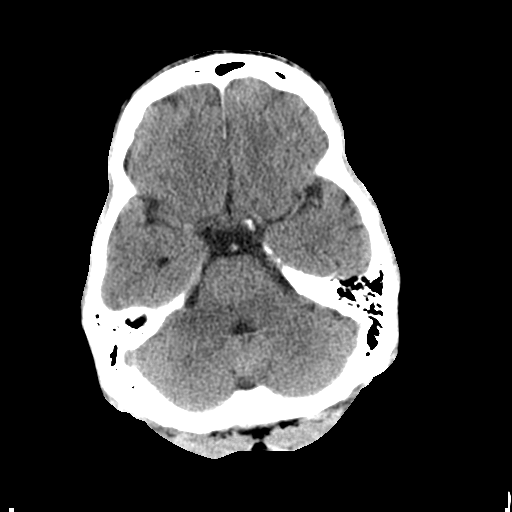
[im 13/34  brain]
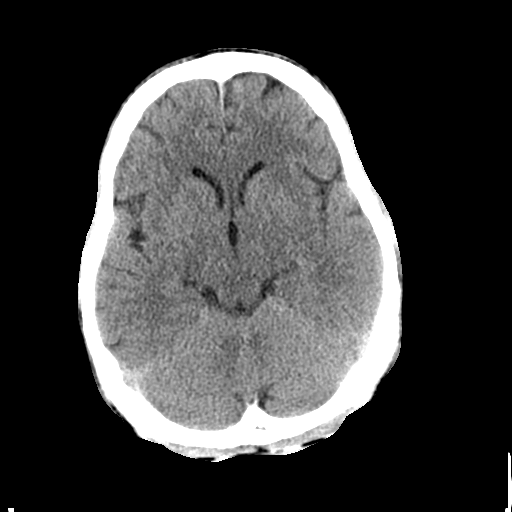
[im 18/34  brain]
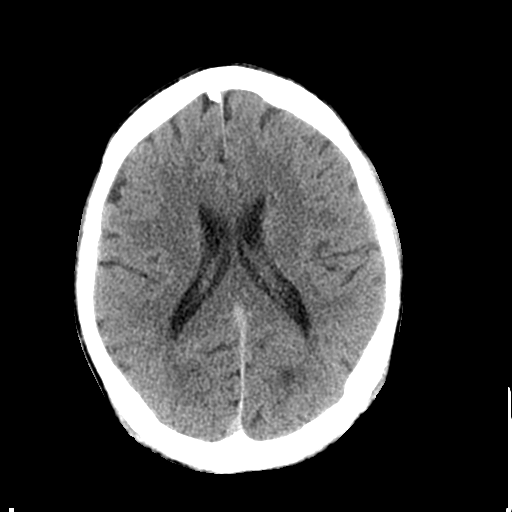
[im 18/34  bone]
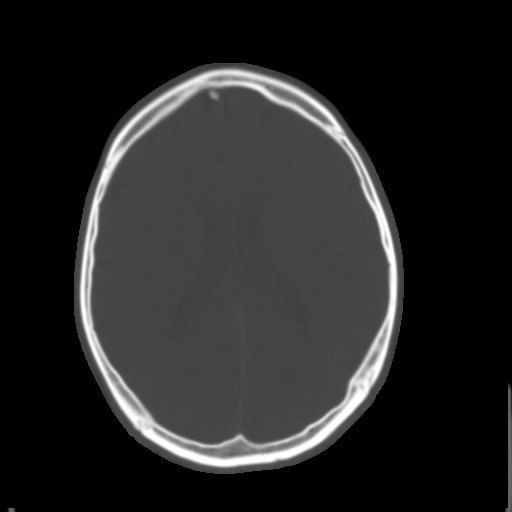
[im 21/34  brain]
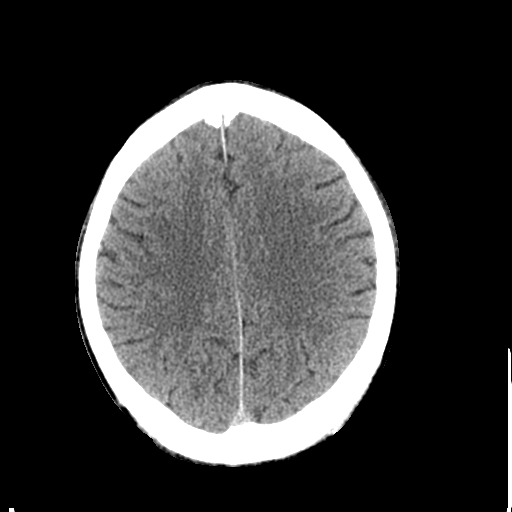
[im 24/34  brain]
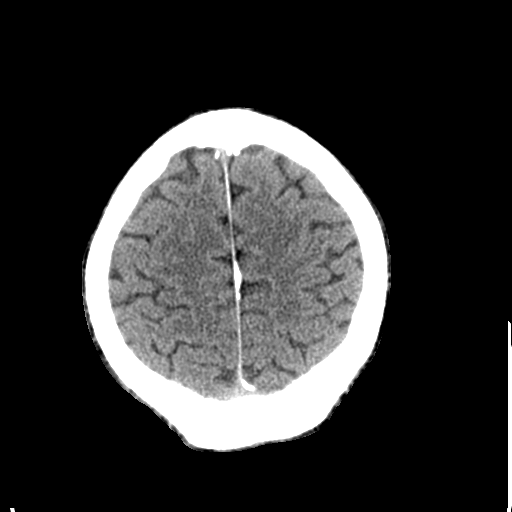
[im 28/34  brain]
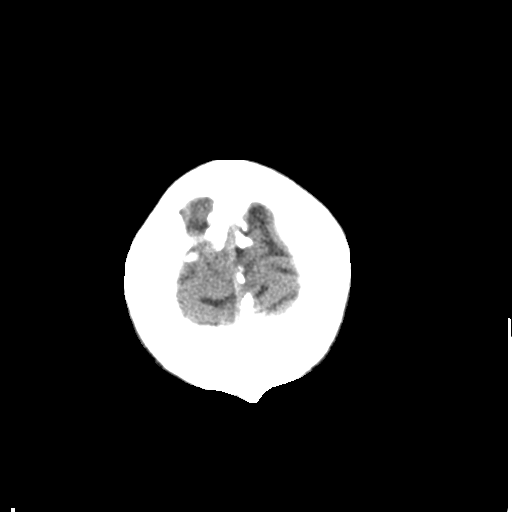
[im 31/34  brain]
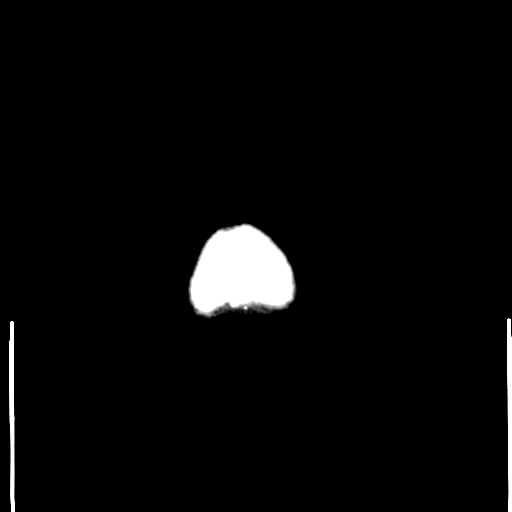
[im 31/34  bone]
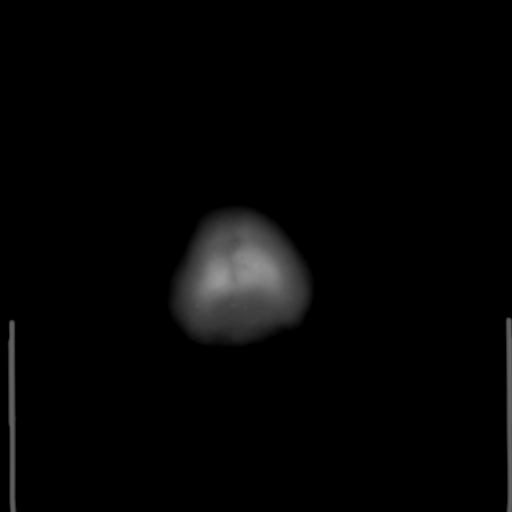

[Series 4: coronal soft tissue · coronal · 0.34mm/px · 3 of 73 slices shown]
[im 25/73  brain]
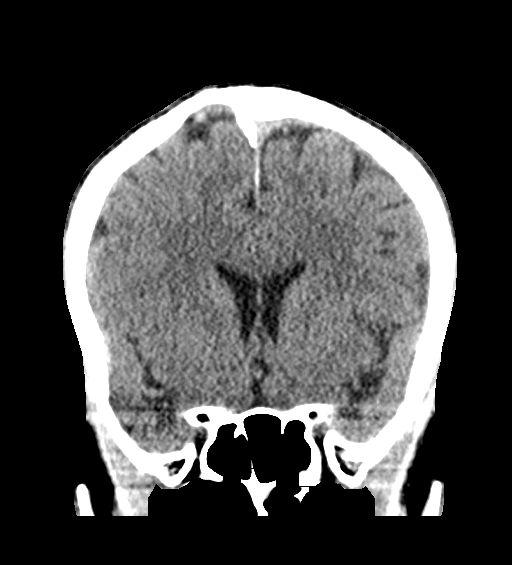
[im 33/73  brain]
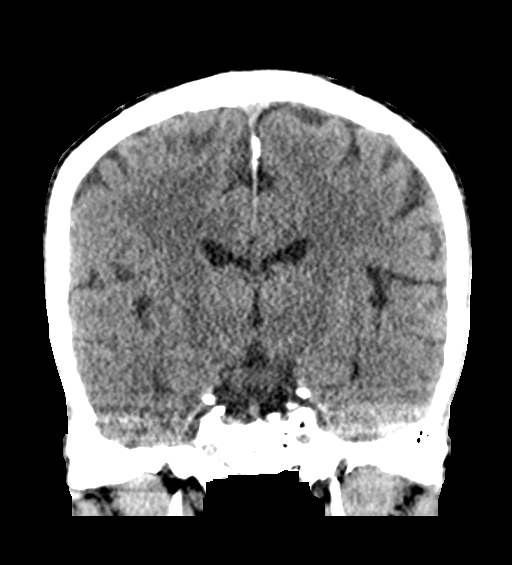
[im 41/73  brain]
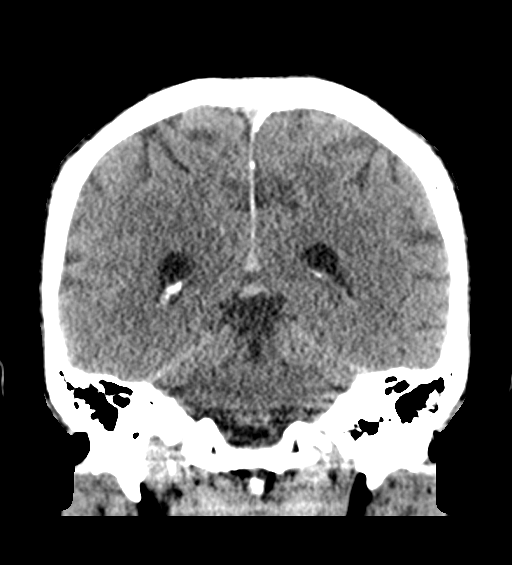

[Series 5: sagittal soft tissue · sagittal · 0.34mm/px · 3 of 66 slices shown]
[im 22/66  brain]
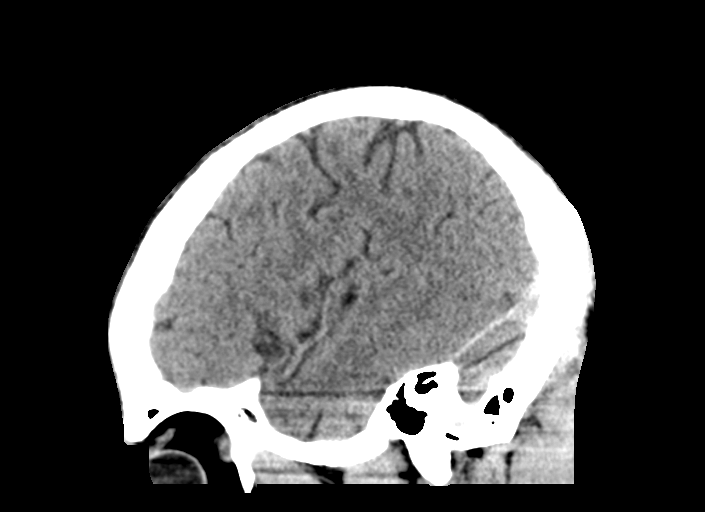
[im 33/66  brain]
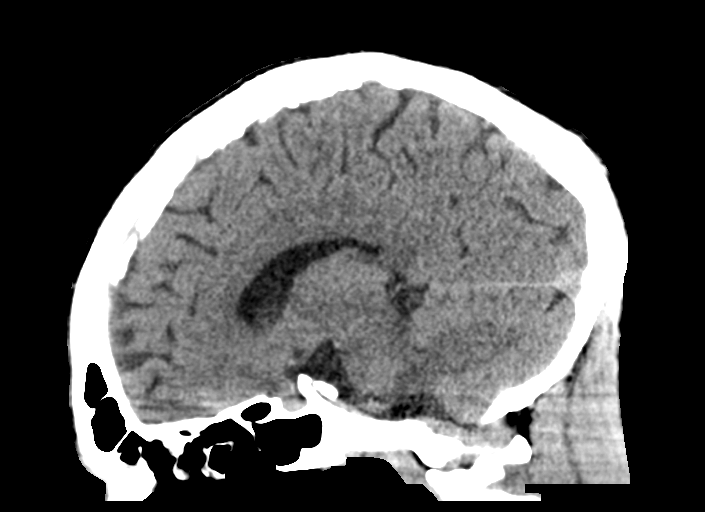
[im 44/66  brain]
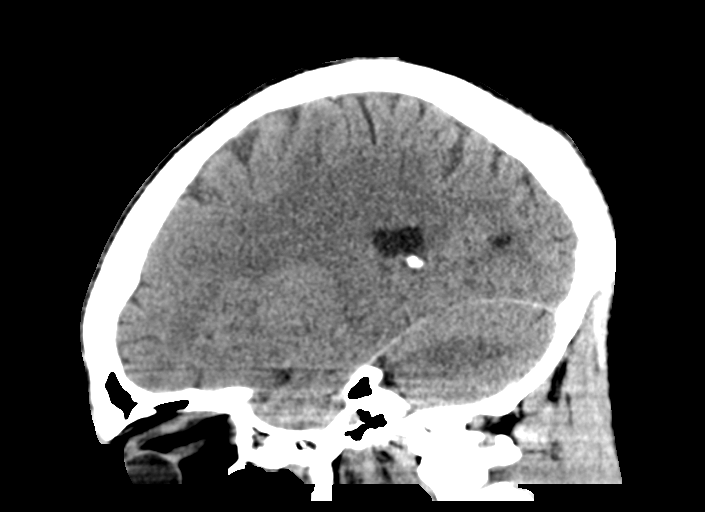

[15 of 47 positions shown; findings below may reference images not displayed]

FINDINGS: Brain: No evidence of acute infarction, hemorrhage, hydrocephalus,
extra-axial collection or mass lesion/mass effect.

Vascular: No hyperdense vessel or unexpected calcification.

Skull: Normal. Negative for fracture or focal lesion.

Sinuses/Orbits: Mucosal thickening in the ethmoid sinuses

Other: None
IMPRESSION: 1. No CT evidence for acute intracranial abnormality.
2. Mild sinus mucosal disease.

## 2022-01-16 ENCOUNTER — Ambulatory Visit (HOSPITAL_COMMUNITY)
Admission: RE | Admit: 2022-01-16 | Discharge: 2022-01-16 | Disposition: A | Discharge status: Home / Self Care | Payer: Medicaid Other | Source: Ambulatory Visit | Attending: Hematology and Oncology | Admitting: Hematology and Oncology

## 2022-01-16 ENCOUNTER — Telehealth: Payer: Self-pay | Admitting: *Deleted

## 2022-01-16 DIAGNOSIS — C8442 Peripheral T-cell lymphoma, not classified, intrathoracic lymph nodes: Secondary | ICD-10-CM | POA: Insufficient documentation

## 2022-01-16 MED ORDER — IOHEXOL 300 MG/ML  SOLN
100.0000 mL | Freq: Once | INTRAMUSCULAR | Status: AC | PRN
  Administered 2022-01-16: 100 mL via INTRAVENOUS

## 2022-01-16 MED ORDER — SODIUM CHLORIDE (PF) 0.9 % IJ SOLN
INTRAMUSCULAR | Status: AC
  Filled 2022-01-16: qty 50

## 2022-01-16 NOTE — Telephone Encounter (Signed)
TCT patient regarding recent scan results. Spoke with him  Advised that his CT scan showed no evidence of residual or recurrent disease. Will plan to see him back as scheduled in January 2024. Pt pleased with this and is aware of his appt in !/2024

## 2022-01-16 NOTE — Telephone Encounter (Signed)
-----   Message from Orson Slick, MD sent at 01/16/2022  1:29 PM EST ----- Please let Mr. Fordham know that his CT scan showed no evidence of residual or recurrent disease.  Will plan to see him back as scheduled in January 2024. ----- Message ----- From: Buel Ream, Rad Results In Sent: 01/16/2022  11:32 AM EST To: Orson Slick, MD

## 2022-01-27 ENCOUNTER — Inpatient Hospital Stay (HOSPITAL_BASED_OUTPATIENT_CLINIC_OR_DEPARTMENT_OTHER): Payer: Medicaid Other | Admitting: Hematology and Oncology

## 2022-01-27 ENCOUNTER — Other Ambulatory Visit: Payer: Self-pay | Admitting: Hematology and Oncology

## 2022-01-27 ENCOUNTER — Inpatient Hospital Stay: Payer: Medicaid Other | Attending: Hematology and Oncology

## 2022-01-27 VITALS — BP 106/71 | HR 83 | Temp 98.0°F | Resp 16 | Wt 141.1 lb

## 2022-01-27 DIAGNOSIS — R590 Localized enlarged lymph nodes: Secondary | ICD-10-CM | POA: Diagnosis not present

## 2022-01-27 DIAGNOSIS — C8442 Peripheral T-cell lymphoma, not classified, intrathoracic lymph nodes: Secondary | ICD-10-CM

## 2022-01-27 DIAGNOSIS — C8475 Anaplastic large cell lymphoma, ALK-negative, lymph nodes of inguinal region and lower limb: Secondary | ICD-10-CM | POA: Insufficient documentation

## 2022-01-27 DIAGNOSIS — C8460 Anaplastic large cell lymphoma, ALK-positive, unspecified site: Secondary | ICD-10-CM

## 2022-01-27 DIAGNOSIS — Z87891 Personal history of nicotine dependence: Secondary | ICD-10-CM | POA: Insufficient documentation

## 2022-01-27 LAB — CMP (CANCER CENTER ONLY)
ALT: 10 U/L (ref 0–44)
AST: 16 U/L (ref 15–41)
Albumin: 4.2 g/dL (ref 3.5–5.0)
Alkaline Phosphatase: 61 U/L (ref 38–126)
Anion gap: 6 (ref 5–15)
BUN: 13 mg/dL (ref 6–20)
CO2: 28 mmol/L (ref 22–32)
Calcium: 9.6 mg/dL (ref 8.9–10.3)
Chloride: 105 mmol/L (ref 98–111)
Creatinine: 0.94 mg/dL (ref 0.61–1.24)
GFR, Estimated: >60 mL/min (ref >60)
Glucose, Bld: 92 mg/dL (ref 70–99)
Potassium: 4.1 mmol/L (ref 3.5–5.1)
Sodium: 139 mmol/L (ref 135–145)
Total Bilirubin: 0.5 mg/dL (ref 0.3–1.2)
Total Protein: 7.1 g/dL (ref 6.5–8.1)

## 2022-01-27 LAB — CBC WITH DIFFERENTIAL (CANCER CENTER ONLY)
Abs Immature Granulocytes: 0.01 10*3/uL (ref 0.00–0.07)
Basophils Absolute: 0.1 10*3/uL (ref 0.0–0.1)
Basophils Relative: 1 %
Eosinophils Absolute: 0.4 10*3/uL (ref 0.0–0.5)
Eosinophils Relative: 8 %
HCT: 40.4 % (ref 39.0–52.0)
Hemoglobin: 13.9 g/dL (ref 13.0–17.0)
Immature Granulocytes: 0 %
Lymphocytes Relative: 56 %
Lymphs Abs: 2.9 10*3/uL (ref 0.7–4.0)
MCH: 31.8 pg (ref 26.0–34.0)
MCHC: 34.4 g/dL (ref 30.0–36.0)
MCV: 92.4 fL (ref 80.0–100.0)
Monocytes Absolute: 0.4 10*3/uL (ref 0.1–1.0)
Monocytes Relative: 7 %
Neutro Abs: 1.4 10*3/uL — ABNORMAL LOW (ref 1.7–7.7)
Neutrophils Relative %: 28 %
Platelet Count: 253 10*3/uL (ref 150–400)
RBC: 4.37 MIL/uL (ref 4.22–5.81)
RDW: 14.3 % (ref 11.5–15.5)
WBC Count: 5.2 10*3/uL (ref 4.0–10.5)
nRBC: 0 % (ref 0.0–0.2)

## 2022-01-27 LAB — LACTATE DEHYDROGENASE: LDH: 119 U/L (ref 98–192)

## 2022-01-27 NOTE — Progress Notes (Signed)
Nutrition  Patient here for follow-up with MD.  Requesting case of ensure.  Complimentary case left at front desk for patient as waiting to see MD.  He will pick up on way out of cancer center today.   Tanner Lindahl [name], [place], [place] Registered Dietitian [phone]

## 2022-01-27 NOTE — Progress Notes (Signed)
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 Previously seen in June 2021 and given course of doxycycline. Infectious workup including RPR, HIV, GC/chlamydia were negative.  2) 09/23/2019: presented to PCP with concern for bilaterally enlarged lymph nodes of the groin. Noted to have scant drainage 3) 10/12/2019: establish care with Dr. 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  5) 02/23/2020: Cycle 1 Day 1 of BV-CHP 6) 03/15/2020: Cycle 2 Day 1 of BV-CHP 7) 04/05/2020: Cycle 3 Day 1 of BV-CHP 8) 04/26/2020: Cycle 4 Day 1 of BV-CHP 9) 05/17/2020:  Cycle 5 Day 1 of BV-CHP 10) 06/07/2020: Cycle 6 Day 1 of BV-CHP 11) 06/29/2020: PET CT scan showed near complete resolution of left axillary metabolic adenopathy as well as marked reduction in the metabolic activity of the inguinal lymph nodes down to Deauville score 3.  Findings are consistent with a [place] complete metabolic response 12) 05/23/6642: CT scan showed prominent non pathologically enlarged inguinal lymph nodes, without evidence of adenopathy in the chest, abdomen or pelvis. No splenomegaly  Interval History:  Tanner Gallagher 57 y.o. male with medical history significant for Anaplastic T Cell Lymphoma who presents for a follow up visit. The patient's last visit was on 10/28/2021. In the interim since the last visit the patient has had a CT scan on 01/16/2022 which showed no evidence of residual or recurrent disease.  On exam today Tanner Gallagher notes he has been well overall in the interim since her last visit.  He reports his energy levels are strong and he is eating well.  He denies any signs of lymphadenopathy such as  bumps or lumps under his arms or in his neck.  Lymphadenopathy in his groin area is stable.  He reports that his weight has been steady and his appetite is been strong.  He is gained about 1 pound in the interim since her last visit.  He reports he is not taking any new medications and has had no other changes in his health.   He is not having any pain, fevers, chills, sweats, nausea, vomiting or diarrhea.  Overall he feels well and has no questions comments or concerns.  A full 10 point ROS is listed below.    MEDICAL HISTORY:  Past Medical History:  Diagnosis Date   Cancer [facility])    lymph nodes   Chronic lower back pain 15 years   Medical history non-contributory    Tooth pain    no abscess or drainage per pt 02-01-2020    SURGICAL HISTORY: Past Surgical History:  Procedure Laterality Date   INCISION AND DRAINAGE PERIRECTAL ABSCESS  06/16/2017   INCISION AND DRAINAGE PERIRECTAL ABSCESS N/A 06/16/2017   Procedure: IRRIGATION AND DEBRIDEMENT PERIRECTAL ABSCESS;  Surgeon: Coralie Keens, MD;  Location: [place];  Service: General;  Laterality: N/A;   INGUINAL HERNIA REPAIR Right 2000s   IR IMAGING GUIDED PORT INSERTION  02/15/2020   IR REMOVAL TUN ACCESS W/ PORT W/O FL MOD SED  09/14/2020   LYMPH NODE BIOPSY Right 02/02/2020   Procedure: EXCISIONAL INGUINAL LYMPH NODE BIOPSY;  Surgeon: Leighton Ruff, MD;  Location: [facility];  Service: General;  Laterality: Right;   PROCTOSCOPY  06/16/2017  Procedure: RIGID PROCTOSCOPY;  Surgeon: Coralie Keens, MD;  Location: [place];  Service: General;;    SOCIAL HISTORY: Social History   Socioeconomic History   Marital status: Married    Spouse name: Not on file   Number of children: Not on file   Years of education: Not on file   Highest education level: Not on file  Occupational History   Not on file  Tobacco Use   Smoking status: Former    Packs/day: 0.12    Years: 37.00    Total pack years: 4.44    Types: Cigarettes     Quit date: 05/20/2017    Years since quitting: 4.6   Smokeless tobacco: Never  Vaping Use   Vaping Use: Never used  Substance and Sexual Activity   Alcohol use: Not Currently    Comment: rare   Drug use: Yes    Types: Marijuana    Comment: 02-01-2020 "once a day"; used in last 24 hours   Sexual activity: Yes  Other Topics Concern   Not on file  Social History Narrative   Not on file   Social Determinants of Health   Financial Resource Strain: Not on file  Food Insecurity: Not on file  Transportation Needs: Not on file  Physical Activity: Not on file  Stress: Not on file  Social Connections: Not on file  Intimate Partner Violence: Not on file    FAMILY HISTORY: Family History  Problem Relation Age of Onset   Diabetes Neg Hx     ALLERGIES:  has No Known Allergies.  MEDICATIONS:  No current outpatient medications on file.   No current facility-administered medications for this visit.    REVIEW OF SYSTEMS:   Constitutional: ( - ) fevers, ( - )  chills , ( - ) night sweats Eyes: ( - ) blurriness of vision, ( - ) double vision, ( - ) watery eyes Ears, nose, mouth, throat, and face: ( - ) mucositis, ( - ) sore throat Respiratory: ( - ) cough, ( - ) dyspnea, ( - ) wheezes Cardiovascular: ( - ) palpitation, ( - ) chest discomfort, ( - ) lower extremity swelling Gastrointestinal:  ( - ) nausea, ( - ) heartburn, ( - ) change in bowel habits Skin: ( - ) abnormal skin rashes Lymphatics: ( - ) new lymphadenopathy, ( - ) easy bruising Neurological: ( - ) numbness, ( - ) tingling, ( - ) new weaknesses Behavioral/Psych: ( - ) mood change, ( - ) new changes  All other systems were reviewed with the patient and are negative.  PHYSICAL EXAMINATION: ECOG PERFORMANCE STATUS: 0 - Asymptomatic  Vitals:   01/27/22 1059  BP: 106/71  Pulse: 83  Resp: 16  Temp: 98 F (36.7 C)  SpO2: 100%   Filed Weights   01/27/22 1059  Weight: 141 lb 1.6 oz (64 kg)    GENERAL: well appearing  middle aged [place] American male. alert, no distress and comfortable SKIN: skin color, texture, turgor are normal, no rashes or significant lesions EYES: conjunctiva are pink and non-injected, sclera clear LYMPH: No lymphadenopathy palpable in the cervical lymph nodes.  No palpable lymphadenopathy in the groin LUNGS: clear to auscultation and percussion with normal breathing effort HEART: regular rate & rhythm and no murmurs and no lower extremity edema Musculoskeletal: no cyanosis of digits and no clubbing  PSYCH: alert & oriented x 3, fluent speech NEURO: no focal motor/sensory deficits  LABORATORY DATA:  I have reviewed the data  as listed    Latest Ref Rng & Units 01/27/2022   10:40 AM 10/28/2021    9:52 AM 07/29/2021   10:14 AM  CBC  WBC 4.0 - 10.5 K/uL 5.2  4.9  6.4   Hemoglobin 13.0 - 17.0 g/dL 13.9  14.3  13.9   Hematocrit 39.0 - 52.0 % 40.4  41.6  40.9   Platelets 150 - 400 K/uL 253  239  269        Latest Ref Rng & Units 01/27/2022   10:40 AM 10/28/2021    9:52 AM 07/29/2021   10:14 AM  CMP  Glucose 70 - 99 mg/dL 92  104  107   BUN 6 - 20 mg/dL '13  16  14   '$ Creatinine 0.61 - 1.24 mg/dL 0.94  0.88  1.01   Sodium 135 - 145 mmol/L 139  138  141   Potassium 3.5 - 5.1 mmol/L 4.1  4.0  4.0   Chloride 98 - 111 mmol/L 105  105  106   CO2 22 - 32 mmol/L '28  28  30   '$ Calcium 8.9 - 10.3 mg/dL 9.6  9.2  9.5   Total Protein 6.5 - 8.1 g/dL 7.1  7.7  7.3   Total Bilirubin 0.3 - 1.2 mg/dL 0.5  0.5  0.4   Alkaline Phos 38 - 126 U/L 61  61  59   AST 15 - 41 U/L '16  14  14   '$ ALT 0 - 44 U/L '10  11  10     '$ RADIOGRAPHIC STUDIES: CT CHEST ABDOMEN PELVIS W CONTRAST  Result Date: 01/16/2022 CLINICAL DATA:  Hematologic malignancy peripheral T-cell lymphoma of intrathoracic lymph nodes, monitor. * Tracking Code: BO * EXAM: CT CHEST, ABDOMEN, AND PELVIS WITH CONTRAST TECHNIQUE: Multidetector CT imaging of the chest, abdomen and pelvis was performed following the standard protocol during bolus  administration of intravenous contrast. RADIATION DOSE REDUCTION: This exam was performed according to the departmental dose-optimization program which includes automated exposure control, adjustment of the mA and/or kV according to patient size and/or use of iterative reconstruction technique. CONTRAST:  172m OMNIPAQUE IOHEXOL 300 MG/ML  SOLN COMPARISON:  Multiple priors including CT June 25, 2021. FINDINGS: CT CHEST FINDINGS Cardiovascular: Normal caliber thoracic aorta. No central pulmonary embolus on this nondedicated study. Normal size heart. No significant pericardial effusion/thickening. Gas in the pulmonary outflow tract from vascular access. Mediastinum/Nodes: No supraclavicular adenopathy. No suspicious thyroid nodule. No pathologically enlarged mediastinal, hilar or axillary lymph nodes. The esophagus is grossly unremarkable. Lungs/Pleura: Similar mild biapical pleuroparenchymal scarring. Stable tiny scattered benign pulmonary nodules for instance in the right upper lobe measuring 3 mm on image 79/7 and in the left upper lobe measuring 4 mm on image 98/7. No new suspicious pulmonary nodules or masses. No focal airspace consolidation. No pleural effusion. No pneumothorax. Musculoskeletal: No aggressive lytic or blastic lesion of bone. CT ABDOMEN PELVIS FINDINGS Hepatobiliary: Benign hepatic hemangiomata measuring up to 18 mm in segment VII on image 52/2 are stable from prior examinations. No suspicious hepatic lesions. Gallbladder is unremarkable. No biliary ductal dilation. Pancreas: No pancreatic ductal dilation or evidence of acute inflammation. Spleen: No splenomegaly or focal splenic lesion. Adrenals/Urinary Tract: Bilateral adrenal glands appear normal. No hydronephrosis. Kidneys demonstrate symmetric enhancement and excretion of contrast material. Urinary bladder is unremarkable for degree of distension. Stomach/Bowel: Radiopaque enteric contrast material traverses the descending colon. Stomach is  minimally distended limiting evaluation. No pathologic dilation of small or large bowel. Moderate  volume of formed stool throughout the colon suggestive of constipation. Similar rectal wall thickening with perirectal/perianal inflammation. Vascular/Lymphatic: Scattered aortic atherosclerosis. Normal caliber abdominal aorta. No pathologically enlarged abdominal or pelvic lymph nodes. Stable prominent bilateral inguinal lymph nodes measuring up to 7 mm in short axis on image 120/2, and not pathologically enlarged by size criteria. Reproductive: Enlarged prostate gland. Other: Similar inflammation along the left gluteal crease with nodular right perirectal inflammatory soft tissue on image 125/2. Musculoskeletal: No aggressive lytic or blastic lesion of bone. Multilevel degenerative changes spine. Advanced degenerative changes of the bilateral hips. IMPRESSION: 1. Stable examination without pathologically enlarged lymph nodes above or below the diaphragm and no splenomegaly. 2. Similar rectal wall thickening with perirectal/perianal inflammation and inflammation extending along the left gluteal crease with unchanged nodular right perirectal inflammatory soft tissue, compatible with known perirectal/perianal fistula. No new drainable fluid collection. 3. Moderate volume of formed stool throughout the colon suggestive of constipation. 4.  Aortic Atherosclerosis (ICD10-I70.0). Electronically Signed   By: Dahlia Bailiff M.D.   On: 01/16/2022 11:29    ASSESSMENT & PLAN Tanner Gallagher 57 y.o. male with no significant medical history presents for evaluation of Anaplastic T Cell Lymphoma.  Review the labs, review the records, discussion with the patient the findings are most consistent with Anaplastic T Cell Lymphoma.     PET CT scan performed on 02/24/2020 confirmed Stage III disease with involvement of the axillary and inguinal lymph nodes. He has also had a port placed and echocardiogram performed. Post treatment PET CT  scan on 06/29/2020 showed Deauville 3 inguinal lymph nodes, returned to normal size. Near complete resolution of axillary adenopathy. CT scan on 01/09/2021 showed stable examination including prominent inguinal lymph nodes without pathologically enlarged lymph nodes within the chest, abdomen, or pelvis and no splenomegaly.  The treatment of choice for this patient's cancer would be BV-CHP chemotherapy.  This regimen consists of doxorubicin 50 mg per metered squared on day 1, cyclophosphamide 750 mg per metered squared on day 1, and brentuximab 1.8 mg/kg on day 1 of a 21-day cycle.  The patient also received prednisone '60mg'$  PO on days 1-5.  He completed 6 cycles.   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  --port had been placed, TTE shows adequate heart function for anthracycline therapy.  --initial PET CT scan shows involvement of the inguinal/axillary lymph nodes. Findings consistent with Stage III disease.  --patient completed 6 cycles of  BV-CHP chemotherapy as noted above. Cycle 1 Day 1 of treatment started on 02/23/2020.  --post treatment PET CT scan on 06/29/2020 showed Deauville 3 inguinal lymph nodes, returned to normal size. Near complete resolution of axillary adenopathy.  Plan: --Per NCCN recommendations, will follow the patient q 3 months with labs and q 6 months for CT C/A/P x 2 years. Next CT scan due in June 2024.  --labs today show white blood cell count 5.2, hemoglobin 13.9, MCV 92.4, and platelets of 253. --RTC in 3 months for continue monitoring.    No orders of the defined types were placed in this encounter.   All questions were answered. The patient knows to call the clinic with any problems, questions or concerns.  A total of more than 25 minutes were spent on this encounter and over half of that time was spent on counseling and coordination of care as outlined above.   Ledell Peoples,  MD Department of  Hematology/Oncology [place] [facility] Phone: [phone] Pager: [phone] Email: [email or web address]  01/27/2022 11:27 AM   Literature Support:  Lynden Ang OA, Pro B, [place], [place], [place], [place], [place], Morschhauser F, Domingo-Domenech E, Margot Chimes [address] D, [place] , [place], [place], [place] SP, Shustov A, Httmann A, [place], Yuen S, Iyer S, Zinzani PL, Hua Z, [place] M, Romelle Starcher T, Trmper L; [place] Study Group. 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 Lancet. 2019 Jan 19;393(10168):229-240.   --Median progression-free survival was 482 months (95% CI 352-not evaluable) in the A+CHP group and 208 months (127-476) in the CHOP group (hazard ratio 071 [95% CI 054-093], p=[id number]).   --Front-line treatment with A+CHP is superior to CHOP for patients with CD30-positive peripheral T-cell lymphomas as shown by a significant improvement in progression-free survival and overall survival with a manageable safety profile.

## 2022-01-28 ENCOUNTER — Telehealth: Payer: Self-pay | Admitting: Hematology and Oncology

## 2022-01-28 NOTE — Telephone Encounter (Signed)
Called patient to notify of new appointments. Left voicemail with appointment information.  

## 2022-02-05 ENCOUNTER — Encounter: Payer: Self-pay | Admitting: Hematology and Oncology

## 2022-02-24 ENCOUNTER — Encounter: Payer: Self-pay | Admitting: Hematology and Oncology

## 2022-04-01 ENCOUNTER — Encounter: Payer: Self-pay | Admitting: Hematology and Oncology

## 2022-04-02 ENCOUNTER — Encounter: Payer: Self-pay | Admitting: Hematology and Oncology

## 2022-04-09 ENCOUNTER — Encounter: Payer: Self-pay | Admitting: Hematology and Oncology

## 2022-04-27 ENCOUNTER — Other Ambulatory Visit: Payer: Self-pay | Admitting: Hematology and Oncology

## 2022-04-27 DIAGNOSIS — C8442 Peripheral T-cell lymphoma, not classified, intrathoracic lymph nodes: Secondary | ICD-10-CM

## 2022-04-28 ENCOUNTER — Inpatient Hospital Stay (HOSPITAL_BASED_OUTPATIENT_CLINIC_OR_DEPARTMENT_OTHER): Payer: Medicaid Other | Admitting: Hematology and Oncology

## 2022-04-28 ENCOUNTER — Inpatient Hospital Stay: Payer: Medicaid Other | Attending: Hematology and Oncology

## 2022-04-28 ENCOUNTER — Telehealth: Payer: Self-pay | Admitting: Hematology and Oncology

## 2022-04-28 VITALS — BP 111/70 | HR 57 | Temp 98.4°F | Resp 16 | Wt 142.9 lb

## 2022-04-28 DIAGNOSIS — C8475 Anaplastic large cell lymphoma, ALK-negative, lymph nodes of inguinal region and lower limb: Secondary | ICD-10-CM | POA: Insufficient documentation

## 2022-04-28 DIAGNOSIS — C8442 Peripheral T-cell lymphoma, not classified, intrathoracic lymph nodes: Secondary | ICD-10-CM | POA: Diagnosis not present

## 2022-04-28 DIAGNOSIS — Z87891 Personal history of nicotine dependence: Secondary | ICD-10-CM | POA: Insufficient documentation

## 2022-04-28 LAB — CBC WITH DIFFERENTIAL (CANCER CENTER ONLY)
Abs Immature Granulocytes: 0 10*3/uL (ref 0.00–0.07)
Basophils Absolute: 0.1 10*3/uL (ref 0.0–0.1)
Basophils Relative: 1 %
Eosinophils Absolute: 0.4 10*3/uL (ref 0.0–0.5)
Eosinophils Relative: 8 %
HCT: 41.7 % (ref 39.0–52.0)
Hemoglobin: 14.1 g/dL (ref 13.0–17.0)
Immature Granulocytes: 0 %
Lymphocytes Relative: 55 %
Lymphs Abs: 2.7 10*3/uL (ref 0.7–4.0)
MCH: 31.3 pg (ref 26.0–34.0)
MCHC: 33.8 g/dL (ref 30.0–36.0)
MCV: 92.5 fL (ref 80.0–100.0)
Monocytes Absolute: 0.6 10*3/uL (ref 0.1–1.0)
Monocytes Relative: 12 %
Neutro Abs: 1.2 10*3/uL — ABNORMAL LOW (ref 1.7–7.7)
Neutrophils Relative %: 24 %
Platelet Count: 206 10*3/uL (ref 150–400)
RBC: 4.51 MIL/uL (ref 4.22–5.81)
RDW: 14.7 % (ref 11.5–15.5)
WBC Count: 4.9 10*3/uL (ref 4.0–10.5)
nRBC: 0 % (ref 0.0–0.2)

## 2022-04-28 LAB — CMP (CANCER CENTER ONLY)
ALT: 31 U/L (ref 0–44)
AST: 25 U/L (ref 15–41)
Albumin: 4.3 g/dL (ref 3.5–5.0)
Alkaline Phosphatase: 57 U/L (ref 38–126)
Anion gap: 4 — ABNORMAL LOW (ref 5–15)
BUN: 16 mg/dL (ref 6–20)
CO2: 30 mmol/L (ref 22–32)
Calcium: 9.8 mg/dL (ref 8.9–10.3)
Chloride: 104 mmol/L (ref 98–111)
Creatinine: 0.94 mg/dL (ref 0.61–1.24)
GFR, Estimated: >60 mL/min (ref >60)
Glucose, Bld: 83 mg/dL (ref 70–99)
Potassium: 4.5 mmol/L (ref 3.5–5.1)
Sodium: 138 mmol/L (ref 135–145)
Total Bilirubin: 0.8 mg/dL (ref 0.3–1.2)
Total Protein: 7.4 g/dL (ref 6.5–8.1)

## 2022-04-28 LAB — LACTATE DEHYDROGENASE: LDH: 147 U/L (ref 98–192)

## 2022-04-28 NOTE — Telephone Encounter (Signed)
reached out to patient per 4/8 LOS, left voicemail. will mail reminders.

## 2022-04-28 NOTE — Progress Notes (Signed)
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 Previously seen in June 2021 and given course of doxycycline. Infectious workup including RPR, HIV, GC/chlamydia were negative.  2) 09/23/2019: presented to PCP with concern for bilaterally enlarged lymph nodes of the groin. Noted to have scant drainage 3) 10/12/2019: establish care with Dr. 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  5) 02/23/2020: Cycle 1 Day 1 of BV-CHP 6) 03/15/2020: Cycle 2 Day 1 of BV-CHP 7) 04/05/2020: Cycle 3 Day 1 of BV-CHP 8) 04/26/2020: Cycle 4 Day 1 of BV-CHP 9) 05/17/2020:  Cycle 5 Day 1 of BV-CHP 10) 06/07/2020: Cycle 6 Day 1 of BV-CHP 11) 06/29/2020: PET CT scan showed near complete resolution of left axillary metabolic adenopathy as well as marked reduction in the metabolic activity of the inguinal lymph nodes down to Deauville score 3.  Findings are consistent with a [place] complete metabolic response 12) 06/26/2021: CT scan showed prominent non pathologically enlarged inguinal lymph nodes, without evidence of adenopathy in the chest, abdomen or pelvis. No splenomegaly  Interval History:  Tanner Gallagher 57 y.o. male with medical history significant for Anaplastic T Cell Lymphoma who presents for a follow up visit. The patient's last visit was on 01/27/2022. In the interim since the last visit the patient has had no changes in his health.  On exam today Tanner Gallagher notes he has been well overall in the interim since her last visit.  He currently describes his energy as a 10 out of 10.  His appetite is good and he is had no infectious symptoms.  His weight has been steady and he has not started any new  medication.  He denies any bumps or lumps concerning for lymphadenopathy and reports he has not had any growth or increase in size of the lymph nodes of his groin area.  He notes that he likes to spend time at the park with his grandchildren whom range from age 68-15.   He is not having any pain, fevers, chills, sweats, nausea, vomiting or diarrhea.  Overall he feels well and has no questions comments or concerns.  A full 10 point ROS is listed below.    MEDICAL HISTORY:  Past Medical History:  Diagnosis Date   Cancer [facility])    lymph nodes   Chronic lower back pain 15 years   Medical history non-contributory    Tooth pain    no abscess or drainage per pt 02-01-2020    SURGICAL HISTORY: Past Surgical History:  Procedure Laterality Date   INCISION AND DRAINAGE PERIRECTAL ABSCESS  06/16/2017   INCISION AND DRAINAGE PERIRECTAL ABSCESS N/A 06/16/2017   Procedure: IRRIGATION AND DEBRIDEMENT PERIRECTAL ABSCESS;  Surgeon: Abigail Miyamoto, MD;  Location: [facility] OR;  Service: General;  Laterality: N/A;   INGUINAL HERNIA REPAIR Right 2000s   IR IMAGING GUIDED PORT INSERTION  02/15/2020   IR REMOVAL TUN ACCESS W/ PORT W/O FL MOD SED  09/14/2020   LYMPH NODE BIOPSY Right 02/02/2020   Procedure: EXCISIONAL INGUINAL LYMPH NODE BIOPSY;  Surgeon: Romie Levee, MD;  Location: [facility];  Service: General;  Laterality: Right;   PROCTOSCOPY  06/16/2017   Procedure: RIGID PROCTOSCOPY;  Surgeon: Abigail Miyamoto, MD;  Location: [facility] OR;  Service: General;;    SOCIAL HISTORY: Social History   Socioeconomic History   Marital status: Married    Spouse name: Not on file   Number of children: Not on file   Years of education: Not on file   Highest education level: Not on file  Occupational History   Not on file  Tobacco Use   Smoking status: Former    Packs/day: 0.12    Years: 37.00    Additional pack years: 0.00    Total pack years: 4.44    Types: Cigarettes    Quit date: 05/20/2017     Years since quitting: 4.9   Smokeless tobacco: Never  Vaping Use   Vaping Use: Never used  Substance and Sexual Activity   Alcohol use: Not Currently    Comment: rare   Drug use: Yes    Types: Marijuana    Comment: 02-01-2020 "once a day"; used in last 24 hours   Sexual activity: Yes  Other Topics Concern   Not on file  Social History Narrative   Not on file   Social Determinants of Health   Financial Resource Strain: Not on file  Food Insecurity: Not on file  Transportation Needs: Not on file  Physical Activity: Not on file  Stress: Not on file  Social Connections: Not on file  Intimate Partner Violence: Not on file    FAMILY HISTORY: Family History  Problem Relation Age of Onset   Diabetes Neg Hx     ALLERGIES:  has No Known Allergies.  MEDICATIONS:  No current outpatient medications on file.   No current facility-administered medications for this visit.    REVIEW OF SYSTEMS:   Constitutional: ( - ) fevers, ( - )  chills , ( - ) night sweats Eyes: ( - ) blurriness of vision, ( - ) double vision, ( - ) watery eyes Ears, nose, mouth, throat, and face: ( - ) mucositis, ( - ) sore throat Respiratory: ( - ) cough, ( - ) dyspnea, ( - ) wheezes Cardiovascular: ( - ) palpitation, ( - ) chest discomfort, ( - ) lower extremity swelling Gastrointestinal:  ( - ) nausea, ( - ) heartburn, ( - ) change in bowel habits Skin: ( - ) abnormal skin rashes Lymphatics: ( - ) new lymphadenopathy, ( - ) easy bruising Neurological: ( - ) numbness, ( - ) tingling, ( - ) new weaknesses Behavioral/Psych: ( - ) mood change, ( - ) new changes  All other systems were reviewed with the patient and are negative.  PHYSICAL EXAMINATION: ECOG PERFORMANCE STATUS: 0 - Asymptomatic  Vitals:   04/28/22 1122  BP: 111/70  Pulse: (!) 57  Resp: 16  Temp: 98.4 F (36.9 C)  SpO2: 99%    Filed Weights   04/28/22 1122  Weight: 142 lb 14.4 oz (64.8 kg)     GENERAL: well appearing middle aged  [place] male. alert, no distress and comfortable SKIN: skin color, texture, turgor are normal, no rashes or significant lesions EYES: conjunctiva are pink and non-injected, sclera clear LYMPH: No lymphadenopathy palpable in the cervical lymph nodes.  No palpable lymphadenopathy in the groin LUNGS: clear to auscultation and percussion with normal breathing effort HEART: regular rate & rhythm and no murmurs and no lower extremity edema Musculoskeletal: no cyanosis of digits and no clubbing  PSYCH: alert & oriented x 3, fluent speech NEURO: no focal motor/sensory deficits  LABORATORY DATA:  I have reviewed the  data as listed    Latest Ref Rng & Units 04/28/2022   10:24 AM 01/27/2022   10:40 AM 10/28/2021    9:52 AM  CBC  WBC 4.0 - 10.5 K/uL 4.9  5.2  4.9   Hemoglobin 13.0 - 17.0 g/dL 40.914.1  81.113.9  91.414.3   Hematocrit 39.0 - 52.0 % 41.7  40.4  41.6   Platelets 150 - 400 K/uL 206  253  239        Latest Ref Rng & Units 04/28/2022   10:24 AM 01/27/2022   10:40 AM 10/28/2021    9:52 AM  CMP  Glucose 70 - 99 mg/dL 83  92  [id number]   BUN 6 - 20 mg/dL 16  13  16    Creatinine 0.61 - 1.24 mg/dL 9.560.94  2.130.94  0.860.88   Sodium 135 - 145 mmol/L 138  139  138   Potassium 3.5 - 5.1 mmol/L 4.5  4.1  4.0   Chloride 98 - 111 mmol/L 104  105  105   CO2 22 - 32 mmol/L 30  28  28    Calcium 8.9 - 10.3 mg/dL 9.8  9.6  9.2   Total Protein 6.5 - 8.1 g/dL 7.4  7.1  7.7   Total Bilirubin 0.3 - 1.2 mg/dL 0.8  0.5  0.5   Alkaline Phos 38 - 126 U/L 57  61  61   AST 15 - 41 U/L 25  16  14    ALT 0 - 44 U/L 31  10  11      RADIOGRAPHIC STUDIES: No results found.  ASSESSMENT & PLAN Tanner Gallagher 57 y.o. male with no significant medical history presents for evaluation of Anaplastic T Cell Lymphoma.  Review the labs, review the records, discussion with the patient the findings are most consistent with Anaplastic T Cell Lymphoma.     PET CT scan performed on 02/24/2020 confirmed Stage III disease with involvement of the  axillary and inguinal lymph nodes. He has also had a port placed and echocardiogram performed. Post treatment PET CT scan on 06/29/2020 showed Deauville 3 inguinal lymph nodes, returned to normal size. Near complete resolution of axillary adenopathy. CT scan on 01/09/2021 showed stable examination including prominent inguinal lymph nodes without pathologically enlarged lymph nodes within the chest, abdomen, or pelvis and no splenomegaly.  The treatment of choice for this patient's cancer would be BV-CHP chemotherapy.  This regimen consists of doxorubicin 50 mg per metered squared on day 1, cyclophosphamide 750 mg per metered squared on day 1, and brentuximab 1.8 mg/kg on day 1 of a 21-day cycle.  The patient also received prednisone 60mg  PO on days 1-5.  He completed 6 cycles.   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  --port had been placed, TTE shows adequate heart function for anthracycline therapy.  --initial PET CT scan shows involvement of the inguinal/axillary lymph nodes. Findings consistent with Stage III disease.  --patient completed 6 cycles of  BV-CHP chemotherapy as noted above. Cycle 1 Day 1 of treatment started on 02/23/2020.  --post treatment PET CT scan on 06/29/2020 showed Deauville 3 inguinal lymph nodes, returned to normal size. Near complete resolution of axillary adenopathy.  Plan: --Per NCCN recommendations, will follow the patient q 3 months with labs and q 6 months for CT C/A/P x 2 years. Next CT scan due in June 2024.  --labs today show white blood  cell count 4.9, Hgb 14.1, MCV 92.5, Plt 206, Cr 0.94, LFTs WNL.  --no signs of symptoms concerning for recurrent disease.  --RTC in 3 months for continue monitoring.  At next visit patient will be 2 years out from the end of his treatment and we will build to convert to every 6 month clinic visits with yearly CT scans.   Orders Placed This  Encounter  Procedures   CT CHEST ABDOMEN PELVIS W CONTRAST    Standing Status:   Future    Standing Expiration Date:   04/28/2023    Order Specific Question:   If indicated for the ordered procedure, I authorize the administration of contrast media per Radiology protocol    Answer:   Yes    Order Specific Question:   Does the patient have a contrast media/X-ray dye allergy?    Answer:   No    Order Specific Question:   Preferred imaging location?    Answer:   [facility]    Order Specific Question:   Is Oral Contrast requested for this exam?    Answer:   Yes, Per Radiology protocol    All questions were answered. The patient knows to call the clinic with any problems, questions or concerns.  A total of more than 25 minutes were spent on this encounter and over half of that time was spent on counseling and coordination of care as outlined above.   Ulysees Barns, MD Department of Hematology/Oncology [facility] at [facility] Phone: [phone] Pager: [phone] Email: [email or web address]  04/28/2022 4:08 PM   Literature Support:  Alycia Rossetti OA, Pro B, [place], [place], [place], [place], Sweeny, Morschhauser F, Domingo-Domenech E, Margret Chance [address], [place] , [place], [place], Linn SP, Shustov A, Httmann A, [place], Yuen S, Iyer S, Zinzani PL, Hua Z, Little M, Verita Lamb T, Trmper L; ECHELON-2 Study Group. 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 Lancet. 2019 Jan 19;393(10168):229-240.   --Median progression-free survival was 482 months (95% CI 352-not evaluable) in the A+CHP group and 208 months (127-476) in the CHOP group (hazard ratio 071 [95% CI 054-093], p=[id number]).   --Front-line treatment with A+CHP is superior to CHOP for patients with CD30-positive  peripheral T-cell lymphomas as shown by a significant improvement in progression-free survival and overall survival with a manageable safety profile.

## 2022-05-19 IMAGING — CT CT ABD-PELV W/ CM
2 of 5 series · 15 of 46 positions shown, 17 images · IV contrast (OMNIPAQUE)
Comparison: 06/16/2017

CLINICAL DATA: Bilateral inguinal lymphadenopathy. Unintentional
weight loss over past 2 months.

EXAM:
CT ABDOMEN AND PELVIS WITH CONTRAST
TECHNIQUE: Multidetector CT imaging of the abdomen and pelvis was performed
using the standard protocol following bolus administration of
intravenous contrast.
CONTRAST:  100mL OMNIPAQUE IOHEXOL 300 MG/ML  SOLN

[Series 2: axial st · axial · 0.68mm/px · z∈[+1033,+1418]mm · 12 of 91 slices shown, 14 images]
[im 7/91  soft-tissue]
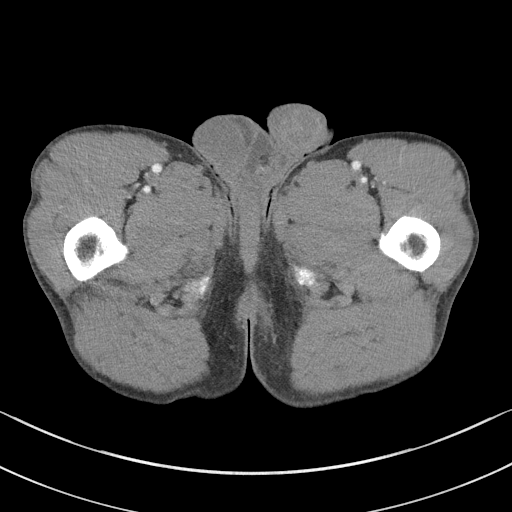
[im 7/91  bone]
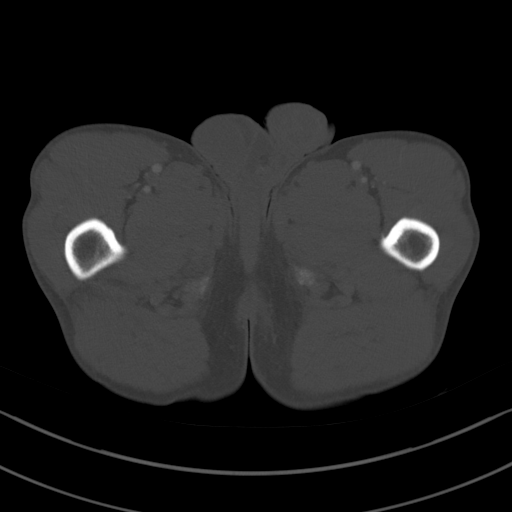
[im 13/91  soft-tissue]
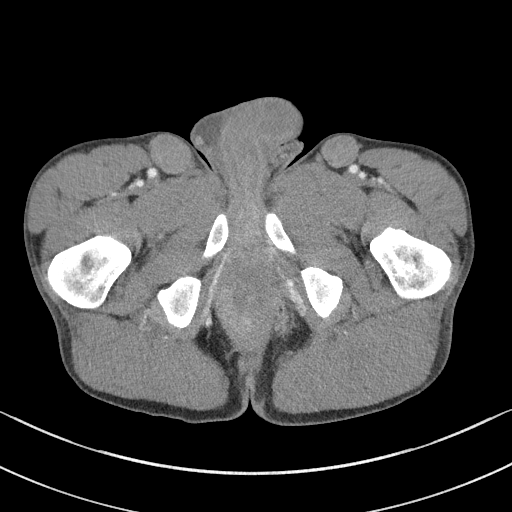
[im 20/91  soft-tissue]
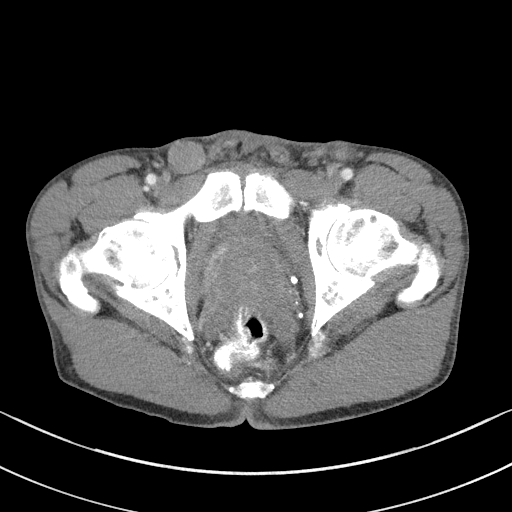
[im 26/91  soft-tissue]
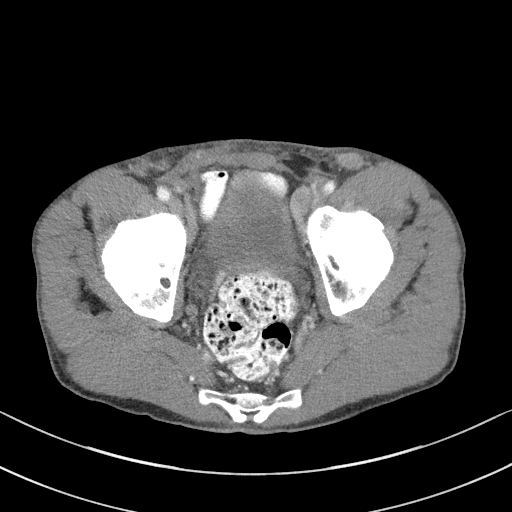
[im 33/91  soft-tissue]
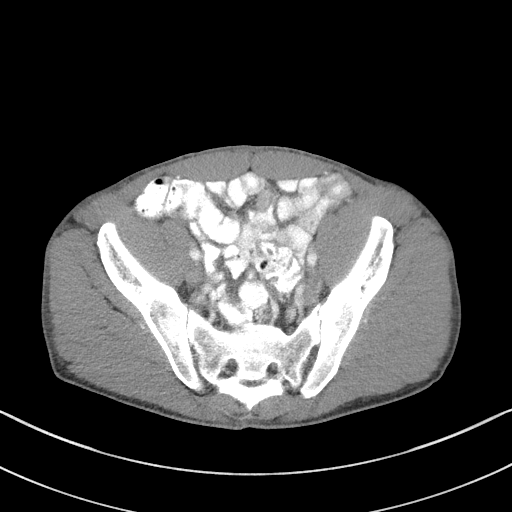
[im 39/91  soft-tissue]
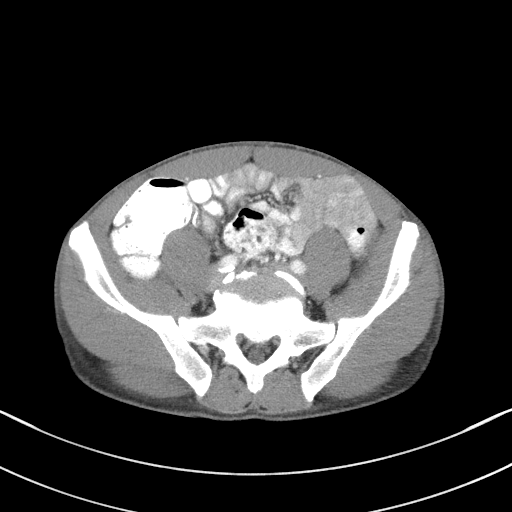
[im 52/91  soft-tissue]
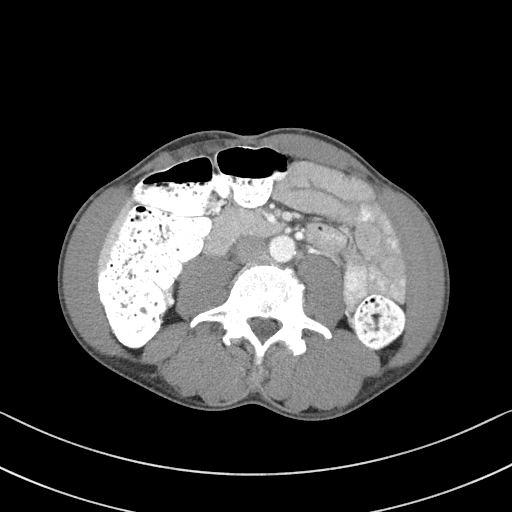
[im 58/91  soft-tissue]
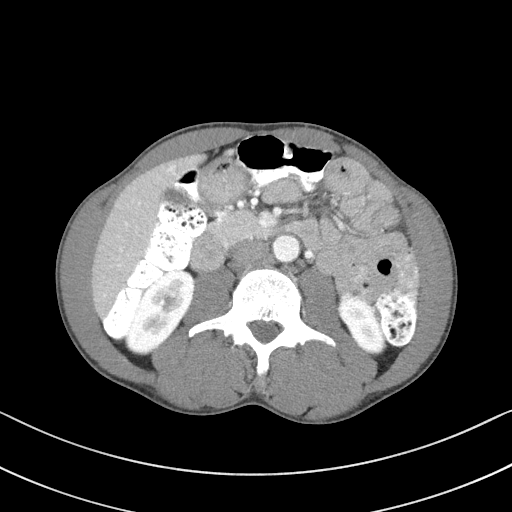
[im 65/91  soft-tissue]
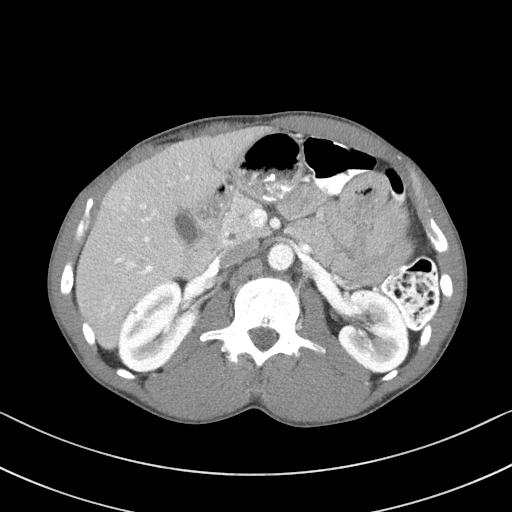
[im 65/91  bone]
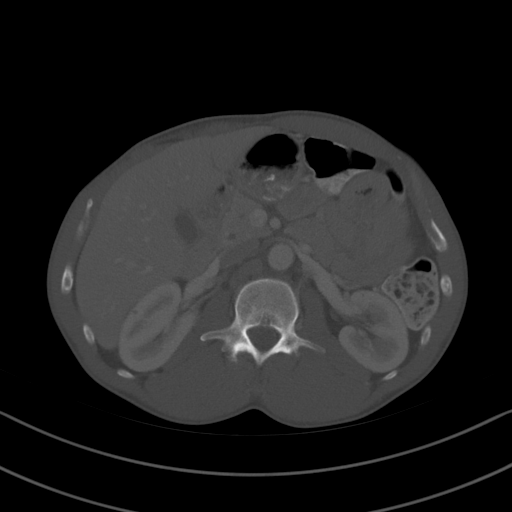
[im 71/91  soft-tissue]
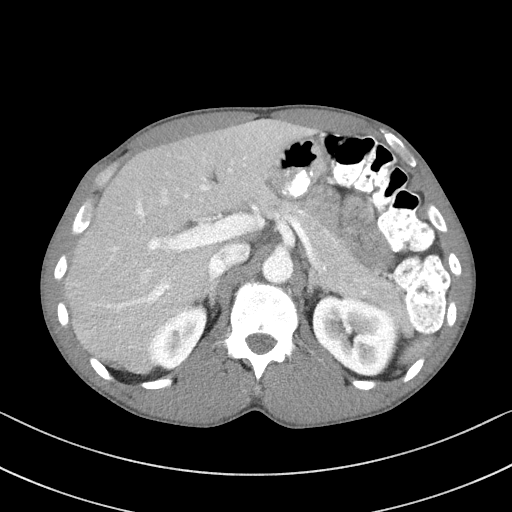
[im 78/91  soft-tissue]
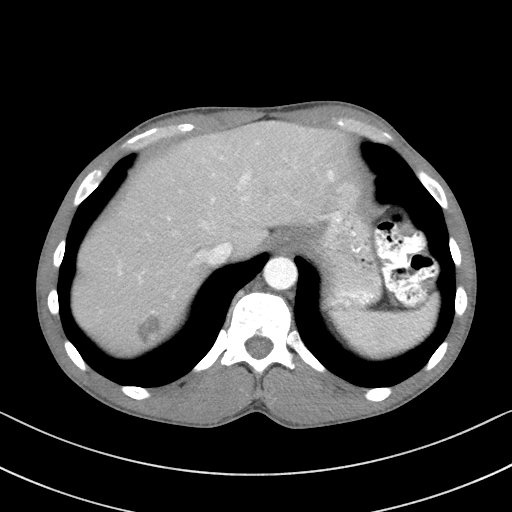
[im 84/91  soft-tissue]
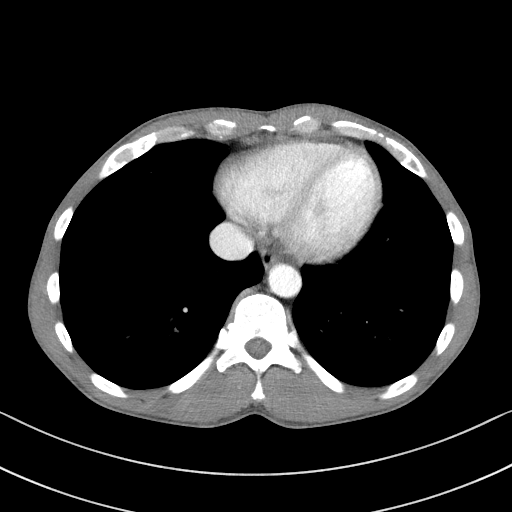

[Series 5: coronal st · coronal · 0.59mm/px · 3 of 79 slices shown]
[im 27/79  soft-tissue]
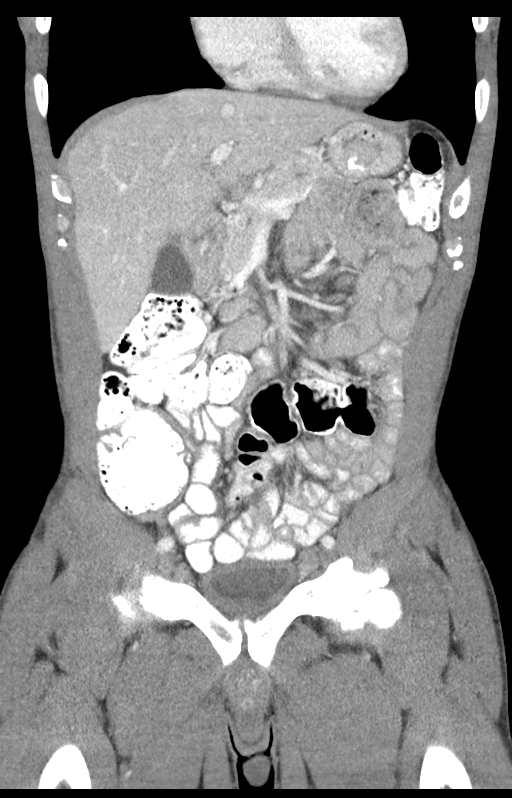
[im 35/79  soft-tissue]
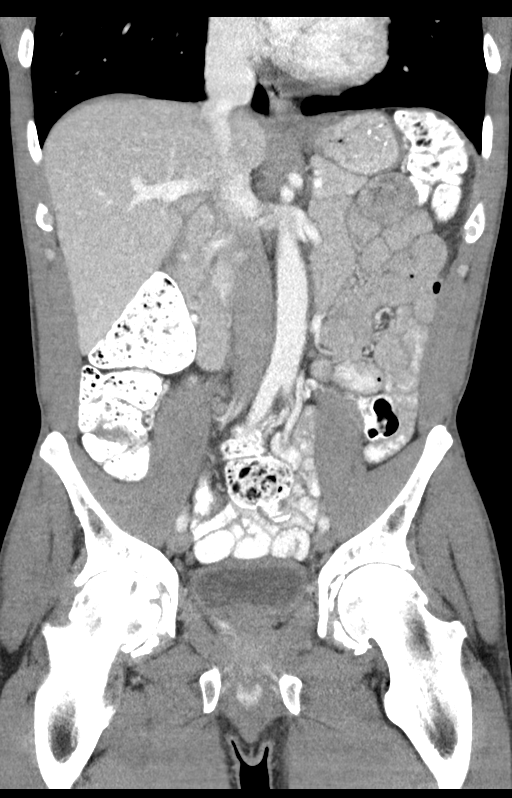
[im 44/79  soft-tissue]
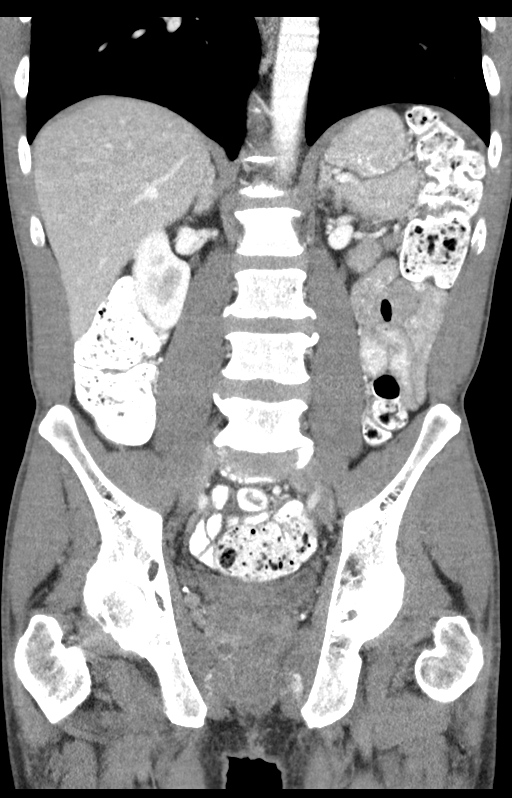

[15 of 46 positions shown; findings below may reference images not displayed]

FINDINGS: Lower Chest: No acute findings.

Hepatobiliary: 2.2 cm benign hemangioma again seen in the posterior
right hepatic lobe. 2 sub-cm lesions are noted in the left lobe
which are too small to characterize but likely represent additional
tiny hemangiomas. No suspicious hepatic masses are identified.
Gallbladder is unremarkable. No evidence of biliary ductal
dilatation.

Pancreas:  No mass or inflammatory changes.

Spleen: Within normal limits in size and appearance.

Adrenals/Urinary Tract: No masses identified. No evidence of
ureteral calculi or hydronephrosis.

Stomach/Bowel: No evidence of obstruction, inflammatory process or
abnormal fluid collections.

Vascular/Lymphatic: New bilateral inguinal lymphadenopathy is seen
with largest lymph node on the right measuring 2.3 cm short axis on
image 79/2. Mildly enlarged left external iliac lymph node is seen
measuring 1.3 cm on image 65/2. No other sites lymphadenopathy
identified. Aortic atherosclerosis noted. No abdominal aortic
aneurysm.

Reproductive:  No mass or other significant abnormality.

Other:  None.

Musculoskeletal: No suspicious bone lesions identified. Severe
bilateral hip osteoarthritis noted.
IMPRESSION: New bilateral inguinal and left external iliac lymphadenopathy.
Differential diagnosis includes lymphoproliferative disorder,
metastatic disease, and reactive lymphadenopathy. Consider tissue
sampling.

Aortic Atherosclerosis ([id number]).

## 2022-07-17 ENCOUNTER — Ambulatory Visit (HOSPITAL_COMMUNITY)
Admission: RE | Admit: 2022-07-17 | Discharge: 2022-07-17 | Disposition: A | Discharge status: Home / Self Care | Payer: Medicaid Other | Source: Ambulatory Visit | Attending: Hematology and Oncology | Admitting: Hematology and Oncology

## 2022-07-17 DIAGNOSIS — C8442 Peripheral T-cell lymphoma, not classified, intrathoracic lymph nodes: Secondary | ICD-10-CM | POA: Diagnosis present

## 2022-07-17 MED ORDER — IOHEXOL 300 MG/ML  SOLN
100.0000 mL | Freq: Once | INTRAMUSCULAR | Status: AC | PRN
  Administered 2022-07-17: 100 mL via INTRAVENOUS

## 2022-07-17 MED ORDER — IOHEXOL 9 MG/ML PO SOLN
500.0000 mL | ORAL | Status: AC
  Administered 2022-07-17 (×2): 500 mL via ORAL

## 2022-07-17 MED ORDER — IOHEXOL 9 MG/ML PO SOLN
ORAL | Status: AC
  Filled 2022-07-17: qty 1000

## 2022-07-23 ENCOUNTER — Telehealth: Payer: Self-pay | Admitting: *Deleted

## 2022-07-23 NOTE — Telephone Encounter (Signed)
-----   Message from Jaci Standard, MD sent at 07/20/2022  4:12 PM EDT ----- Please let Tanner Gallagher know his CT scan showed no evidence of recurrent lymphoma. We will see him as scheduled on 07/28/2022  ----- Message ----- From: Interface, Rad Results In Sent: 07/18/2022   6:02 PM EDT To: Jaci Standard, MD

## 2022-07-23 NOTE — Telephone Encounter (Signed)
TCT patient regarding recent CT scan results.  No answer. No vm  available

## 2022-07-27 ENCOUNTER — Other Ambulatory Visit: Payer: Self-pay | Admitting: Hematology and Oncology

## 2022-07-27 DIAGNOSIS — C8442 Peripheral T-cell lymphoma, not classified, intrathoracic lymph nodes: Secondary | ICD-10-CM

## 2022-07-27 NOTE — Progress Notes (Unsigned)
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 Previously seen in June 2021 and given course of doxycycline. Infectious workup including RPR, HIV, GC/chlamydia were negative.  2) 09/23/2019: presented to PCP with concern for bilaterally enlarged lymph nodes of the groin. Noted to have scant drainage 3) 10/12/2019: establish care with Dr. 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  5) 02/23/2020: Cycle 1 Day 1 of BV-CHP 6) 03/15/2020: Cycle 2 Day 1 of BV-CHP 7) 04/05/2020: Cycle 3 Day 1 of BV-CHP 8) 04/26/2020: Cycle 4 Day 1 of BV-CHP 9) 05/17/2020:  Cycle 5 Day 1 of BV-CHP 10) 06/07/2020: Cycle 6 Day 1 of BV-CHP 11) 06/29/2020: PET CT scan showed near complete resolution of left axillary metabolic adenopathy as well as marked reduction in the metabolic activity of the inguinal lymph nodes down to Deauville score 3.  Findings are consistent with a [place] complete metabolic response 12) 06/26/2021: CT scan showed prominent non pathologically enlarged inguinal lymph nodes, without evidence of adenopathy in the chest, abdomen or pelvis. No splenomegaly 13) 07/17/2022: CT scan showed no pathologically enlarged inguinal lymph nodes, scan is without evidence of adenopathy in the chest, abdomen or pelvis. No splenomegaly  Interval History:  Tanner Gallagher 57 y.o. male with medical history significant for Anaplastic T Cell Lymphoma who presents for a follow up visit. The patient's last visit was on 04/28/2022. In the interim since the last visit the patient has had no changes in his health.  On exam today Tanner Gallagher notes he has been feeling great in the interim since our last visit.  He  reports her energy levels are currently a 10 out of 10.  His appetite has been great and he has been able to exercise and play basketball with his grandchildren without any difficulty.  He reports he has not noticed any bumps or lumps in his neck, underarms, or groin.  He reports that he does still have some occasional drainage from the lesion on his backside.  He notes he has not been connected with surgery since he first met with him back in 2021.  His weight is been stable at 141 pounds, though he thinks his ideal weight is about 152 pounds.  Otherwise he has no questions concerns or complaints today.  He is not having any pain, fevers, chills, sweats, nausea, vomiting or diarrhea.  A full 10 point ROS is listed below.    MEDICAL HISTORY:  Past Medical History:  Diagnosis Date   Cancer [facility])    lymph nodes   Chronic lower back pain 15 years   Medical history non-contributory    Tooth pain    no abscess or drainage per pt 02-01-2020    SURGICAL HISTORY: Past Surgical History:  Procedure Laterality Date   INCISION AND DRAINAGE PERIRECTAL ABSCESS  06/16/2017   INCISION AND DRAINAGE PERIRECTAL ABSCESS N/A 06/16/2017   Procedure: IRRIGATION AND DEBRIDEMENT PERIRECTAL ABSCESS;  Surgeon: Abigail Miyamoto, MD;  Location: [facility] OR;  Service: General;  Laterality: N/A;   INGUINAL HERNIA REPAIR Right 2000s   IR IMAGING GUIDED PORT INSERTION  02/15/2020   IR REMOVAL TUN ACCESS W/ PORT W/O FL MOD SED  09/14/2020   LYMPH NODE BIOPSY Right  02/02/2020   Procedure: EXCISIONAL INGUINAL LYMPH NODE BIOPSY;  Surgeon: Romie Levee, MD;  Location: [facility];  Service: General;  Laterality: Right;   PROCTOSCOPY  06/16/2017   Procedure: RIGID PROCTOSCOPY;  Surgeon: Abigail Miyamoto, MD;  Location: [facility] OR;  Service: General;;    SOCIAL HISTORY: Social History   Socioeconomic History   Marital status: Married    Spouse name: Not on file   Number of children: Not on file   Years of education:  Not on file   Highest education level: Not on file  Occupational History   Not on file  Tobacco Use   Smoking status: Former    Packs/day: 0.12    Years: 37.00    Additional pack years: 0.00    Total pack years: 4.44    Types: Cigarettes    Quit date: 05/20/2017    Years since quitting: 5.1   Smokeless tobacco: Never  Vaping Use   Vaping Use: Never used  Substance and Sexual Activity   Alcohol use: Not Currently    Comment: rare   Drug use: Yes    Types: Marijuana    Comment: 02-01-2020 "once a day"; used in last 24 hours   Sexual activity: Yes  Other Topics Concern   Not on file  Social History Narrative   Not on file   Social Determinants of Health   Financial Resource Strain: Not on file  Food Insecurity: Not on file  Transportation Needs: Not on file  Physical Activity: Not on file  Stress: Not on file  Social Connections: Not on file  Intimate Partner Violence: Not on file    FAMILY HISTORY: Family History  Problem Relation Age of Onset   Diabetes Neg Hx     ALLERGIES:  has No Known Allergies.  MEDICATIONS:  No current outpatient medications on file.   No current facility-administered medications for this visit.    REVIEW OF SYSTEMS:   Constitutional: ( - ) fevers, ( - )  chills , ( - ) night sweats Eyes: ( - ) blurriness of vision, ( - ) double vision, ( - ) watery eyes Ears, nose, mouth, throat, and face: ( - ) mucositis, ( - ) sore throat Respiratory: ( - ) cough, ( - ) dyspnea, ( - ) wheezes Cardiovascular: ( - ) palpitation, ( - ) chest discomfort, ( - ) lower extremity swelling Gastrointestinal:  ( - ) nausea, ( - ) heartburn, ( - ) change in bowel habits Skin: ( - ) abnormal skin rashes Lymphatics: ( - ) new lymphadenopathy, ( - ) easy bruising Neurological: ( - ) numbness, ( - ) tingling, ( - ) new weaknesses Behavioral/Psych: ( - ) mood change, ( - ) new changes  All other systems were reviewed with the patient and are negative.  PHYSICAL  EXAMINATION: ECOG PERFORMANCE STATUS: 0 - Asymptomatic  Vitals:   07/28/22 1128  BP: 110/68  Pulse: (!) 58  Resp: 18  Temp: 98 F (36.7 C)  SpO2: 100%     Filed Weights   07/28/22 1128  Weight: 141 lb 11.2 oz (64.3 kg)      GENERAL: well appearing middle aged [place] American male. alert, no distress and comfortable SKIN: skin color, texture, turgor are normal, no rashes or significant lesions EYES: conjunctiva are pink and non-injected, sclera clear LYMPH: No lymphadenopathy palpable in the cervical lymph nodes.  No palpable lymphadenopathy in the groin LUNGS: clear to auscultation and percussion with normal breathing  effort HEART: regular rate & rhythm and no murmurs and no lower extremity edema Musculoskeletal: no cyanosis of digits and no clubbing  PSYCH: alert & oriented x 3, fluent speech NEURO: no focal motor/sensory deficits  LABORATORY DATA:  I have reviewed the data as listed    Latest Ref Rng & Units 07/28/2022   11:09 AM 04/28/2022   10:24 AM 01/27/2022   10:40 AM  CBC  WBC 4.0 - 10.5 K/uL 5.2  4.9  5.2   Hemoglobin 13.0 - 17.0 g/dL 08.6  57.8  46.9   Hematocrit 39.0 - 52.0 % 42.8  41.7  40.4   Platelets 150 - 400 K/uL 233  206  253        Latest Ref Rng & Units 07/28/2022   11:09 AM 04/28/2022   10:24 AM 01/27/2022   10:40 AM  CMP  Glucose 70 - 99 mg/dL 629  83  92   BUN 6 - 20 mg/dL 13  16  13    Creatinine 0.61 - 1.24 mg/dL 5.28  4.13  2.44   Sodium 135 - 145 mmol/L 138  138  139   Potassium 3.5 - 5.1 mmol/L 4.3  4.5  4.1   Chloride 98 - 111 mmol/L 104  104  105   CO2 22 - 32 mmol/L 29  30  28    Calcium 8.9 - 10.3 mg/dL 9.5  9.8  9.6   Total Protein 6.5 - 8.1 g/dL 7.5  7.4  7.1   Total Bilirubin 0.3 - 1.2 mg/dL 0.6  0.8  0.5   Alkaline Phos 38 - 126 U/L 68  57  61   AST 15 - 41 U/L 16  25  16    ALT 0 - 44 U/L 9  31  10      RADIOGRAPHIC STUDIES: CT CHEST ABDOMEN PELVIS W CONTRAST  Result Date: 07/18/2022 CLINICAL DATA:  T-cell lymphoma restaging *  Tracking Code: BO * EXAM: CT CHEST, ABDOMEN, AND PELVIS WITH CONTRAST TECHNIQUE: Multidetector CT imaging of the chest, abdomen and pelvis was performed following the standard protocol during bolus administration of intravenous contrast. RADIATION DOSE REDUCTION: This exam was performed according to the departmental dose-optimization program which includes automated exposure control, adjustment of the mA and/or kV according to patient size and/or use of iterative reconstruction technique. CONTRAST:  OMNIPAQUE IOHEXOL 300 MG/ML SOLN additional oral enteric contrast COMPARISON:  01/16/2022 FINDINGS: CT CHEST FINDINGS Cardiovascular: No significant vascular findings. Normal heart size. No pericardial effusion. Mediastinum/Nodes: No enlarged mediastinal, hilar, or axillary lymph nodes. Thyroid gland, trachea, and esophagus demonstrate no significant findings. Lungs/Pleura: Minimal paraseptal emphysema. Unchanged small definitively benign nodules, including a 0.3 cm nodule of the peripheral left upper lobe (series 6, image 49) and a 0.3 cm nodule of the anterior inferior right upper lobe (series 6, image 79), benign, for which no further follow-up or characterization is required. No pleural effusion or pneumothorax. Musculoskeletal: No chest wall abnormality. No acute osseous findings. CT ABDOMEN PELVIS FINDINGS Hepatobiliary: No solid liver abnormality is seen. 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 No gallstones, gallbladder wall thickening, or biliary dilatation. Pancreas: Unremarkable. No pancreatic ductal dilatation or surrounding inflammatory changes. Spleen: Normal in size without significant abnormality. Adrenals/Urinary Tract: Adrenal glands are unremarkable. Kidneys are normal, without renal calculi, solid lesion, or hydronephrosis. Bladder is unremarkable. Stomach/Bowel: Stomach is  within normal limits. Appendix not clearly visualized.  No evidence of bowel wall thickening, distention, or inflammatory changes. Vascular/Lymphatic: Scattered aortic atherosclerosis. No enlarged abdominal or pelvic lymph nodes. Reproductive: No mass or other abnormality. Other: No abdominal wall hernia or abnormality. Similar appearance of scarring and soft tissue thickening about the anus and gluteal cleft (series 2, image 124). No ascites. Musculoskeletal: No acute osseous findings. IMPRESSION: 1. No evidence of lymphadenopathy in the chest, abdomen, or pelvis. 2. Similar appearance of scarring and soft tissue thickening about the anus and gluteal cleft, in keeping with known anal fistula. This can be further assessed by contrast enhanced anal fistula protocol MR of the pelvis if desired. 3. Minimal emphysema. Aortic Atherosclerosis (ICD10-I70.0) and Emphysema (ICD10-J43.9). Electronically Signed   By: Jearld Lesch M.D.   On: 07/18/2022 18:00    ASSESSMENT & PLAN Tanner Gallagher 57 y.o. male with no significant medical history presents for evaluation of Anaplastic T Cell Lymphoma.  Review the labs, review the records, discussion with the patient the findings are most consistent with Anaplastic T Cell Lymphoma.     PET CT scan performed on 02/24/2020 confirmed Stage III disease with involvement of the axillary and inguinal lymph nodes. He has also had a port placed and echocardiogram performed. Post treatment PET CT scan on 06/29/2020 showed Deauville 3 inguinal lymph nodes, returned to normal size. Near complete resolution of axillary adenopathy. CT scan on 01/09/2021 showed stable examination including prominent inguinal lymph nodes without pathologically enlarged lymph nodes within the chest, abdomen, or pelvis and no splenomegaly.  The treatment of choice for this patient's cancer would be BV-CHP chemotherapy.  This regimen consists of doxorubicin 50 mg per metered squared on day 1, cyclophosphamide 750 mg  per metered squared on day 1, and brentuximab 1.8 mg/kg on day 1 of a 21-day cycle.  The patient also received prednisone 60mg  PO on days 1-5.  He completed 6 cycles.   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  --port had been placed, TTE shows adequate heart function for anthracycline therapy.  --initial PET CT scan shows involvement of the inguinal/axillary lymph nodes. Findings consistent with Stage III disease.  --patient completed 6 cycles of  BV-CHP chemotherapy as noted above. Cycle 1 Day 1 of treatment started on 02/23/2020.  --post treatment PET CT scan on 06/29/2020 showed Deauville 3 inguinal lymph nodes, returned to normal size. Near complete resolution of axillary adenopathy.  Plan: --Per NCCN recommendations, will follow the patient q 6 months with labs and q 12 months for CT C/A/P x 3 years (after first 2 years of monitoring). Next CT scan due in June 2025.  --labs today show white blood cell count 5.2, Hgb 14.5, MCV 93.9, Plt 233 --no signs of symptoms concerning for recurrent disease.  --RTC in 6 months for continue monitoring.  Continue every 6 month clinic visits with yearly CT scans.  No orders of the defined types were placed in this encounter.  All questions were answered. The patient knows to call the clinic with any problems, questions or concerns.  A total of more than 25 minutes were spent on this encounter and over half of that time was spent on counseling and coordination of care as outlined above.   Ulysees Barns, MD Department of Hematology/Oncology [facility] at [facility] Phone: [phone] Pager: [phone] Email: [email or web address]  07/28/2022 2:26 PM   Literature Support:  Alycia Rossetti OA, Pro  Imagene Riches, Advani R, [place], [place], Morschhauser F, Domingo-Domenech E, Margret Chance WS, [address], [place] , [place], [place], [place] SP, Shustov A, Httmann A, [place], Yuen S, Iyer S, Zinzani PL, Hua Z, Little M, Assunta Gambles, [place] J, Manley T, Trmper L; ECHELON-2 Study Group. 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 Lancet. 2019 Jan 19;393(10168):229-240.   --Median progression-free survival was 482 months (95% CI 352-not evaluable) in the A+CHP group and 208 months (127-476) in the CHOP group (hazard ratio 071 [95% CI 054-093], p=[id number]).   --Front-line treatment with A+CHP is superior to CHOP for patients with CD30-positive peripheral T-cell lymphomas as shown by a significant improvement in progression-free survival and overall survival with a manageable safety profile.

## 2022-07-28 ENCOUNTER — Other Ambulatory Visit: Payer: Self-pay

## 2022-07-28 ENCOUNTER — Inpatient Hospital Stay (HOSPITAL_BASED_OUTPATIENT_CLINIC_OR_DEPARTMENT_OTHER): Payer: Medicaid Other | Admitting: Hematology and Oncology

## 2022-07-28 ENCOUNTER — Inpatient Hospital Stay: Payer: Medicaid Other | Attending: Hematology and Oncology

## 2022-07-28 VITALS — BP 110/68 | HR 58 | Temp 98.0°F | Resp 18 | Wt 141.7 lb

## 2022-07-28 DIAGNOSIS — Z95828 Presence of other vascular implants and grafts: Secondary | ICD-10-CM

## 2022-07-28 DIAGNOSIS — Z87891 Personal history of nicotine dependence: Secondary | ICD-10-CM | POA: Diagnosis not present

## 2022-07-28 DIAGNOSIS — C8475 Anaplastic large cell lymphoma, ALK-negative, lymph nodes of inguinal region and lower limb: Secondary | ICD-10-CM | POA: Diagnosis present

## 2022-07-28 DIAGNOSIS — C8442 Peripheral T-cell lymphoma, not classified, intrathoracic lymph nodes: Secondary | ICD-10-CM | POA: Diagnosis not present

## 2022-07-28 DIAGNOSIS — C8460 Anaplastic large cell lymphoma, ALK-positive, unspecified site: Secondary | ICD-10-CM | POA: Diagnosis not present

## 2022-07-28 DIAGNOSIS — R590 Localized enlarged lymph nodes: Secondary | ICD-10-CM | POA: Diagnosis not present

## 2022-07-28 LAB — CBC WITH DIFFERENTIAL (CANCER CENTER ONLY)
Abs Immature Granulocytes: 0.01 10*3/uL (ref 0.00–0.07)
Basophils Absolute: 0.1 10*3/uL (ref 0.0–0.1)
Basophils Relative: 1 %
Eosinophils Absolute: 0.3 10*3/uL (ref 0.0–0.5)
Eosinophils Relative: 6 %
HCT: 42.8 % (ref 39.0–52.0)
Hemoglobin: 14.5 g/dL (ref 13.0–17.0)
Immature Granulocytes: 0 %
Lymphocytes Relative: 53 %
Lymphs Abs: 2.8 10*3/uL (ref 0.7–4.0)
MCH: 31.8 pg (ref 26.0–34.0)
MCHC: 33.9 g/dL (ref 30.0–36.0)
MCV: 93.9 fL (ref 80.0–100.0)
Monocytes Absolute: 0.5 10*3/uL (ref 0.1–1.0)
Monocytes Relative: 10 %
Neutro Abs: 1.6 10*3/uL — ABNORMAL LOW (ref 1.7–7.7)
Neutrophils Relative %: 30 %
Platelet Count: 233 10*3/uL (ref 150–400)
RBC: 4.56 MIL/uL (ref 4.22–5.81)
RDW: 15.5 % (ref 11.5–15.5)
WBC Count: 5.2 10*3/uL (ref 4.0–10.5)
nRBC: 0 % (ref 0.0–0.2)

## 2022-07-28 LAB — CMP (CANCER CENTER ONLY)
ALT: 9 U/L (ref 0–44)
AST: 16 U/L (ref 15–41)
Albumin: 3.8 g/dL (ref 3.5–5.0)
Alkaline Phosphatase: 68 U/L (ref 38–126)
Anion gap: 5 (ref 5–15)
BUN: 13 mg/dL (ref 6–20)
CO2: 29 mmol/L (ref 22–32)
Calcium: 9.5 mg/dL (ref 8.9–10.3)
Chloride: 104 mmol/L (ref 98–111)
Creatinine: 0.98 mg/dL (ref 0.61–1.24)
GFR, Estimated: >60 mL/min (ref >60)
Glucose, Bld: 114 mg/dL — ABNORMAL HIGH (ref 70–99)
Potassium: 4.3 mmol/L (ref 3.5–5.1)
Sodium: 138 mmol/L (ref 135–145)
Total Bilirubin: 0.6 mg/dL (ref 0.3–1.2)
Total Protein: 7.5 g/dL (ref 6.5–8.1)

## 2022-07-28 LAB — LACTATE DEHYDROGENASE: LDH: 121 U/L (ref 98–192)

## 2022-09-13 IMAGING — US IR IMAGING GUIDED PORT INSERTION
1 series · 1 of 1 positions shown · non-contrast
Comparison: none

CLINICAL DATA: Lymphoma, access for chemotherapy

[Series 1: (id) · 1 of 1 slices shown]
[im 1/1]
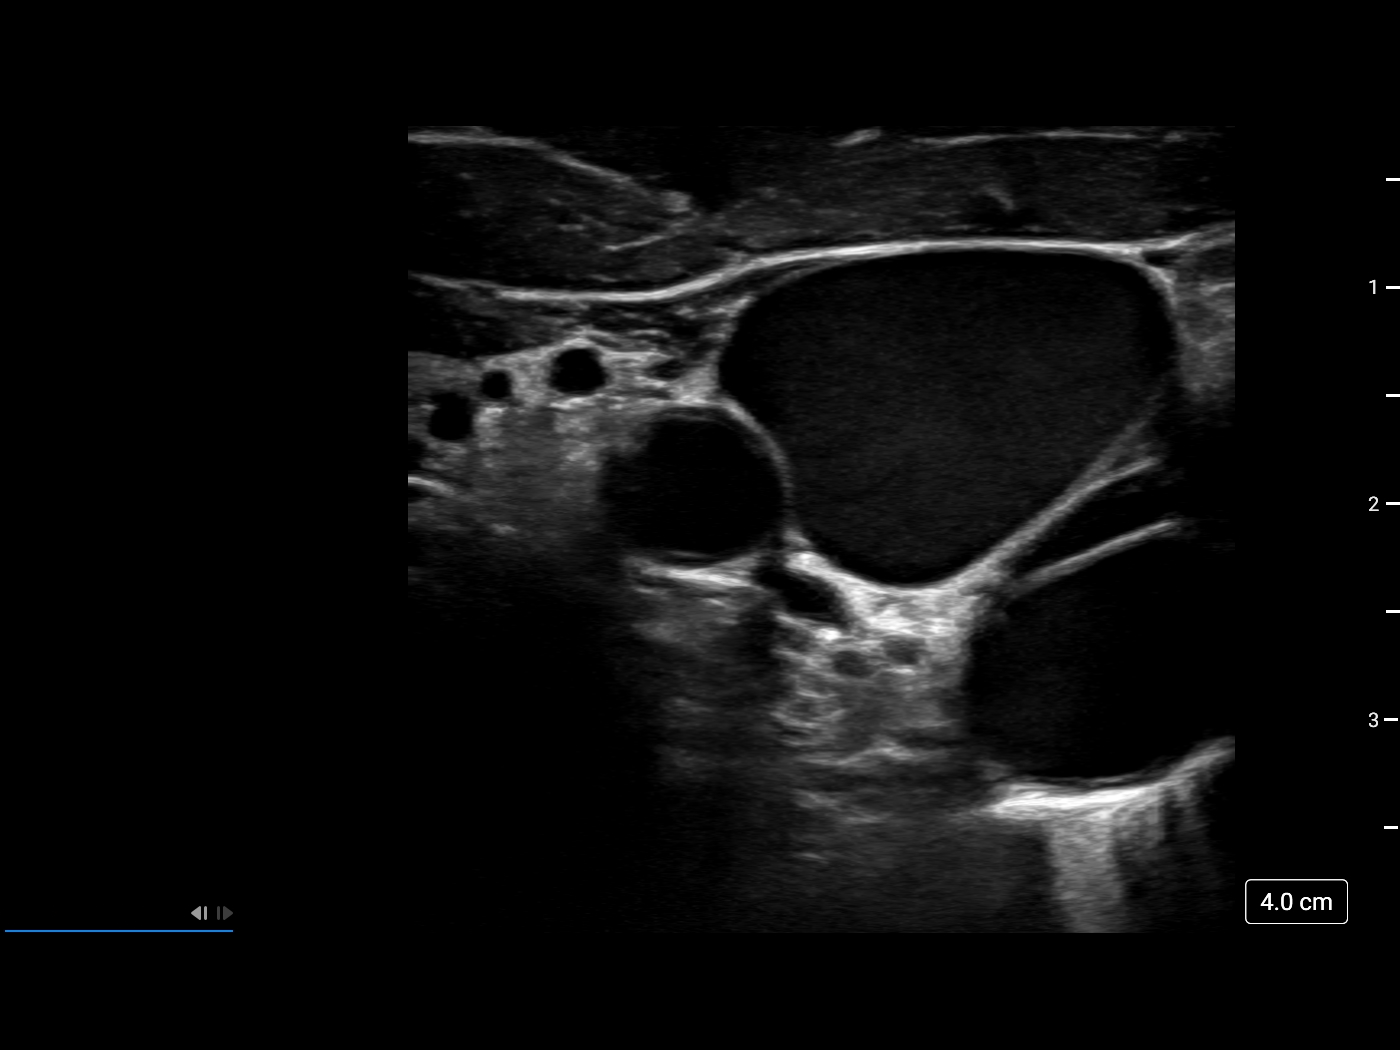

[1 of 1 positions shown; findings below may reference images not displayed]

EXAM:
RIGHT INTERNAL JUGULAR SINGLE LUMEN POWER PORT CATHETER INSERTION

Radiologist:  Tiger, Jevan

Guidance:  Ultrasound and fluoroscopic

MEDICATIONS:
Ancef 2 g; The antibiotic was administered within an appropriate
time interval prior to skin puncture.

ANESTHESIA/SEDATION:
Versed 4.0 mg IV; Fentanyl 100 mcg IV;

Moderate Sedation Time:  30 minutes

The patient was continuously monitored during the procedure by the
interventional radiology nurse under my direct supervision.

FLUOROSCOPY TIME:  One minutes, 18 seconds (7 mGy)

COMPLICATIONS:
None immediate.

CONTRAST:  None.

PROCEDURE:
Informed consent was obtained from the patient following explanation
of the procedure, risks, benefits and alternatives. The patient
understands, agrees and consents for the procedure. All questions
were addressed. A time out was performed.

Maximal barrier sterile technique utilized including caps, mask,
sterile gowns, sterile gloves, large sterile drape, hand hygiene,
and 2% chlorhexidine scrub.

Under sterile conditions and local anesthesia, right internal
jugular micropuncture venous access was performed. Access was
performed with ultrasound. Images were obtained for documentation of
the patent right internal jugular vein. A guide wire was inserted
followed by a transitional dilator. This allowed insertion of a
guide wire and catheter into the IVC. Measurements were obtained
from the SVC / RA junction back to the right IJ venotomy site. In
the right infraclavicular chest, a subcutaneous pocket was created
over the second anterior rib. This was done under sterile conditions
and local anesthesia. 1% lidocaine with epinephrine was utilized for
this. A 2.5 cm incision was made in the skin. Blunt dissection was
performed to create a subcutaneous pocket over the right pectoralis
major muscle. The pocket was flushed with saline vigorously. There
was adequate hemostasis. The port catheter was assembled and checked
for leakage. The port catheter was secured in the pocket with two
retention sutures. The tubing was tunneled subcutaneously to the
right venotomy site and inserted into the SVC/RA junction through a
valved peel-away sheath. Position was confirmed with fluoroscopy.
Images were obtained for documentation. The patient tolerated the
procedure well. No immediate complications. Incisions were closed in
a two layer fashion with 4 - 0 Vicryl suture. Dermabond was applied
to the skin. The port catheter was accessed, blood was aspirated
followed by saline and heparin flushes. Needle was removed. A dry
sterile dressing was applied.
IMPRESSION: Ultrasound and fluoroscopically guided right internal jugular single
lumen power port catheter insertion. Tip in the SVC/RA junction.
Catheter ready for use.

## 2023-01-21 ENCOUNTER — Encounter: Payer: Self-pay | Admitting: Hematology and Oncology

## 2023-01-28 ENCOUNTER — Encounter: Payer: Self-pay | Admitting: Hematology and Oncology

## 2023-01-28 ENCOUNTER — Inpatient Hospital Stay: Payer: Medicaid Other | Attending: Hematology and Oncology

## 2023-01-28 ENCOUNTER — Inpatient Hospital Stay: Payer: Medicaid Other | Admitting: Hematology and Oncology

## 2023-01-28 ENCOUNTER — Other Ambulatory Visit: Payer: Self-pay | Admitting: Hematology and Oncology

## 2023-01-28 VITALS — BP 127/77 | HR 66 | Temp 98.7°F | Resp 13 | Wt 138.6 lb

## 2023-01-28 DIAGNOSIS — C8460 Anaplastic large cell lymphoma, ALK-positive, unspecified site: Secondary | ICD-10-CM

## 2023-01-28 DIAGNOSIS — Z87891 Personal history of nicotine dependence: Secondary | ICD-10-CM | POA: Insufficient documentation

## 2023-01-28 DIAGNOSIS — C8442 Peripheral T-cell lymphoma, not classified, intrathoracic lymph nodes: Secondary | ICD-10-CM

## 2023-01-28 DIAGNOSIS — C8475 Anaplastic large cell lymphoma, ALK-negative, lymph nodes of inguinal region and lower limb: Secondary | ICD-10-CM | POA: Diagnosis present

## 2023-01-28 LAB — CBC WITH DIFFERENTIAL (CANCER CENTER ONLY)
Abs Immature Granulocytes: 0.01 10*3/uL (ref 0.00–0.07)
Basophils Absolute: 0.1 10*3/uL (ref 0.0–0.1)
Basophils Relative: 1 %
Eosinophils Absolute: 0.1 10*3/uL (ref 0.0–0.5)
Eosinophils Relative: 2 %
HCT: 50.3 % (ref 39.0–52.0)
Hemoglobin: 17.3 g/dL — ABNORMAL HIGH (ref 13.0–17.0)
Immature Granulocytes: 0 %
Lymphocytes Relative: 47 %
Lymphs Abs: 2.6 10*3/uL (ref 0.7–4.0)
MCH: 32.5 pg (ref 26.0–34.0)
MCHC: 34.4 g/dL (ref 30.0–36.0)
MCV: 94.4 fL (ref 80.0–100.0)
Monocytes Absolute: 0.6 10*3/uL (ref 0.1–1.0)
Monocytes Relative: 11 %
Neutro Abs: 2.1 10*3/uL (ref 1.7–7.7)
Neutrophils Relative %: 39 %
Platelet Count: 226 10*3/uL (ref 150–400)
RBC: 5.33 MIL/uL (ref 4.22–5.81)
RDW: 13.9 % (ref 11.5–15.5)
Smear Review: NORMAL
WBC Count: 5.6 10*3/uL (ref 4.0–10.5)
WBC Morphology: >10
nRBC: 0 % (ref 0.0–0.2)

## 2023-01-28 LAB — CMP (CANCER CENTER ONLY)
ALT: 9 U/L (ref 0–44)
AST: 15 U/L (ref 15–41)
Albumin: 4.3 g/dL (ref 3.5–5.0)
Alkaline Phosphatase: 82 U/L (ref 38–126)
Anion gap: 7 (ref 5–15)
BUN: 11 mg/dL (ref 6–20)
CO2: 28 mmol/L (ref 22–32)
Calcium: 9.9 mg/dL (ref 8.9–10.3)
Chloride: 102 mmol/L (ref 98–111)
Creatinine: 0.93 mg/dL (ref 0.61–1.24)
GFR, Estimated: >60 mL/min (ref >60)
Glucose, Bld: 84 mg/dL (ref 70–99)
Potassium: 4.4 mmol/L (ref 3.5–5.1)
Sodium: 137 mmol/L (ref 135–145)
Total Bilirubin: 0.7 mg/dL (ref 0.0–1.2)
Total Protein: 8.2 g/dL — ABNORMAL HIGH (ref 6.5–8.1)

## 2023-01-28 LAB — LACTATE DEHYDROGENASE: LDH: 163 U/L (ref 98–192)

## 2023-01-28 NOTE — Progress Notes (Signed)
 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 Previously seen in June 2021 and given course of doxycycline . Infectious workup including RPR, HIV, GC/chlamydia were negative.  2) 09/23/2019: presented to PCP with concern for bilaterally enlarged lymph nodes of the groin. Noted to have scant drainage 3) 10/12/2019: establish care with Dr. 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  5) 02/23/2020: Cycle 1 Day 1 of BV-CHP 6) 03/15/2020: Cycle 2 Day 1 of BV-CHP 7) 04/05/2020: Cycle 3 Day 1 of BV-CHP 8) 04/26/2020: Cycle 4 Day 1 of BV-CHP 9) 05/17/2020:  Cycle 5 Day 1 of BV-CHP 10) 06/07/2020: Cycle 6 Day 1 of BV-CHP 11) 06/29/2020: PET CT scan showed near complete resolution of left axillary metabolic adenopathy as well as marked reduction in the metabolic activity of the inguinal lymph nodes down to Deauville score 3.  Findings are consistent with a [place] complete metabolic response 12) 06/26/2021: CT scan showed prominent non pathologically enlarged inguinal lymph nodes, without evidence of adenopathy in the chest, abdomen or pelvis. No splenomegaly 13) 07/17/2022: CT scan showed no pathologically enlarged inguinal lymph nodes, scan is without evidence of adenopathy in the chest, abdomen or pelvis. No splenomegaly  Interval History:  Tanner Gallagher 58 y.o. male with medical history significant for Anaplastic T Cell Lymphoma who presents for a follow up visit. The patient's last visit was on 07/28/2022. In the interim since the last visit the patient has had no changes in his health.  On exam today Tanner Gallagher notes he has been great overall in the interim since her last visit.  He  reports he is had no problems in his health with no emergency room visits, hospitalizations, or new medications.  He reports he is not currently taking any medicines at all.  His energy today is about a 10 out of 10.  His appetite is strong and his weight is stable between 140 and 142.  He reports that he has not had any recent infectious symptoms such as runny nose, sore throat, or cough.  He reports the abscess on his backside has not had any draining since we last talked.  He reports that overall his health is stable and he has no evidence of lymphadenopathy in the neck, groin, or axilla. Otherwise he has no questions concerns or complaints today.  He is not having any pain, fevers, chills, sweats, nausea, vomiting or diarrhea.  A full 10 point ROS is listed below.    MEDICAL HISTORY:  Past Medical History:  Diagnosis Date   Cancer [facility])    lymph nodes   Chronic lower back pain 15 years   Medical history non-contributory    Tooth pain    no abscess or drainage per pt 02-01-2020    SURGICAL HISTORY: Past Surgical History:  Procedure Laterality Date   INCISION AND DRAINAGE PERIRECTAL ABSCESS  06/16/2017   INCISION AND DRAINAGE PERIRECTAL ABSCESS N/A 06/16/2017   Procedure: IRRIGATION AND DEBRIDEMENT PERIRECTAL ABSCESS;  Surgeon: Vernetta Berg, MD;  Location: [facility] OR;  Service: General;  Laterality: N/A;   INGUINAL HERNIA REPAIR Right 2000s   IR IMAGING GUIDED PORT INSERTION  02/15/2020   IR REMOVAL TUN ACCESS W/ PORT W/O FL MOD SED  09/14/2020   LYMPH NODE BIOPSY Right 02/02/2020   Procedure: EXCISIONAL INGUINAL LYMPH NODE BIOPSY;  Surgeon: Debby Hila, MD;  Location: [facility];  Service: General;  Laterality: Right;   PROCTOSCOPY  06/16/2017   Procedure: RIGID PROCTOSCOPY;  Surgeon: Vernetta Berg, MD;  Location: [facility] OR;  Service: General;;    SOCIAL HISTORY: Social History   Socioeconomic History   Marital status: Married    Spouse name: Not on file   Number of  children: Not on file   Years of education: Not on file   Highest education level: Not on file  Occupational History   Not on file  Tobacco Use   Smoking status: Former    Current packs/day: 0.00    Average packs/day: 0.1 packs/day for 37.0 years (4.4 ttl pk-yrs)    Types: Cigarettes    Start date: 05/20/1980    Quit date: 05/20/2017    Years since quitting: 5.6   Smokeless tobacco: Never  Vaping Use   Vaping status: Never Used  Substance and Sexual Activity   Alcohol use: Not Currently    Comment: rare   Drug use: Yes    Types: Marijuana    Comment: 02-01-2020 once a day; used in last 24 hours   Sexual activity: Yes  Other Topics Concern   Not on file  Social History Narrative   Not on file   Social Drivers of Health   Financial Resource Strain: Not on file  Food Insecurity: Not on file  Transportation Needs: Not on file  Physical Activity: Not on file  Stress: Not on file  Social Connections: Not on file  Intimate Partner Violence: Not on file    FAMILY HISTORY: Family History  Problem Relation Age of Onset   Diabetes Neg Hx     ALLERGIES:  has no known allergies.  MEDICATIONS:  No current outpatient medications on file.   No current facility-administered medications for this visit.    REVIEW OF SYSTEMS:   Constitutional: ( - ) fevers, ( - )  chills , ( - ) night sweats Eyes: ( - ) blurriness of vision, ( - ) double vision, ( - ) watery eyes Ears, nose, mouth, throat, and face: ( - ) mucositis, ( - ) sore throat Respiratory: ( - ) cough, ( - ) dyspnea, ( - ) wheezes Cardiovascular: ( - ) palpitation, ( - ) chest discomfort, ( - ) lower extremity swelling Gastrointestinal:  ( - ) nausea, ( - ) heartburn, ( - ) change in bowel habits Skin: ( - ) abnormal skin rashes Lymphatics: ( - ) new lymphadenopathy, ( - ) easy bruising Neurological: ( - ) numbness, ( - ) tingling, ( - ) new weaknesses Behavioral/Psych: ( - ) mood change, ( - ) new changes  All other  systems were reviewed with the patient and are negative.  PHYSICAL EXAMINATION: ECOG PERFORMANCE STATUS: 0 - Asymptomatic  Vitals:   01/28/23 1204  BP: 127/77  Pulse: 66  Resp: 13  Temp: 98.7 F (37.1 C)  SpO2: 100%      Filed Weights   01/28/23 1204  Weight: 138 lb 9.6 oz (62.9 kg)       GENERAL: well appearing middle aged African American male. alert, no distress and comfortable SKIN: skin color, texture, turgor are normal, no rashes or significant lesions EYES: conjunctiva are pink and non-injected, sclera clear LYMPH: No lymphadenopathy palpable in the cervical lymph nodes.  No palpable lymphadenopathy in the groin  LUNGS: clear to auscultation and percussion with normal breathing effort HEART: regular rate & rhythm and no murmurs and no lower extremity edema Musculoskeletal: no cyanosis of digits and no clubbing  PSYCH: alert & oriented x 3, fluent speech NEURO: no focal motor/sensory deficits  LABORATORY DATA:  I have reviewed the data as listed    Latest Ref Rng & Units 01/28/2023   10:45 AM 07/28/2022   11:09 AM 04/28/2022   10:24 AM  CBC  WBC 4.0 - 10.5 K/uL 5.6  5.2  4.9   Hemoglobin 13.0 - 17.0 g/dL 82.6  85.4  85.8   Hematocrit 39.0 - 52.0 % 50.3  42.8  41.7   Platelets 150 - 400 K/uL 226  233  206        Latest Ref Rng & Units 01/28/2023   10:45 AM 07/28/2022   11:09 AM 04/28/2022   10:24 AM  CMP  Glucose 70 - 99 mg/dL 84  885  83   BUN 6 - 20 mg/dL 11  13  16    Creatinine 0.61 - 1.24 mg/dL 9.06  9.01  9.05   Sodium 135 - 145 mmol/L 137  138  138   Potassium 3.5 - 5.1 mmol/L 4.4  4.3  4.5   Chloride 98 - 111 mmol/L 102  104  104   CO2 22 - 32 mmol/L 28  29  30    Calcium 8.9 - 10.3 mg/dL 9.9  9.5  9.8   Total Protein 6.5 - 8.1 g/dL 8.2  7.5  7.4   Total Bilirubin 0.0 - 1.2 mg/dL 0.7  0.6  0.8   Alkaline Phos 38 - 126 U/L 82  68  57   AST 15 - 41 U/L 15  16  25    ALT 0 - 44 U/L 9  9  31      RADIOGRAPHIC STUDIES: No results found.  ASSESSMENT &  PLAN Tanner Gallagher 58 y.o. male with no significant medical history presents for evaluation of Anaplastic T Cell Lymphoma.  Review the labs, review the records, discussion with the patient the findings are most consistent with Anaplastic T Cell Lymphoma.     PET CT scan performed on 02/24/2020 confirmed Stage III disease with involvement of the axillary and inguinal lymph nodes. He has also had a port placed and echocardiogram performed. Post treatment PET CT scan on 06/29/2020 showed Deauville 3 inguinal lymph nodes, returned to normal size. Near complete resolution of axillary adenopathy. CT scan on 01/09/2021 showed stable examination including prominent inguinal lymph nodes without pathologically enlarged lymph nodes within the chest, abdomen, or pelvis and no splenomegaly.  The treatment of choice for this patient's cancer would be BV-CHP chemotherapy.  This regimen consists of doxorubicin  50 mg per metered squared on day 1, cyclophosphamide  750 mg per metered squared on day 1, and brentuximab 1.8 mg/kg on day 1 of a 21-day cycle.  The patient also received prednisone  60mg  PO on days 1-5.  He completed 6 cycles.   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  --port had been placed, TTE shows adequate heart function for anthracycline therapy.  --initial PET CT scan shows involvement of the inguinal/axillary lymph nodes. Findings consistent with Stage III disease.  --patient completed 6 cycles of  BV-CHP chemotherapy as noted above. Cycle 1 Day 1 of treatment started on 02/23/2020.  --post treatment PET CT scan on 06/29/2020 showed Deauville  3 inguinal lymph nodes, returned to normal size. Near complete resolution of axillary adenopathy.  Plan: --Per NCCN recommendations, will follow the patient q 6 months with labs and q 12 months for CT C/A/P x 3 years (after first 2 years of monitoring). Next CT scan due in June 2025.   --labs today show white blood cell count 5.6, hemoglobin 17.3, MCV 94.4, platelets 226 --no signs of symptoms concerning for recurrent disease.  --RTC in 6 months for continue monitoring.  Continue every 6 month clinic visits with yearly CT scans.  Orders Placed This Encounter  Procedures   CT CHEST ABDOMEN PELVIS W CONTRAST    Standing Status:   Future    Expected Date:   07/20/2023    Expiration Date:   01/28/2024    If indicated for the ordered procedure, I authorize the administration of contrast media per Radiology protocol:   Yes    Does the patient have a contrast media/X-ray dye allergy?:   No    Preferred imaging location?:   [facility]    If indicated for the ordered procedure, I authorize the administration of oral contrast media per Radiology protocol:   Yes   All questions were answered. The patient knows to call the clinic with any problems, questions or concerns.  A total of more than 25 minutes were spent on this encounter and over half of that time was spent on counseling and coordination of care as outlined above.   Norleen IVAR Kidney, MD Department of Hematology/Oncology [facility] Phone: [phone] Pager: [phone] Email: [email or web address]  01/28/2023 4:14 PM   Literature Support:  Traci GORMAN Forts OA, Pro B, Illidge T, Fanale M, Advani R, Carmi, Sully, Morschhauser F, Domingo-Domenech E, Rossi G, Kim WS, Feldman T, Lennard A, Belada D, Ills , Tobinai K, Tsukasaki K, [place] SP, Shustov A, Httmann A, [place], Yuen S, Iyer S, Zinzani PL, Hua Z, Little M, Melanee GORMAN [place]wance JINNY Arthurine T, Trmper L; ECHELON-2 Study Group. Brentuximab vedotin  with chemotherapy for CD30-positive peripheral T-cell lymphoma (ECHELON-2): a global, double-blind, randomised, phase 3 trial. Lancet. 2019 Jan 19;393(10168):229-240.   --Median progression-free survival was 482 months (95% CI 352-not evaluable) in the A+CHP  group and 208 months (127-476) in the CHOP group (hazard ratio 071 [95% CI 054-093], p=[id number]).   --Front-line treatment with A+CHP is superior to CHOP for patients with CD30-positive peripheral T-cell lymphomas as shown by a significant improvement in progression-free survival and overall survival with a manageable safety profile.

## 2023-06-07 ENCOUNTER — Emergency Department (HOSPITAL_COMMUNITY)

## 2023-06-07 ENCOUNTER — Emergency Department (HOSPITAL_COMMUNITY)
Admission: EM | Admit: 2023-06-07 | Discharge: 2023-06-07 | Disposition: A | Discharge status: Home / Self Care | Attending: Emergency Medicine | Admitting: Emergency Medicine

## 2023-06-07 ENCOUNTER — Other Ambulatory Visit: Payer: Self-pay

## 2023-06-07 ENCOUNTER — Encounter (HOSPITAL_COMMUNITY): Payer: Self-pay

## 2023-06-07 DIAGNOSIS — M25562 Pain in left knee: Secondary | ICD-10-CM | POA: Diagnosis not present

## 2023-06-07 DIAGNOSIS — M25551 Pain in right hip: Secondary | ICD-10-CM | POA: Diagnosis present

## 2023-06-07 DIAGNOSIS — M160 Bilateral primary osteoarthritis of hip: Secondary | ICD-10-CM | POA: Diagnosis not present

## 2023-06-07 DIAGNOSIS — Z8572 Personal history of non-Hodgkin lymphomas: Secondary | ICD-10-CM | POA: Diagnosis not present

## 2023-06-07 DIAGNOSIS — M25561 Pain in right knee: Secondary | ICD-10-CM | POA: Insufficient documentation

## 2023-06-07 LAB — CBC WITH DIFFERENTIAL/PLATELET
Abs Immature Granulocytes: 0.01 10*3/uL (ref 0.00–0.07)
Basophils Absolute: 0 10*3/uL (ref 0.0–0.1)
Basophils Relative: 1 %
Eosinophils Absolute: 0.2 10*3/uL (ref 0.0–0.5)
Eosinophils Relative: 4 %
HCT: 40.6 % (ref 39.0–52.0)
Hemoglobin: 13.8 g/dL (ref 13.0–17.0)
Immature Granulocytes: 0 %
Lymphocytes Relative: 46 %
Lymphs Abs: 2.2 10*3/uL (ref 0.7–4.0)
MCH: 32.7 pg (ref 26.0–34.0)
MCHC: 34 g/dL (ref 30.0–36.0)
MCV: 96.2 fL (ref 80.0–100.0)
Monocytes Absolute: 0.5 10*3/uL (ref 0.1–1.0)
Monocytes Relative: 10 %
Neutro Abs: 1.8 10*3/uL (ref 1.7–7.7)
Neutrophils Relative %: 39 %
Platelets: 242 10*3/uL (ref 150–400)
RBC: 4.22 MIL/uL (ref 4.22–5.81)
RDW: 14.7 % (ref 11.5–15.5)
WBC: 4.7 10*3/uL (ref 4.0–10.5)
nRBC: 0 % (ref 0.0–0.2)

## 2023-06-07 LAB — COMPREHENSIVE METABOLIC PANEL WITH GFR
ALT: 12 U/L (ref 0–44)
AST: 21 U/L (ref 15–41)
Albumin: 3.5 g/dL (ref 3.5–5.0)
Alkaline Phosphatase: 51 U/L (ref 38–126)
Anion gap: 9 (ref 5–15)
BUN: 15 mg/dL (ref 6–20)
CO2: 26 mmol/L (ref 22–32)
Calcium: 9.3 mg/dL (ref 8.9–10.3)
Chloride: 103 mmol/L (ref 98–111)
Creatinine, Ser: 0.88 mg/dL (ref 0.61–1.24)
GFR, Estimated: >60 mL/min (ref >60)
Glucose, Bld: 117 mg/dL — ABNORMAL HIGH (ref 70–99)
Potassium: 3.8 mmol/L (ref 3.5–5.1)
Sodium: 138 mmol/L (ref 135–145)
Total Bilirubin: 1 mg/dL (ref 0.0–1.2)
Total Protein: 7.3 g/dL (ref 6.5–8.1)

## 2023-06-07 MED ORDER — PREDNISONE 10 MG PO TABS
ORAL_TABLET | ORAL | 0 refills | Status: AC
  Filled 2023-06-07 – 2023-06-08 (×2): qty 42, 12d supply, fill #0

## 2023-06-07 MED ORDER — LIDOCAINE 5 % EX PTCH
1.0000 | MEDICATED_PATCH | CUTANEOUS | 0 refills | Status: AC
  Filled 2023-06-07: qty 30, 30d supply, fill #0

## 2023-06-07 MED ORDER — METHOCARBAMOL 500 MG PO TABS
500.0000 mg | ORAL_TABLET | Freq: Two times a day (BID) | ORAL | 0 refills | Status: AC
  Filled 2023-06-07: qty 20, 10d supply, fill #0

## 2023-06-07 NOTE — ED Provider Notes (Signed)
 [facility]ER[facility]ENC[facility] DEPARTMENT AT [facility] Provider Note   CSN: [id number] Arrival date & time: 06/07/23  1156     History  Chief Complaint  Patient presents with   Bil hip pain   Bil Knee pain    Armand Preast is a 58 y.o. male past medical history significant for anaplastic T-cell lymphoma in remission who presents concern for bilateral hip and knee pain worse on the left side, denies any recent fall, injuries, no previously diagnosed history of arthritis.  Reports pain has been ongoing for several weeks.  Rates pain 7/10 at time of triage. Denies new numbness, tingling. No hx of IVDU, recent fever, loss of control of bladder, bowels. Mild improvement with tylenol , ibuprofen .   HPI     Home Medications Prior to Admission medications   Medication Sig Start Date End Date Taking? Authorizing Provider  lidocaine  (LIDODERM ) 5 % Place 1 patch onto the skin daily. Remove & Discard patch within 12 hours or as directed by MD 06/07/23  Yes Nitza Schmid H, PA-C  methocarbamol  (ROBAXIN ) 500 MG tablet Take 1 tablet (500 mg total) by mouth 2 (two) times daily. 06/07/23  Yes Rodriques Badie H, PA-C  predniSONE  (STERAPRED UNI-PAK 21 TAB) 10 MG (21) TBPK tablet Take by mouth daily. Take 6 tabs by mouth daily  for 2 days, then 5 tabs for 2 days, then 4 tabs for 2 days, then 3 tabs for 2 days, 2 tabs for 2 days, then 1 tab by mouth daily for 2 days 06/07/23  Yes Yina Riviere H, PA-C      Allergies    Patient has no known allergies.    Review of Systems   Review of Systems  All other systems reviewed and are negative.   Physical Exam Updated Vital Signs BP 118/85 (BP Location: Right Arm)   Pulse 64   Temp 98.1 F (36.7 C) (Oral)   Resp 18   Ht 5\' 8"  (1.727 m)   Wt 68 kg   SpO2 98%   BMI 22.81 kg/m  Physical Exam Vitals and nursing note reviewed.  Constitutional:      General: He is not in acute distress.    Appearance: Normal appearance.  HENT:      Head: Normocephalic and atraumatic.  Eyes:     General:        Right eye: No discharge.        Left eye: No discharge.  Cardiovascular:     Rate and Rhythm: Normal rate and regular rhythm.     Heart sounds: No murmur heard.    No friction rub. No gallop.  Pulmonary:     Effort: Pulmonary effort is normal.     Breath sounds: Normal breath sounds.  Abdominal:     General: Bowel sounds are normal.     Palpations: Abdomen is soft.  Musculoskeletal:     Comments: Focal ttp without stepoff or deformity bilatearl hips, left greater than right. Normal range of motion to flexion, extension of bilateral hips, knees. Able to ambulate without significant difficulty.  Skin:    General: Skin is warm and dry.     Capillary Refill: Capillary refill takes less than 2 seconds.  Neurological:     Mental Status: He is alert and oriented to person, place, and time.  Psychiatric:        Mood and Affect: Mood normal.        Behavior: Behavior normal.     ED Results /  Procedures / Treatments   Labs (all labs ordered are listed, but only abnormal results are displayed) Labs Reviewed  COMPREHENSIVE METABOLIC PANEL WITH GFR - Abnormal; Notable for the following components:      Result Value   Glucose, Bld 117 (*)    All other components within normal limits  CBC WITH DIFFERENTIAL/PLATELET    EKG None  Radiology DG Knee Complete 4 Views Left Result Date: 06/07/2023 CLINICAL DATA:  pain EXAM: LEFT KNEE - COMPLETE 4+ VIEW COMPARISON:  None Available. FINDINGS: No acute fracture or dislocation. Joint spaces and alignment are relatively maintained. Enthesopathic changes along the patella. No area of erosion or osseous destruction. No unexpected radiopaque foreign body. Soft tissues are unremarkable. IMPRESSION: No acute fracture or dislocation. Electronically Signed   By: Clancy Crimes M.D.   On: 06/07/2023 13:07   DG Pelvis 1-2 Views Result Date: 06/07/2023 CLINICAL DATA:  pain EXAM: PELVIS -  1-2 VIEW COMPARISON:  July 17, 2022 FINDINGS: No acute fracture or dislocation. There are moderate to severe bilateral hip degenerative changes with extensive subcortical cystic change of the femoral head and acetabulum as well as subcortical sclerosis of the acetabulum. Osteophyte proliferation. Similar mild irregularity along the LEFT sacroiliac joint which may reflect asymmetric degenerative changes versus sequela of prior sacroiliitis. Pelvic phleboliths. IMPRESSION: Moderate to severe bilateral hip degenerative changes. If there is a persistent clinical concern for nondisplaced hip or pelvic fracture, recommend dedicated pelvic CT or MRI. Electronically Signed   By: Clancy Crimes M.D.   On: 06/07/2023 13:06    Procedures Procedures    Medications Ordered in ED Medications - No data to display  ED Course/ Medical Decision Making/ A&P                                 Medical Decision Making   This patient is a 58 y.o. male who presents to the ED for concern of bilateral hip and knee pain left worse than right, no history of arthritis, been going on for several weeks to months but worse over the last few days..   Differential diagnoses prior to evaluation: Fracture, dislocation, chronic pain, arthritis, radiating sciatica, versus other.  Past Medical History / Social History / Additional history: Chart reviewed. Pertinent results include: Previous history of cancer currently in remission, otherwise overall noncontributory  Physical Exam: Physical exam performed. The pertinent findings include: Neurovascularly intact throughout, Focal ttp without stepoff or deformity bilatearl hips, left greater than right. Normal range of motion to flexion, extension of bilateral hips, knees. Able to ambulate without significant difficulty.   Medications / Treatment: Given the chronicity of the pain and encouraged steroid course, muscle relaxant, lidocaine  patches, close orthopedic follow-up.   Patient understands agrees to plan, discharged in stable condition.   Disposition: After consideration of the diagnostic results and the patients response to treatment, I feel that patient is stable for discharge with plan as above .   emergency department workup does not suggest an emergent condition requiring admission or immediate intervention beyond what has been performed at this time. The plan is: as above. The patient is safe for discharge and has been instructed to return immediately for worsening symptoms, change in symptoms or any other concerns.  Final Clinical Impression(s) / ED Diagnoses Final diagnoses:  Bilateral hip joint arthritis  Acute pain of both knees    Rx / DC Orders ED Discharge Orders  Ordered    predniSONE  (STERAPRED UNI-PAK 21 TAB) 10 MG (21) TBPK tablet  Daily        06/07/23 1513    methocarbamol  (ROBAXIN ) 500 MG tablet  2 times daily        06/07/23 1513    lidocaine  (LIDODERM ) 5 %  Every 24 hours        06/07/23 1513              Jaleeah Slight, Alford H, PA-C 06/07/23 1517    Afton Horse T, DO 06/08/23 2243

## 2023-06-07 NOTE — Discharge Instructions (Signed)

## 2023-06-07 NOTE — ED Triage Notes (Signed)
 Pt came in via POV d/t bil hip & knee pain, hurts worse on the Lt side. Denies recent falls/injuries or Hx of arthritis. A/Ox4, rates his pain 7/10 during triage.

## 2023-06-07 NOTE — ED Provider Triage Note (Signed)
 Emergency Medicine Provider Triage Evaluation Note  Tanner Gallagher , a 58 y.o. male  was evaluated in triage.  Pt complains of pain in his left knee and both hips.  Pt has a histroy of lymphoma  Review of Systems  Positive: Left knee pain Negative: Fever or chills   Physical Exam  BP 96/64   Pulse 70   Temp 98.3 F (36.8 C)   Resp 16   Ht 5\' 8"  (1.727 m)   Wt 68 kg   SpO2 100%   BMI 22.81 kg/m  Gen:   Awake, no distress   Resp:  Normal effort  MSK:   Pain left knee and both hips to movement Other:    Medical Decision Making  Medically screening exam initiated at 12:27 PM.  Appropriate orders placed.  Tanner Gallagher was informed that the remainder of the evaluation will be completed by another provider, this initial triage assessment does not replace that evaluation, and the importance of remaining in the ED until their evaluation is complete.     Sandi Crosby, PA-C 06/07/23 1230

## 2023-06-08 ENCOUNTER — Other Ambulatory Visit: Payer: Self-pay

## 2023-06-08 ENCOUNTER — Encounter: Payer: Self-pay | Admitting: Hematology and Oncology

## 2023-06-10 ENCOUNTER — Other Ambulatory Visit: Payer: Self-pay

## 2023-06-16 ENCOUNTER — Other Ambulatory Visit: Payer: Self-pay

## 2023-07-20 ENCOUNTER — Ambulatory Visit (HOSPITAL_COMMUNITY)
Admission: RE | Admit: 2023-07-20 | Discharge: 2023-07-20 | Disposition: A | Discharge status: Home / Self Care | Payer: MEDICAID | Source: Ambulatory Visit | Attending: Hematology and Oncology | Admitting: Hematology and Oncology

## 2023-07-20 DIAGNOSIS — C8442 Peripheral T-cell lymphoma, not classified, intrathoracic lymph nodes: Secondary | ICD-10-CM | POA: Diagnosis present

## 2023-07-20 MED ORDER — IOHEXOL 300 MG/ML  SOLN
100.0000 mL | Freq: Once | INTRAMUSCULAR | Status: AC | PRN
  Administered 2023-07-20: 100 mL via INTRAVENOUS

## 2023-07-28 ENCOUNTER — Other Ambulatory Visit: Payer: Self-pay | Admitting: Hematology and Oncology

## 2023-07-28 DIAGNOSIS — C8442 Peripheral T-cell lymphoma, not classified, intrathoracic lymph nodes: Secondary | ICD-10-CM

## 2023-07-28 NOTE — Progress Notes (Unsigned)
 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 Previously seen in June 2021 and given course of doxycycline . Infectious workup including RPR, HIV, GC/chlamydia were negative.  2) 09/23/2019: presented to PCP with concern for bilaterally enlarged lymph nodes of the groin. Noted to have scant drainage 3) 10/12/2019: establish care with Dr. 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  5) 02/23/2020: Cycle 1 Day 1 of BV-CHP 6) 03/15/2020: Cycle 2 Day 1 of BV-CHP 7) 04/05/2020: Cycle 3 Day 1 of BV-CHP 8) 04/26/2020: Cycle 4 Day 1 of BV-CHP 9) 05/17/2020:  Cycle 5 Day 1 of BV-CHP 10) 06/07/2020: Cycle 6 Day 1 of BV-CHP 11) 06/29/2020: PET CT scan showed near complete resolution of left axillary metabolic adenopathy as well as marked reduction in the metabolic activity of the inguinal lymph nodes down to Deauville score 3.  Findings are consistent with a [place] complete metabolic response 12) 06/26/2021: CT scan showed prominent non pathologically enlarged inguinal lymph nodes, without evidence of adenopathy in the chest, abdomen or pelvis. No splenomegaly 13) 07/17/2022: CT scan showed no pathologically enlarged inguinal lymph nodes, scan is without evidence of adenopathy in the chest, abdomen or pelvis. No splenomegaly  Interval History:  Tanner Gallagher 58 y.o. male with medical history significant for Anaplastic T Cell Lymphoma who presents for a follow up visit. The patient's last visit was on 01/28/2023. In the interim since the last visit the patient has had no changes in his health.  On exam today Tanner Gallagher notes he has been well overall in the interim since her last visit.  He  spending a lot of time with his grandchildren with is keeping him active.  He notes he is had no changes in his health.  He reports no new meds or hospitalizations.  He does have his chronic hip pain from arthritis and recently received cortisone shots which helped a little.  He notes that he has had no issues with nausea, vomiting, or diarrhea.  He denies any bumps or lumps concerning for lymphadenopathy.  He has had no unexpected weight loss, fevers, chills, sweats.  Overall he feels quite well.  He denies any signs or symptoms concerning for recurrent lymphoma.  A full 10 point ROS is otherwise negative.  MEDICAL HISTORY:  Past Medical History:  Diagnosis Date   Cancer [facility])    lymph nodes   Chronic lower back pain 15 years   Medical history non-contributory    Tooth pain    no abscess or drainage per pt 02-01-2020    SURGICAL HISTORY: Past Surgical History:  Procedure Laterality Date   INCISION AND DRAINAGE PERIRECTAL ABSCESS  06/16/2017   INCISION AND DRAINAGE PERIRECTAL ABSCESS N/A 06/16/2017   Procedure: IRRIGATION AND DEBRIDEMENT PERIRECTAL ABSCESS;  Surgeon: Vernetta Berg, MD;  Location: [facility] OR;  Service: General;  Laterality: N/A;   INGUINAL HERNIA REPAIR Right 2000s   IR IMAGING GUIDED PORT INSERTION  02/15/2020   IR REMOVAL TUN ACCESS W/ PORT W/O FL MOD SED  09/14/2020   LYMPH NODE BIOPSY Right 02/02/2020   Procedure: EXCISIONAL INGUINAL LYMPH NODE BIOPSY;  Surgeon: Debby Hila, MD;  Location: [facility];  Service: General;  Laterality: Right;  PROCTOSCOPY  06/16/2017   Procedure: RIGID PROCTOSCOPY;  Surgeon: Vernetta Berg, MD;  Location: [facility] OR;  Service: General;;    SOCIAL HISTORY: Social History   Socioeconomic History   Marital status: Married    Spouse name: Not on file   Number of children: Not on file   Years of education: Not on file   Highest education level: Not on file  Occupational History   Not on file  Tobacco Use   Smoking  status: Former    Current packs/day: 0.00    Average packs/day: 0.1 packs/day for 37.0 years (4.4 ttl pk-yrs)    Types: Cigarettes    Start date: 05/20/1980    Quit date: 05/20/2017    Years since quitting: 6.1   Smokeless tobacco: Never  Vaping Use   Vaping status: Never Used  Substance and Sexual Activity   Alcohol use: Not Currently    Comment: rare   Drug use: Yes    Types: Marijuana    Comment: 02-01-2020 once a day; used in last 24 hours   Sexual activity: Yes  Other Topics Concern   Not on file  Social History Narrative   Not on file   Social Drivers of Health   Financial Resource Strain: Not on file  Food Insecurity: Not on file  Transportation Needs: Not on file  Physical Activity: Not on file  Stress: Not on file  Social Connections: Not on file  Intimate Partner Violence: Not on file    FAMILY HISTORY: Family History  Problem Relation Age of Onset   Diabetes Neg Hx     ALLERGIES:  has no known allergies.  MEDICATIONS:  Current Outpatient Medications  Medication Sig Dispense Refill   lidocaine  (LIDODERM ) 5 % Place 1 patch onto the skin daily. Remove & Discard patch within 12 hours or as directed by MD 30 patch 0   methocarbamol  (ROBAXIN ) 500 MG tablet Take 1 tablet (500 mg total) by mouth 2 (two) times daily. 20 tablet 0   No current facility-administered medications for this visit.    REVIEW OF SYSTEMS:   Constitutional: ( - ) fevers, ( - )  chills , ( - ) night sweats Eyes: ( - ) blurriness of vision, ( - ) double vision, ( - ) watery eyes Ears, nose, mouth, throat, and face: ( - ) mucositis, ( - ) sore throat Respiratory: ( - ) cough, ( - ) dyspnea, ( - ) wheezes Cardiovascular: ( - ) palpitation, ( - ) chest discomfort, ( - ) lower extremity swelling Gastrointestinal:  ( - ) nausea, ( - ) heartburn, ( - ) change in bowel habits Skin: ( - ) abnormal skin rashes Lymphatics: ( - ) new lymphadenopathy, ( - ) easy bruising Neurological: ( - ) numbness,  ( - ) tingling, ( - ) new weaknesses Behavioral/Psych: ( - ) mood change, ( - ) new changes  All other systems were reviewed with the patient and are negative.  PHYSICAL EXAMINATION: ECOG PERFORMANCE STATUS: 0 - Asymptomatic  There were no vitals filed for this visit.     There were no vitals filed for this visit.      GENERAL: well appearing middle aged [place] American male. alert, no distress and comfortable SKIN: skin color, texture, turgor are normal, no rashes or significant lesions EYES: conjunctiva are pink and non-injected, sclera clear LYMPH: No lymphadenopathy palpable in the cervical lymph nodes.  No palpable lymphadenopathy in the groin LUNGS: clear to auscultation and percussion  with normal breathing effort HEART: regular rate & rhythm and no murmurs and no lower extremity edema Musculoskeletal: no cyanosis of digits and no clubbing  PSYCH: alert & oriented x 3, fluent speech NEURO: no focal motor/sensory deficits  LABORATORY DATA:  I have reviewed the data as listed    Latest Ref Rng & Units 06/07/2023   12:46 PM 01/28/2023   10:45 AM 07/28/2022   11:09 AM  CBC  WBC 4.0 - 10.5 K/uL 4.7  5.6  5.2   Hemoglobin 13.0 - 17.0 g/dL 86.1  82.6  85.4   Hematocrit 39.0 - 52.0 % 40.6  50.3  42.8   Platelets 150 - 400 K/uL 242  226  233        Latest Ref Rng & Units 06/07/2023   12:46 PM 01/28/2023   10:45 AM 07/28/2022   11:09 AM  CMP  Glucose 70 - 99 mg/dL 882  84  885   BUN 6 - 20 mg/dL 15  11  13    Creatinine 0.61 - 1.24 mg/dL 9.11  9.06  9.01   Sodium 135 - 145 mmol/L 138  137  138   Potassium 3.5 - 5.1 mmol/L 3.8  4.4  4.3   Chloride 98 - 111 mmol/L 103  102  104   CO2 22 - 32 mmol/L 26  28  29    Calcium 8.9 - 10.3 mg/dL 9.3  9.9  9.5   Total Protein 6.5 - 8.1 g/dL 7.3  8.2  7.5   Total Bilirubin 0.0 - 1.2 mg/dL 1.0  0.7  0.6   Alkaline Phos 38 - 126 U/L 51  82  68   AST 15 - 41 U/L 21  15  16    ALT 0 - 44 U/L 12  9  9      RADIOGRAPHIC STUDIES: CT CHEST  ABDOMEN PELVIS W CONTRAST Result Date: 07/21/2023 CLINICAL DATA:  Peripheral T-cell lymphoma. Intrathoracic lymph nodes. * Tracking Code: BO * EXAM: CT CHEST, ABDOMEN, AND PELVIS WITH CONTRAST TECHNIQUE: Multidetector CT imaging of the chest, abdomen and pelvis was performed following the standard protocol during bolus administration of intravenous contrast. RADIATION DOSE REDUCTION: This exam was performed according to the departmental dose-optimization program which includes automated exposure control, adjustment of the mA and/or kV according to patient size and/or use of iterative reconstruction technique. CONTRAST:  OMNIPAQUE  IOHEXOL  300 MG/ML  SOLN COMPARISON:  CT 07/17/2022, 06/29/2020 FINDINGS: CT CHEST FINDINGS Cardiovascular: No significant vascular findings. Normal heart size. No pericardial effusion. Mediastinum/Nodes: No axillary or supraclavicular adenopathy. No mediastinal or hilar adenopathy. No pericardial fluid. Esophagus normal. Lungs/Pleura: Tiny nodule in the LEFT upper lobe measuring 3 mm on 51/4 is unchanged. No suspicious pulmonary nodules. Musculoskeletal: No aggressive osseous lesion. CT ABDOMEN AND PELVIS FINDINGS Hepatobiliary: Hemangioma in the posterior RIGHT hepatic lobe is again noted measuring 2 cm. Enhancing lesion inferior RIGHT hepatic lobe (segment 4B) measuring 19 mm is present on CT 01/08/2022. Findings consistent flash hemangioma. Similar flash filling hemangioma in segment 2 unchanged. Pancreas: Pancreas is normal. No ductal dilatation. No pancreatic inflammation. Spleen: Normal spleen Adrenals/urinary tract: Adrenal glands and kidneys are normal. The ureters and bladder normal. Stomach/Bowel: Stomach, small bowel, appendix, and cecum are normal. The colon and rectosigmoid colon are normal. Vascular/Lymphatic: Abdominal aorta is normal caliber. There is no retroperitoneal or periportal lymphadenopathy. No pelvic lymphadenopathy. 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 There is gas within the LEFT fistulous tract (  image 123/2. Musculoskeletal: No aggressive osseous lesion. IMPRESSION: CHEST: 1. No evidence of lymphoma in the chest. 2. Stable tiny nodule in the LEFT upper lobe. PELVIS: 1. No evidence of lymphoma in the abdomen pelvis. 2. Normal spleen. 3. Stable hepatic hemangiomas. 4. 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 Gas within the LEFT fistulous tract. Electronically Signed   By: Jackquline Boxer M.D.   On: 07/21/2023 09:01    ASSESSMENT & PLAN Tanner Gallagher 58 y.o. male with no significant medical history presents for evaluation of Anaplastic T Cell Lymphoma.  Review the labs, review the records, discussion with the patient the findings are most consistent with Anaplastic T Cell Lymphoma.     PET CT scan performed on 02/24/2020 confirmed Stage III disease with involvement of the axillary and inguinal lymph nodes. He has also had a port placed and echocardiogram performed. Post treatment PET CT scan on 06/29/2020 showed Deauville 3 inguinal lymph nodes, returned to normal size. Near complete resolution of axillary adenopathy. CT scan on 01/09/2021 showed stable examination including prominent inguinal lymph nodes without pathologically enlarged lymph nodes within the chest, abdomen, or pelvis and no splenomegaly.  The treatment of choice for this patient's cancer would be BV-CHP chemotherapy.  This regimen consists of doxorubicin  50 mg per metered squared on day 1, cyclophosphamide  750 mg per metered squared on day 1, and brentuximab 1.8 mg/kg on day 1 of a 21-day cycle.  The patient also received prednisone  60mg  PO on days 1-5.  He completed 6 cycles.   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  --port had been placed, TTE shows adequate heart function for anthracycline therapy.  --initial  PET CT scan shows involvement of the inguinal/axillary lymph nodes. Findings consistent with Stage III disease.  --patient completed 6 cycles of  BV-CHP chemotherapy as noted above. Cycle 1 Day 1 of treatment started on 02/23/2020.  --post treatment PET CT scan on 06/29/2020 showed Deauville 3 inguinal lymph nodes, returned to normal size. Near complete resolution of axillary adenopathy.  Plan: --Per NCCN recommendations, will follow the patient q 6 months with labs and q 12 months for CT C/A/P x 3 years (after first 2 years of monitoring). Next CT scan due in June 2026.  --labs today show white blood cell count 5.7, Hgb 14.5, MCV 93.0, Plt 241  --no signs of symptoms concerning for recurrent disease.  --RTC in 6 months for continue monitoring.  Continue every 6 month clinic visits with yearly CT scans.  No orders of the defined types were placed in this encounter.  All questions were answered. The patient knows to call the clinic with any problems, questions or concerns.  A total of more than 25 minutes were spent on this encounter and over half of that time was spent on counseling and coordination of care as outlined above.   Norleen IVAR Kidney, MD Department of Hematology/Oncology [facility] Phone: [phone] Pager: [phone] Email: [email or web address]  07/28/2023 7:58 PM   Literature Support:  Traci GORMAN Forts OA, Pro B, Illidge T, Fanale M, Advani R, [place], [place], Morschhauser F, Domingo-Domenech E, Rossi G, Kim WS, Feldman T, Lennard A, Belada D, Ills , Tobinai K, Tsukasaki K, Yeh SP, Shustov A, Httmann A, Savage KJ, Yuen S, Iyer S, Zinzani PL, Hua Z, Little M, Melanee GORMAN Marina J, Manley T, Trmper L;  ECHELON-2 Study Group. Brentuximab vedotin  with chemotherapy for CD30-positive peripheral T-cell lymphoma (ECHELON-2): a global, double-blind, randomised, phase 3 trial. Lancet. 2019 Jan 19;393(10168):229-240.   --Median  progression-free survival was 482 months (95% CI 352-not evaluable) in the A+CHP group and 208 months (127-476) in the CHOP group (hazard ratio 071 [95% CI 054-093], p=[id number]).   --Front-line treatment with A+CHP is superior to CHOP for patients with CD30-positive peripheral T-cell lymphomas as shown by a significant improvement in progression-free survival and overall survival with a manageable safety profile.

## 2023-07-29 ENCOUNTER — Inpatient Hospital Stay (HOSPITAL_BASED_OUTPATIENT_CLINIC_OR_DEPARTMENT_OTHER): Payer: MEDICAID | Admitting: Hematology and Oncology

## 2023-07-29 ENCOUNTER — Inpatient Hospital Stay: Payer: MEDICAID | Attending: Hematology and Oncology

## 2023-07-29 VITALS — BP 107/61 | HR 62 | Temp 97.8°F | Resp 16 | Ht 68.0 in | Wt 136.3 lb

## 2023-07-29 DIAGNOSIS — Z87891 Personal history of nicotine dependence: Secondary | ICD-10-CM | POA: Insufficient documentation

## 2023-07-29 DIAGNOSIS — C8460 Anaplastic large cell lymphoma, ALK-positive, unspecified site: Secondary | ICD-10-CM

## 2023-07-29 DIAGNOSIS — C844A Peripheral t-cell lymphoma, not elsewhere classified, in remission: Secondary | ICD-10-CM | POA: Insufficient documentation

## 2023-07-29 DIAGNOSIS — Z9221 Personal history of antineoplastic chemotherapy: Secondary | ICD-10-CM | POA: Insufficient documentation

## 2023-07-29 DIAGNOSIS — C8442 Peripheral T-cell lymphoma, not classified, intrathoracic lymph nodes: Secondary | ICD-10-CM | POA: Diagnosis not present

## 2023-07-29 LAB — CBC WITH DIFFERENTIAL (CANCER CENTER ONLY)
Abs Immature Granulocytes: 0.01 K/uL (ref 0.00–0.07)
Basophils Absolute: 0.1 K/uL (ref 0.0–0.1)
Basophils Relative: 1 %
Eosinophils Absolute: 0.2 K/uL (ref 0.0–0.5)
Eosinophils Relative: 3 %
HCT: 42.5 % (ref 39.0–52.0)
Hemoglobin: 14.5 g/dL (ref 13.0–17.0)
Immature Granulocytes: 0 %
Lymphocytes Relative: 47 %
Lymphs Abs: 2.6 K/uL (ref 0.7–4.0)
MCH: 31.7 pg (ref 26.0–34.0)
MCHC: 34.1 g/dL (ref 30.0–36.0)
MCV: 93 fL (ref 80.0–100.0)
Monocytes Absolute: 0.5 K/uL (ref 0.1–1.0)
Monocytes Relative: 8 %
Neutro Abs: 2.4 K/uL (ref 1.7–7.7)
Neutrophils Relative %: 41 %
Platelet Count: 241 K/uL (ref 150–400)
RBC: 4.57 MIL/uL (ref 4.22–5.81)
RDW: 14 % (ref 11.5–15.5)
WBC Count: 5.7 K/uL (ref 4.0–10.5)
nRBC: 0 % (ref 0.0–0.2)

## 2023-07-29 LAB — CMP (CANCER CENTER ONLY)
ALT: 14 U/L (ref 0–44)
AST: 18 U/L (ref 15–41)
Albumin: 4.1 g/dL (ref 3.5–5.0)
Alkaline Phosphatase: 51 U/L (ref 38–126)
Anion gap: 5 (ref 5–15)
BUN: 19 mg/dL (ref 6–20)
CO2: 31 mmol/L (ref 22–32)
Calcium: 9.3 mg/dL (ref 8.9–10.3)
Chloride: 105 mmol/L (ref 98–111)
Creatinine: 0.97 mg/dL (ref 0.61–1.24)
GFR, Estimated: >60 mL/min (ref >60)
Glucose, Bld: 91 mg/dL (ref 70–99)
Potassium: 4.9 mmol/L (ref 3.5–5.1)
Sodium: 141 mmol/L (ref 135–145)
Total Bilirubin: 0.5 mg/dL (ref 0.0–1.2)
Total Protein: 7.2 g/dL (ref 6.5–8.1)

## 2023-07-29 LAB — LACTATE DEHYDROGENASE: LDH: 161 U/L (ref 98–192)

## 2024-02-02 ENCOUNTER — Other Ambulatory Visit: Payer: Self-pay | Admitting: Hematology and Oncology

## 2024-02-02 DIAGNOSIS — C8442 Peripheral T-cell lymphoma, not classified, intrathoracic lymph nodes: Secondary | ICD-10-CM

## 2024-02-02 NOTE — Progress Notes (Unsigned)
 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 Previously seen in June 2021 and given course of doxycycline . Infectious workup including RPR, HIV, GC/chlamydia were negative.  2) 09/23/2019: presented to PCP with concern for bilaterally enlarged lymph nodes of the groin. Noted to have scant drainage 3) 10/12/2019: establish care with Dr. 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  5) 02/23/2020: Cycle 1 Day 1 of BV-CHP 6) 03/15/2020: Cycle 2 Day 1 of BV-CHP 7) 04/05/2020: Cycle 3 Day 1 of BV-CHP 8) 04/26/2020: Cycle 4 Day 1 of BV-CHP 9) 05/17/2020:  Cycle 5 Day 1 of BV-CHP 10) 06/07/2020: Cycle 6 Day 1 of BV-CHP 11) 06/29/2020: PET CT scan showed near complete resolution of left axillary metabolic adenopathy as well as marked reduction in the metabolic activity of the inguinal lymph nodes down to Deauville score 3.  Findings are consistent with a [place] complete metabolic response 12) 06/26/2021: CT scan showed prominent non pathologically enlarged inguinal lymph nodes, without evidence of adenopathy in the chest, abdomen or pelvis. No splenomegaly 13) 07/17/2022: CT scan showed no pathologically enlarged inguinal lymph nodes, scan is without evidence of adenopathy in the chest, abdomen or pelvis. No splenomegaly  Interval History:  Reynold Mantell 59 y.o. male with medical history significant for Anaplastic T Cell Lymphoma who presents for a follow up visit. The patient's last visit was on 07/29/2023. In the interim since the last visit the patient has had no changes in his health.  On exam today Mr. Mineo notes that 2026 is off to a good start.  He reports overall he has been  feeling great.  His energy and appetite are both strong.  He said no major changes in his health.  He did have some cortisone shots in his hips recently and reports this helped considerably with his arthritis pain.  He reports he is had no issues with runny nose, sore throat, cough.  He denies any fevers, chills, sweats.  He did not get a flu shot this year.  He denies any bumps or lumps concerning for lymphadenopathy.  He reports his weight has been steady.  He will getting another CT scan coming up in June 2026.  Overall he feels well and has no questions concerns or complaints today.  A full 10 point ROS is otherwise negative.  MEDICAL HISTORY:  Past Medical History:  Diagnosis Date   Cancer [facility])    lymph nodes   Chronic lower back pain 15 years   Medical history non-contributory    Tooth pain    no abscess or drainage per pt 02-01-2020    SURGICAL HISTORY: Past Surgical History:  Procedure Laterality Date   INCISION AND DRAINAGE PERIRECTAL ABSCESS  06/16/2017   INCISION AND DRAINAGE PERIRECTAL ABSCESS N/A 06/16/2017   Procedure: IRRIGATION AND DEBRIDEMENT PERIRECTAL ABSCESS;  Surgeon: Vernetta Berg, MD;  Location: [facility] OR;  Service: General;  Laterality: N/A;   INGUINAL HERNIA REPAIR Right 2000s   IR IMAGING GUIDED PORT INSERTION  02/15/2020   IR REMOVAL TUN ACCESS W/ PORT W/O FL MOD SED  09/14/2020   LYMPH NODE BIOPSY Right 02/02/2020   Procedure: EXCISIONAL INGUINAL LYMPH NODE BIOPSY;  Surgeon: Debby Hila, MD;  Location:  [facility] [place];  Service: General;  Laterality: Right;   PROCTOSCOPY  06/16/2017   Procedure: RIGID PROCTOSCOPY;  Surgeon: Vernetta Berg, MD;  Location[facility] OR;  Service: General;;    SOCIAL HISTORY: Social History   Socioeconomic History   Marital status: Married    Spouse name: Not on file   Number of children: Not on file   Years of education: Not on file   Highest education level: Not on file  Occupational History   Not on file   Tobacco Use   Smoking status: Former    Current packs/day: 0.00    Average packs/day: 0.1 packs/day for 37.0 years (4.4 ttl pk-yrs)    Types: Cigarettes    Start date: 05/20/1980    Quit date: 05/20/2017    Years since quitting: 6.7   Smokeless tobacco: Never  Vaping Use   Vaping status: Never Used  Substance and Sexual Activity   Alcohol use: Not Currently    Comment: rare   Drug use: Yes    Types: Marijuana    Comment: 02-01-2020 once a day; used in last 24 hours   Sexual activity: Yes  Other Topics Concern   Not on file  Social History Narrative   Not on file   Social Drivers of Health   Tobacco Use: Medium Risk (06/07/2023)   Patient History    Smoking Tobacco Use: Former    Smokeless Tobacco Use: Never    Passive Exposure: Not on Actuary Strain: Not on file  Food Insecurity: Not on file  Transportation Needs: Not on file  Physical Activity: Not on file  Stress: Not on file  Social Connections: Not on file  Intimate Partner Violence: Not on file  Depression (EYV7-0): Not on file  Alcohol Screen: Not on file  Housing: Not on file  Utilities: Not on file  Health Literacy: Not on file    FAMILY HISTORY: Family History  Problem Relation Age of Onset   Diabetes Neg Hx     ALLERGIES:  has no known allergies.  MEDICATIONS:  Current Outpatient Medications  Medication Sig Dispense Refill   lidocaine  (LIDODERM ) 5 % Place 1 patch onto the skin daily. Remove & Discard patch within 12 hours or as directed by MD 30 patch 0   methocarbamol  (ROBAXIN ) 500 MG tablet Take 1 tablet (500 mg total) by mouth 2 (two) times daily. 20 tablet 0   No current facility-administered medications for this visit.    REVIEW OF SYSTEMS:   Constitutional: ( - ) fevers, ( - )  chills , ( - ) night sweats Eyes: ( - ) blurriness of vision, ( - ) double vision, ( - ) watery eyes Ears, nose, mouth, throat, and face: ( - ) mucositis, ( - ) sore throat Respiratory: ( - )  cough, ( - ) dyspnea, ( - ) wheezes Cardiovascular: ( - ) palpitation, ( - ) chest discomfort, ( - ) lower extremity swelling Gastrointestinal:  ( - ) nausea, ( - ) heartburn, ( - ) change in bowel habits Skin: ( - ) abnormal skin rashes Lymphatics: ( - ) new lymphadenopathy, ( - ) easy bruising Neurological: ( - ) numbness, ( - ) tingling, ( - ) new weaknesses Behavioral/Psych: ( - ) mood change, ( - ) new changes  All other systems were reviewed with the patient and are negative.  PHYSICAL EXAMINATION: ECOG PERFORMANCE STATUS: 0 - Asymptomatic  Vitals:   02/03/24 1058  BP: 114/82  Pulse: 70  Resp: 16  Temp: (!) 97.4 F (36.3 C)  SpO2: 100%   Filed Weights   02/03/24 1058  Weight: 139 lb (63 kg)    GENERAL: well appearing middle aged African American male. alert, no distress and comfortable SKIN: skin color, texture, turgor are normal, no rashes or significant lesions EYES: conjunctiva are pink and non-injected, sclera clear LYMPH: No lymphadenopathy palpable in the cervical lymph nodes.  No palpable lymphadenopathy in the groin LUNGS: clear to auscultation and percussion with normal breathing effort HEART: regular rate & rhythm and no murmurs and no lower extremity edema Musculoskeletal: no cyanosis of digits and no clubbing  PSYCH: alert & oriented x 3, fluent speech NEURO: no focal motor/sensory deficits  LABORATORY DATA:  I have reviewed the data as listed    Latest Ref Rng & Units 02/03/2024   10:47 AM 07/29/2023   10:40 AM 06/07/2023   12:46 PM  CBC  WBC 4.0 - 10.5 K/uL 4.6  5.7  4.7   Hemoglobin 13.0 - 17.0 g/dL 85.5  85.4  86.1   Hematocrit 39.0 - 52.0 % 42.4  42.5  40.6   Platelets 150 - 400 K/uL 227  241  242        Latest Ref Rng & Units 02/03/2024   10:47 AM 07/29/2023   10:40 AM 06/07/2023   12:46 PM  CMP  Glucose 70 - 99 mg/dL 96  91  882   BUN 6 - 20 mg/dL 13  19  15    Creatinine 0.61 - 1.24 mg/dL 8.98  9.02  9.11   Sodium 135 - 145 mmol/L 142  141   138   Potassium 3.5 - 5.1 mmol/L 4.8  4.9  3.8   Chloride 98 - 111 mmol/L 103  105  103   CO2 22 - 32 mmol/L 30  31  26    Calcium 8.9 - 10.3 mg/dL 9.6  9.3  9.3   Total Protein 6.5 - 8.1 g/dL 7.9  7.2  7.3   Total Bilirubin 0.0 - 1.2 mg/dL 0.6  0.5  1.0   Alkaline Phos 38 - 126 U/L 69  51  51   AST 15 - 41 U/L 23  18  21    ALT 0 - 44 U/L 16  14  12      RADIOGRAPHIC STUDIES: No results found.   ASSESSMENT & PLAN Omid Deardorff 59 y.o. male with no significant medical history presents for evaluation of Anaplastic T Cell Lymphoma.  Review the labs, review the records, discussion with the patient the findings are most consistent with Anaplastic T Cell Lymphoma.     PET CT scan performed on 02/24/2020 confirmed Stage III disease with involvement of the axillary and inguinal lymph nodes. He has also had a port placed and echocardiogram performed. Post treatment PET CT scan on 06/29/2020 showed Deauville 3 inguinal lymph nodes, returned to normal size. Near complete resolution of axillary adenopathy. CT scan on 01/09/2021 showed stable examination including prominent inguinal lymph nodes without pathologically enlarged lymph nodes within the chest, abdomen, or pelvis and no splenomegaly.  The treatment of choice for this patient's cancer would be BV-CHP chemotherapy.  This regimen consists of doxorubicin  50 mg per metered squared on day 1, cyclophosphamide  750 mg per metered squared on day 1, and brentuximab 1.8 mg/kg on day 1 of a 21-day cycle.  The patient also received prednisone  60mg  PO on days 1-5.  He completed 6 cycles.   # Anaplastic T  Cell Lymphoma, CD 30 + ALK negative, Stage III  -- excisional biopsy now shows an anaplastic large cell lymphoma.  --port had been placed, TTE shows adequate heart function for anthracycline therapy.  --initial PET CT scan shows involvement of the inguinal/axillary lymph nodes. Findings consistent with Stage III disease.  --patient completed 6 cycles of  BV-CHP  chemotherapy as noted above. Cycle 1 Day 1 of treatment started on 02/23/2020.  --post treatment PET CT scan on 06/29/2020 showed Deauville 3 inguinal lymph nodes, returned to normal size. Near complete resolution of axillary adenopathy.  Plan: --Per NCCN recommendations, will follow the patient q 6 months with labs and q 12 months for CT C/A/P x 3 years (after first 2 years of monitoring). Next CT scan due in June 2026. Last CT scan to be done on June 2027.  --labs today show white blood cell count 4.6, Hgb 14.4, MCV 92.2, Plt 227.   --no signs of symptoms concerning for recurrent disease.  --RTC in 6 months for continue monitoring.  Continue every 6 month clinic visits with yearly CT scans.  Orders Placed This Encounter  Procedures   CT CHEST ABDOMEN PELVIS W CONTRAST    Standing Status:   Future    Expected Date:   07/18/2024    Expiration Date:   02/03/2025    If indicated for the ordered procedure, I authorize the administration of contrast media per Radiology protocol:   Yes    Does the patient have a contrast media/X-ray dye allergy?:   No    Preferred imaging location?:   [facility]    If indicated for the ordered procedure, I authorize the administration of oral contrast media per Radiology protocol:   Yes   All questions were answered. The patient knows to call the clinic with any problems, questions or concerns.  A total of more than 25 minutes were spent on this encounter and over half of that time was spent on counseling and coordination of care as outlined above.   Norleen IVAR Kidney, MD Department of Hematology/Oncology [facility] Phone: [phone] Pager: [phone] Email: [email or web address]  02/04/2024 8:49 AM   Literature Support:  Traci GORMAN Forts OA, Pro B, Illidge T, Fanale M, Advani R, Haugan, Crocker, Morschhauser F, Domingo-Domenech E, Rossi G, Kim WS, Feldman T, Lennard A, Belada D, Ills ,  Tobinai K, Tsukasaki K, [place] SP, Shustov A, Httmann A, Warren, Yuen S, Iyer S, Zinzani PL, Hua Z, Little M, Melanee GORMAN Larwance JINNY Arthurine T, Trmper L; ECHELON-2 Study Group. Brentuximab vedotin  with chemotherapy for CD30-positive peripheral T-cell lymphoma (ECHELON-2): a global, double-blind, randomised, phase 3 trial. Lancet. 2019 Jan 19;393(10168):229-240.   --Median progression-free survival was 482 months (95% CI 352-not evaluable) in the A+CHP group and 208 months (127-476) in the CHOP group (hazard ratio 071 [95% CI 054-093], p=[id number]).   --Front-line treatment with A+CHP is superior to CHOP for patients with CD30-positive peripheral T-cell lymphomas as shown by a significant improvement in progression-free survival and overall survival with a manageable safety profile. "

## 2024-02-03 ENCOUNTER — Inpatient Hospital Stay: Payer: MEDICAID | Admitting: Hematology and Oncology

## 2024-02-03 ENCOUNTER — Inpatient Hospital Stay: Payer: MEDICAID | Attending: Hematology and Oncology

## 2024-02-03 ENCOUNTER — Encounter: Payer: Self-pay | Admitting: Hematology and Oncology

## 2024-02-03 VITALS — BP 114/82 | HR 70 | Temp 97.4°F | Resp 16 | Wt 139.0 lb

## 2024-02-03 DIAGNOSIS — Z95828 Presence of other vascular implants and grafts: Secondary | ICD-10-CM

## 2024-02-03 DIAGNOSIS — Z87891 Personal history of nicotine dependence: Secondary | ICD-10-CM | POA: Insufficient documentation

## 2024-02-03 DIAGNOSIS — C8661 Primary cutaneous cd30-positive t-cell proliferations, in remission: Secondary | ICD-10-CM | POA: Insufficient documentation

## 2024-02-03 DIAGNOSIS — C8460 Anaplastic large cell lymphoma, ALK-positive, unspecified site: Secondary | ICD-10-CM | POA: Diagnosis not present

## 2024-02-03 DIAGNOSIS — Z9221 Personal history of antineoplastic chemotherapy: Secondary | ICD-10-CM | POA: Insufficient documentation

## 2024-02-03 DIAGNOSIS — C8442 Peripheral T-cell lymphoma, not classified, intrathoracic lymph nodes: Secondary | ICD-10-CM | POA: Diagnosis not present

## 2024-02-03 LAB — CBC WITH DIFFERENTIAL (CANCER CENTER ONLY)
Abs Immature Granulocytes: 0 K/uL (ref 0.00–0.07)
Basophils Absolute: 0 K/uL (ref 0.0–0.1)
Basophils Relative: 1 %
Eosinophils Absolute: 0.1 K/uL (ref 0.0–0.5)
Eosinophils Relative: 2 %
HCT: 42.4 % (ref 39.0–52.0)
Hemoglobin: 14.4 g/dL (ref 13.0–17.0)
Immature Granulocytes: 0 %
Lymphocytes Relative: 58 %
Lymphs Abs: 2.7 K/uL (ref 0.7–4.0)
MCH: 31.3 pg (ref 26.0–34.0)
MCHC: 34 g/dL (ref 30.0–36.0)
MCV: 92.2 fL (ref 80.0–100.0)
Monocytes Absolute: 0.4 K/uL (ref 0.1–1.0)
Monocytes Relative: 9 %
Neutro Abs: 1.4 K/uL — ABNORMAL LOW (ref 1.7–7.7)
Neutrophils Relative %: 30 %
Platelet Count: 227 K/uL (ref 150–400)
RBC: 4.6 MIL/uL (ref 4.22–5.81)
RDW: 14.5 % (ref 11.5–15.5)
WBC Count: 4.6 K/uL (ref 4.0–10.5)
nRBC: 0 % (ref 0.0–0.2)

## 2024-02-03 LAB — CMP (CANCER CENTER ONLY)
ALT: 16 U/L (ref 0–44)
AST: 23 U/L (ref 15–41)
Albumin: 4.3 g/dL (ref 3.5–5.0)
Alkaline Phosphatase: 69 U/L (ref 38–126)
Anion gap: 10 (ref 5–15)
BUN: 13 mg/dL (ref 6–20)
CO2: 30 mmol/L (ref 22–32)
Calcium: 9.6 mg/dL (ref 8.9–10.3)
Chloride: 103 mmol/L (ref 98–111)
Creatinine: 1.01 mg/dL (ref 0.61–1.24)
GFR, Estimated: >60 mL/min
Glucose, Bld: 96 mg/dL (ref 70–99)
Potassium: 4.8 mmol/L (ref 3.5–5.1)
Sodium: 142 mmol/L (ref 135–145)
Total Bilirubin: 0.6 mg/dL (ref 0.0–1.2)
Total Protein: 7.9 g/dL (ref 6.5–8.1)

## 2024-02-03 LAB — LACTATE DEHYDROGENASE: LDH: 176 U/L (ref 105–235)

## 2024-02-04 ENCOUNTER — Telehealth: Payer: Self-pay | Admitting: Hematology and Oncology

## 2024-02-04 ENCOUNTER — Encounter: Payer: Self-pay | Admitting: Hematology and Oncology

## 2024-02-04 NOTE — Telephone Encounter (Signed)
 Left a mssage for pt about scheduled appt

## 2024-08-03 ENCOUNTER — Inpatient Hospital Stay: Admitting: Hematology and Oncology

## 2024-08-03 ENCOUNTER — Inpatient Hospital Stay
